# Patient Record
Sex: Female | Born: 1980 | Race: Black or African American | Hispanic: No | Marital: Single | Smoking: Never smoker
Health system: Southern US, Community
[De-identification: ages and names within clinical notes are randomized; demographics above are authoritative.]

## PROBLEM LIST (undated history)

## (undated) ENCOUNTER — Emergency Department (HOSPITAL_COMMUNITY): Admission: EM | Source: Home / Self Care | Attending: Emergency Medicine | Admitting: Emergency Medicine

## (undated) DIAGNOSIS — R7303 Prediabetes: Secondary | ICD-10-CM

## (undated) DIAGNOSIS — D649 Anemia, unspecified: Secondary | ICD-10-CM

## (undated) DIAGNOSIS — R87619 Unspecified abnormal cytological findings in specimens from cervix uteri: Secondary | ICD-10-CM

## (undated) DIAGNOSIS — K829 Disease of gallbladder, unspecified: Secondary | ICD-10-CM

## (undated) DIAGNOSIS — R32 Unspecified urinary incontinence: Secondary | ICD-10-CM

## (undated) DIAGNOSIS — R0602 Shortness of breath: Secondary | ICD-10-CM

## (undated) DIAGNOSIS — E559 Vitamin D deficiency, unspecified: Secondary | ICD-10-CM

## (undated) DIAGNOSIS — IMO0002 Reserved for concepts with insufficient information to code with codable children: Secondary | ICD-10-CM

## (undated) DIAGNOSIS — L0591 Pilonidal cyst without abscess: Secondary | ICD-10-CM

## (undated) HISTORY — DX: Unspecified urinary incontinence: R32

## (undated) HISTORY — DX: Vitamin D deficiency, unspecified: E55.9

## (undated) HISTORY — DX: Reserved for concepts with insufficient information to code with codable children: IMO0002

## (undated) HISTORY — DX: Anemia, unspecified: D64.9

## (undated) HISTORY — DX: Shortness of breath: R06.02

## (undated) HISTORY — DX: Disease of gallbladder, unspecified: K82.9

## (undated) HISTORY — DX: Pilonidal cyst without abscess: L05.91

## (undated) HISTORY — PX: LEEP: SHX91

## (undated) HISTORY — DX: Prediabetes: R73.03

## (undated) HISTORY — DX: Unspecified abnormal cytological findings in specimens from cervix uteri: R87.619

---
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
## 2000-09-02 ENCOUNTER — Other Ambulatory Visit: Admission: RE | Admit: 2000-09-02 | Discharge: 2000-09-02 | Payer: Self-pay | Admitting: Obstetrics and Gynecology

## 2001-05-31 ENCOUNTER — Ambulatory Visit (HOSPITAL_COMMUNITY): Admission: RE | Admit: 2001-05-31 | Discharge: 2001-05-31 | Payer: Self-pay | Admitting: Family Medicine

## 2001-05-31 ENCOUNTER — Encounter: Payer: Self-pay | Admitting: Family Medicine

## 2005-02-17 ENCOUNTER — Other Ambulatory Visit: Admission: RE | Admit: 2005-02-17 | Discharge: 2005-02-17 | Payer: Self-pay | Admitting: *Deleted

## 2005-06-14 ENCOUNTER — Emergency Department (HOSPITAL_COMMUNITY): Admission: EM | Admit: 2005-06-14 | Discharge: 2005-06-14 | Payer: Self-pay | Admitting: Emergency Medicine

## 2005-06-17 ENCOUNTER — Ambulatory Visit (HOSPITAL_COMMUNITY): Admission: RE | Admit: 2005-06-17 | Discharge: 2005-06-17 | Payer: Self-pay | Admitting: Family Medicine

## 2005-06-24 ENCOUNTER — Ambulatory Visit (HOSPITAL_COMMUNITY): Admission: RE | Admit: 2005-06-24 | Discharge: 2005-06-24 | Payer: Self-pay | Admitting: Family Medicine

## 2006-02-02 ENCOUNTER — Other Ambulatory Visit: Admission: RE | Admit: 2006-02-02 | Discharge: 2006-02-02 | Payer: Self-pay | Admitting: Obstetrics & Gynecology

## 2006-05-18 ENCOUNTER — Ambulatory Visit (HOSPITAL_COMMUNITY): Admission: RE | Admit: 2006-05-18 | Discharge: 2006-05-18 | Payer: Self-pay | Admitting: Family Medicine

## 2006-08-19 ENCOUNTER — Other Ambulatory Visit: Admission: RE | Admit: 2006-08-19 | Discharge: 2006-08-19 | Payer: Self-pay | Admitting: Obstetrics & Gynecology

## 2007-02-04 ENCOUNTER — Other Ambulatory Visit: Admission: RE | Admit: 2007-02-04 | Discharge: 2007-02-04 | Payer: Self-pay | Admitting: Obstetrics & Gynecology

## 2007-04-04 ENCOUNTER — Emergency Department (HOSPITAL_COMMUNITY): Admission: EM | Admit: 2007-04-04 | Discharge: 2007-04-04 | Payer: Self-pay | Admitting: Emergency Medicine

## 2007-08-04 IMAGING — CT CT PELVIS W/ CM
1 of 3 series · 13 of 32 positions shown, 19 images · IV contrast (CONTRAST)
Comparison: There are no prior studies available for comparison.

CLINICAL DATA: Pain in the right lower quadrant and mid abdomen, please evaluate.  
 ABDOMEN CT WITH CONTRAST:
TECHNIQUE: Multidetector CT imaging of the abdomen was performed following the standard protocol during bolus administration of intravenous contrast.
 Contrast: 100 cc Omnipaque 300.
TECHNIQUE: Multidetector CT imaging of the pelvis was performed following the standard protocol during bolus administration of intravenous contrast.

[Series 4890: — · axial · 0.53mm/px · z∈[+1342,+1708]mm · 13 of 87 slices shown, 19 images]
[im 7/87  soft-tissue]
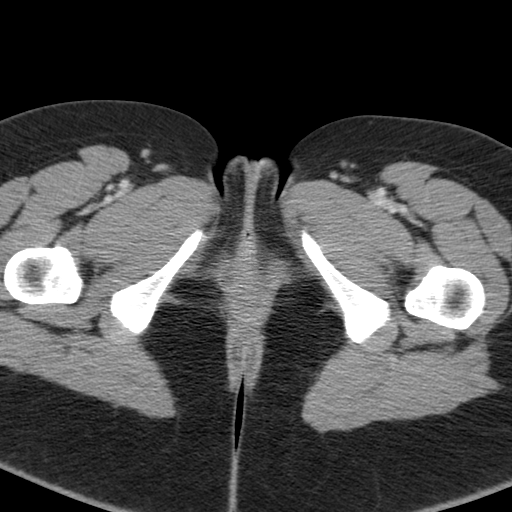
[im 7/87  bone]
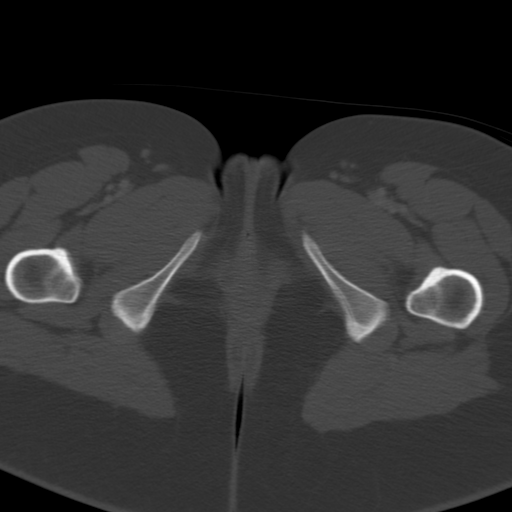
[im 13/87  soft-tissue]
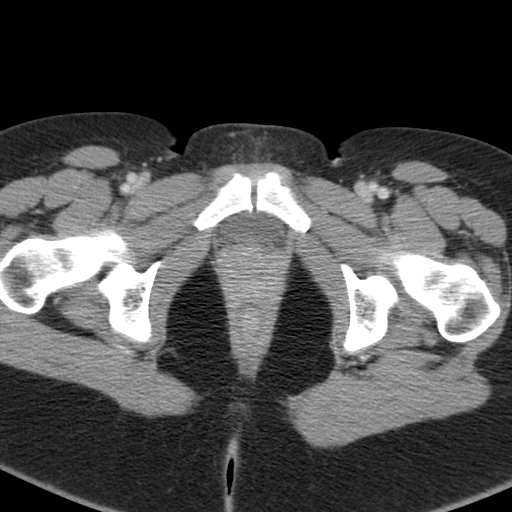
[im 19/87  soft-tissue]
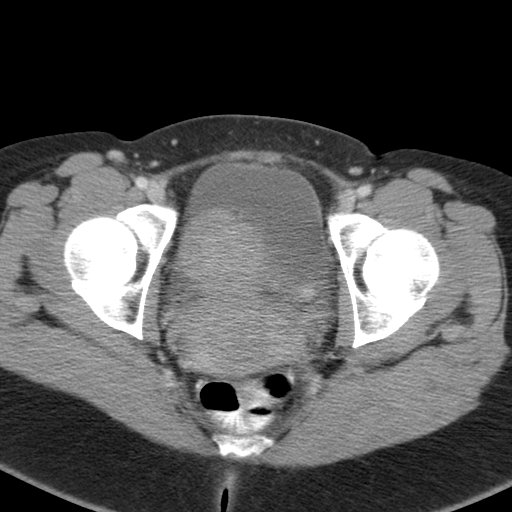
[im 25/87  soft-tissue]
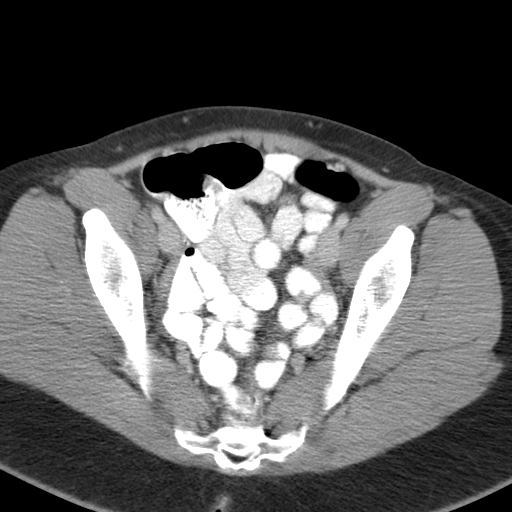
[im 31/87  soft-tissue]
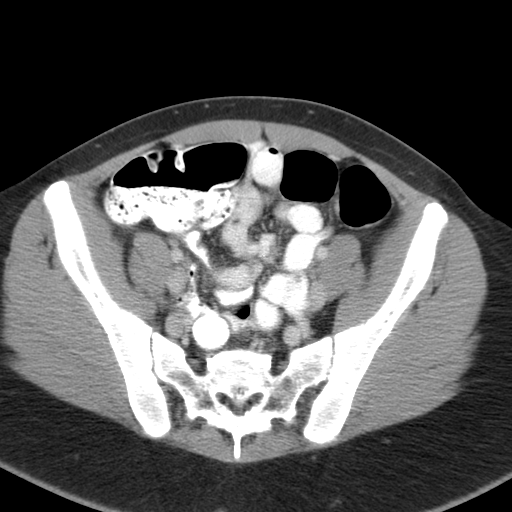
[im 37/87  soft-tissue]
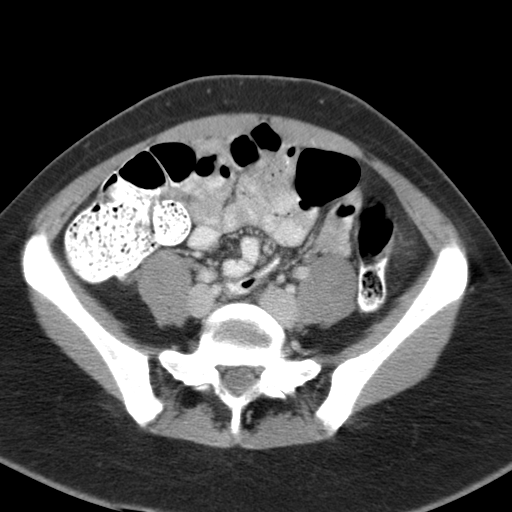
[im 44/87  soft-tissue]
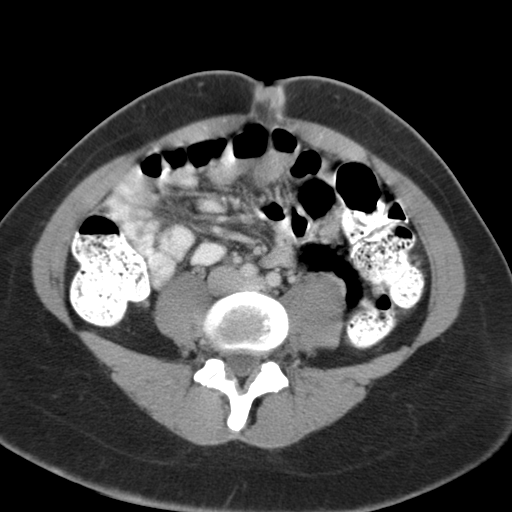
[im 50/87  soft-tissue]
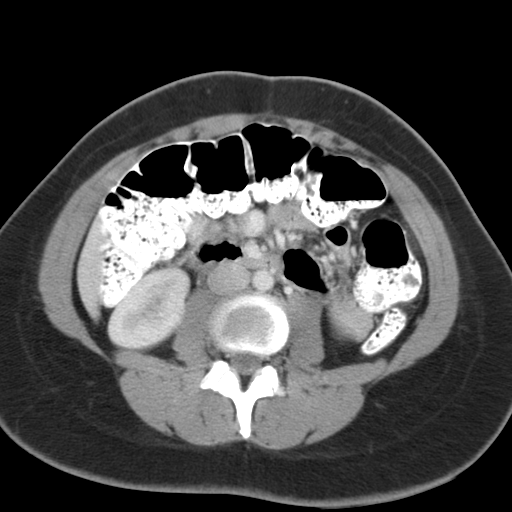
[im 56/87  soft-tissue]
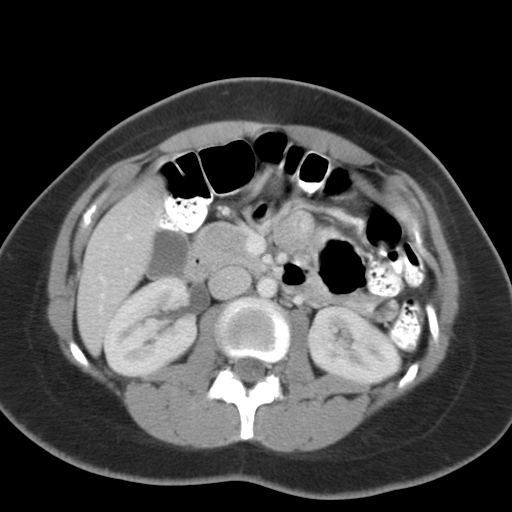
[im 56/87  bone]
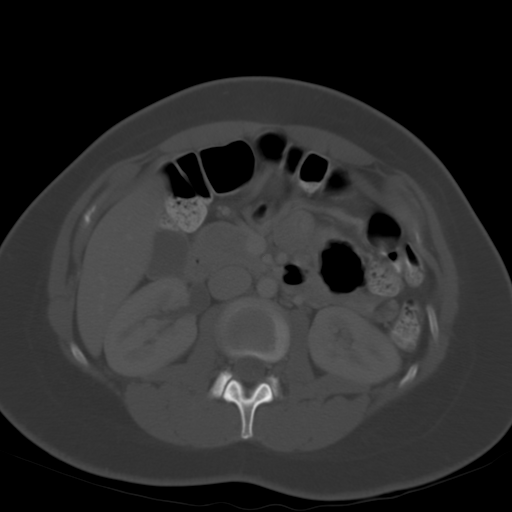
[im 62/87  soft-tissue]
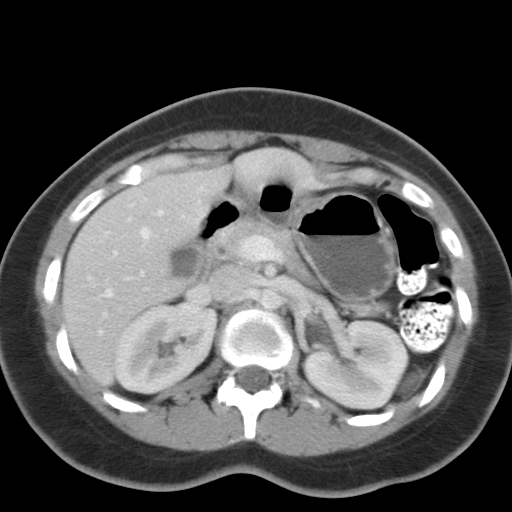
[im 62/87  lung]
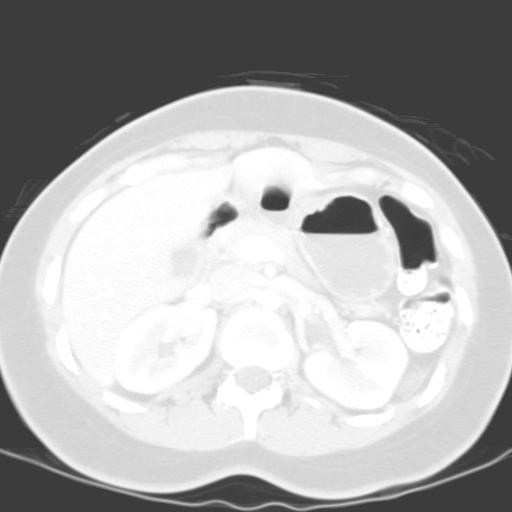
[im 68/87  soft-tissue]
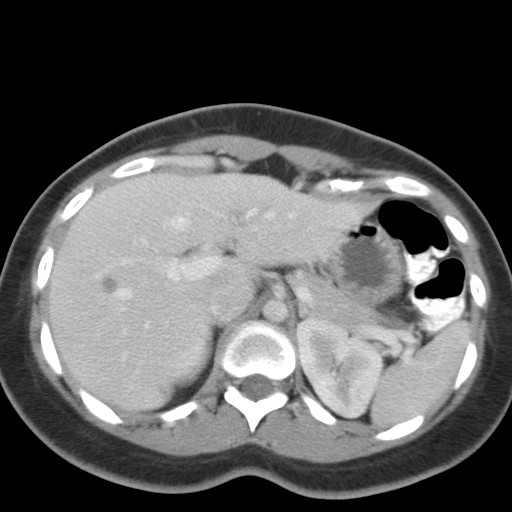
[im 68/87  lung]
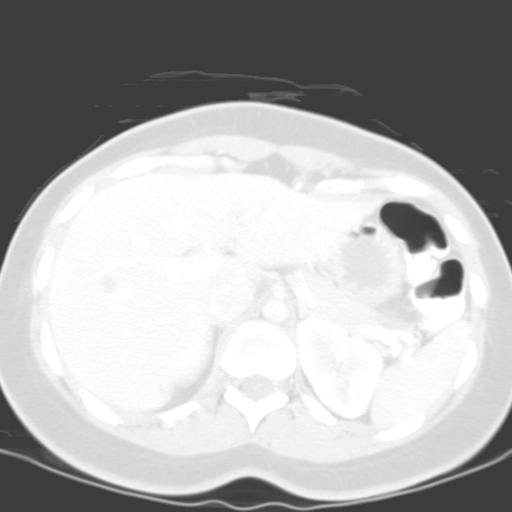
[im 74/87  soft-tissue]
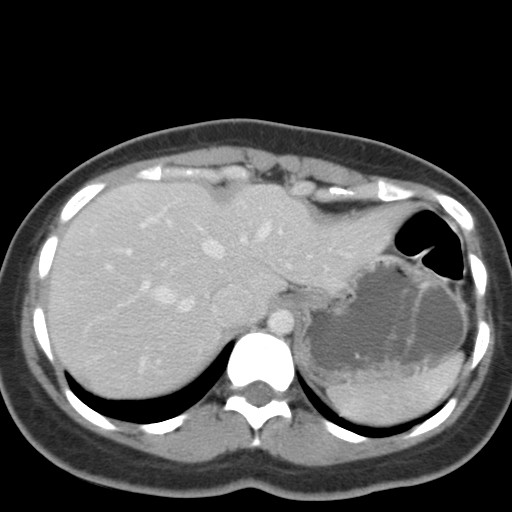
[im 74/87  lung]
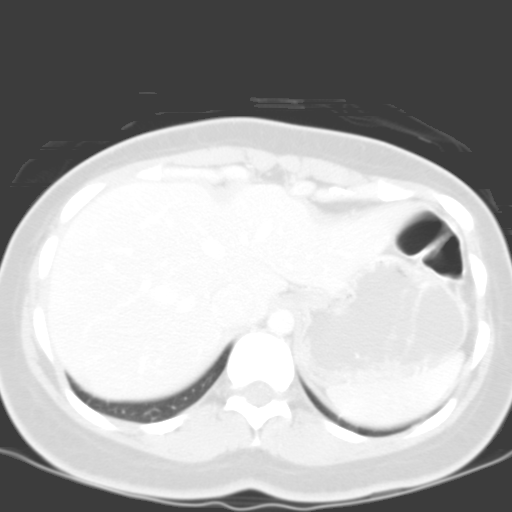
[im 80/87  soft-tissue]
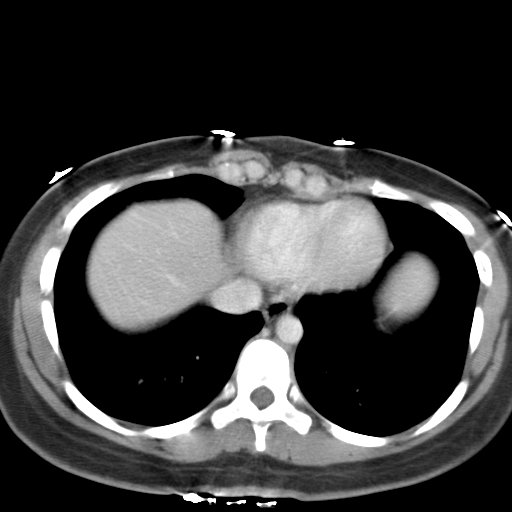
[im 80/87  lung]
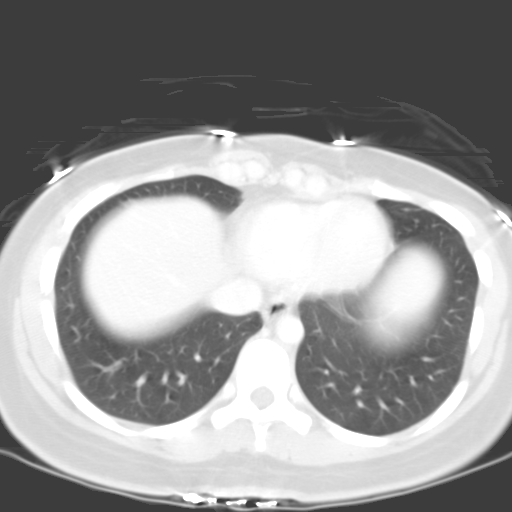

[13 of 32 positions shown; findings below may reference images not displayed]

FINDINGS: There is a circumscribed hypodense lesion associated with the right lobe of the liver, measuring 8 mm in diameter on image #20.  This is too small for accurate density measurements with partial volume averaging present.  Ultrasound of the liver may be helpful to determine if this represents a cyst.  There are no other liver lesions.  The spleen, pancreas, kidneys, and adrenal glands have a normal appearance.  There is no retroperitoneal adenopathy and the abdominal aorta is normal in size.
IMPRESSION: 8 mm in size hypodense lesion within the right lobe of the liver, which is incompletely evaluated on this study.  Ultrasound of the liver may be helpful to determine if this represents a cyst.  Otherwise, negative CT abdomen with IV contrast.
 PELVIS CT WITH CONTRAST:
FINDINGS: The appendix is visualized and fills with contrast and there is no evidence for appendicitis.  There is no pelvic mass or adenopathy and there is no significant free pelvic fluid.  There are no findings to account for the patient?s abdominal pain.  There is no evidence by CT scan for Crohn?s disease.
IMPRESSION: Negative CT of the pelvis with IV contrast.

## 2008-02-10 ENCOUNTER — Other Ambulatory Visit: Admission: RE | Admit: 2008-02-10 | Discharge: 2008-02-10 | Payer: Self-pay | Admitting: Obstetrics & Gynecology

## 2008-06-14 ENCOUNTER — Emergency Department (HOSPITAL_COMMUNITY): Admission: EM | Admit: 2008-06-14 | Discharge: 2008-06-14 | Payer: Self-pay | Admitting: Emergency Medicine

## 2009-05-23 ENCOUNTER — Emergency Department (HOSPITAL_COMMUNITY): Admission: EM | Admit: 2009-05-23 | Discharge: 2009-05-23 | Payer: Self-pay | Admitting: Emergency Medicine

## 2010-01-02 ENCOUNTER — Emergency Department (HOSPITAL_COMMUNITY)
Admission: EM | Admit: 2010-01-02 | Discharge: 2010-01-03 | Payer: Self-pay | Source: Home / Self Care | Admitting: Emergency Medicine

## 2010-03-18 LAB — DIFFERENTIAL
Eosinophils Absolute: 0.2 10*3/uL (ref 0.0–0.7)
Eosinophils Relative: 1 % (ref 0–5)
Lymphocytes Relative: 23 % (ref 12–46)
Lymphs Abs: 2.7 10*3/uL (ref 0.7–4.0)
Monocytes Absolute: 0.7 10*3/uL (ref 0.1–1.0)
Monocytes Relative: 6 % (ref 3–12)

## 2010-03-18 LAB — LIPASE, BLOOD: Lipase: 20 U/L (ref 11–59)

## 2010-03-18 LAB — D-DIMER, QUANTITATIVE: D-Dimer, Quant: 0.23 ug/mL-FEU (ref 0.00–0.48)

## 2010-03-18 LAB — CBC
Hemoglobin: 11.4 g/dL — ABNORMAL LOW (ref 12.0–15.0)
MCH: 29.5 pg (ref 26.0–34.0)
MCHC: 33.8 g/dL (ref 30.0–36.0)
MCV: 87.3 fL (ref 78.0–100.0)
WBC: 11.9 10*3/uL — ABNORMAL HIGH (ref 4.0–10.5)

## 2010-03-18 LAB — COMPREHENSIVE METABOLIC PANEL
BUN: 7 mg/dL (ref 6–23)
Glucose, Bld: 92 mg/dL (ref 70–99)
Potassium: 3.8 mEq/L (ref 3.5–5.1)
Sodium: 141 mEq/L (ref 135–145)
Total Protein: 7.4 g/dL (ref 6.0–8.3)

## 2010-03-25 LAB — URINALYSIS, ROUTINE W REFLEX MICROSCOPIC
Bilirubin Urine: NEGATIVE
Ketones, ur: NEGATIVE mg/dL
Nitrite: NEGATIVE
Protein, ur: NEGATIVE mg/dL

## 2010-03-25 LAB — WET PREP, GENITAL
Trich, Wet Prep: NONE SEEN
Yeast Wet Prep HPF POC: NONE SEEN

## 2010-03-25 LAB — URINE MICROSCOPIC-ADD ON

## 2010-03-25 LAB — POCT PREGNANCY, URINE: Preg Test, Ur: NEGATIVE

## 2010-03-25 LAB — GC/CHLAMYDIA PROBE AMP, GENITAL: Chlamydia, DNA Probe: NEGATIVE

## 2010-04-15 LAB — URINALYSIS, ROUTINE W REFLEX MICROSCOPIC
Leukocytes, UA: NEGATIVE
Protein, ur: NEGATIVE mg/dL
Specific Gravity, Urine: 1.02 (ref 1.005–1.030)
Urobilinogen, UA: 0.2 mg/dL (ref 0.0–1.0)
pH: 7 (ref 5.0–8.0)

## 2010-04-15 LAB — BASIC METABOLIC PANEL
CO2: 28 mEq/L (ref 19–32)
Chloride: 107 mEq/L (ref 96–112)
Creatinine, Ser: 0.65 mg/dL (ref 0.4–1.2)
GFR calc Af Amer: >60 mL/min (ref >60)
GFR calc non Af Amer: >60 mL/min (ref >60)

## 2010-04-15 LAB — PREGNANCY, URINE: Preg Test, Ur: NEGATIVE

## 2010-04-15 LAB — DIFFERENTIAL
Eosinophils Absolute: 0.1 10*3/uL (ref 0.0–0.7)
Eosinophils Relative: 1 % (ref 0–5)
Monocytes Relative: 7 % (ref 3–12)

## 2010-04-15 LAB — URINE MICROSCOPIC-ADD ON

## 2010-04-15 LAB — CBC
HCT: 33 % — ABNORMAL LOW (ref 36.0–46.0)
WBC: 9.2 10*3/uL (ref 4.0–10.5)

## 2010-09-30 LAB — CULTURE, ROUTINE-ABSCESS

## 2011-04-19 ENCOUNTER — Encounter (HOSPITAL_COMMUNITY): Payer: Self-pay | Admitting: *Deleted

## 2011-04-19 ENCOUNTER — Emergency Department (HOSPITAL_COMMUNITY): Payer: Self-pay

## 2011-04-19 ENCOUNTER — Emergency Department (HOSPITAL_COMMUNITY)
Admission: EM | Admit: 2011-04-19 | Discharge: 2011-04-19 | Disposition: A | Discharge status: Home / Self Care | Payer: Self-pay | Attending: Emergency Medicine | Admitting: Emergency Medicine

## 2011-04-19 DIAGNOSIS — R110 Nausea: Secondary | ICD-10-CM | POA: Insufficient documentation

## 2011-04-19 DIAGNOSIS — R079 Chest pain, unspecified: Secondary | ICD-10-CM | POA: Insufficient documentation

## 2011-04-19 LAB — CBC
HCT: 33.7 % — ABNORMAL LOW (ref 36.0–46.0)
Hemoglobin: 11.2 g/dL — ABNORMAL LOW (ref 12.0–15.0)
MCV: 90.8 fL (ref 78.0–100.0)
RBC: 3.71 MIL/uL — ABNORMAL LOW (ref 3.87–5.11)
WBC: 10 10*3/uL (ref 4.0–10.5)

## 2011-04-19 LAB — DIFFERENTIAL
Eosinophils Relative: 3 % (ref 0–5)
Lymphocytes Relative: 22 % (ref 12–46)
Lymphs Abs: 2.2 10*3/uL (ref 0.7–4.0)
Monocytes Absolute: 0.7 10*3/uL (ref 0.1–1.0)
Monocytes Relative: 7 % (ref 3–12)

## 2011-04-19 LAB — BASIC METABOLIC PANEL
CO2: 28 mEq/L (ref 19–32)
Calcium: 9.8 mg/dL (ref 8.4–10.5)
Creatinine, Ser: 0.56 mg/dL (ref 0.50–1.10)
Glucose, Bld: 87 mg/dL (ref 70–99)

## 2011-04-19 MED ORDER — GI COCKTAIL ~~LOC~~
30.0000 mL | Freq: Once | ORAL | Status: AC
  Administered 2011-04-19: 30 mL via ORAL
  Filled 2011-04-19: qty 30

## 2011-04-19 MED ORDER — IBUPROFEN 800 MG PO TABS
800.0000 mg | ORAL_TABLET | Freq: Once | ORAL | Status: AC
  Administered 2011-04-19: 800 mg via ORAL
  Filled 2011-04-19: qty 1

## 2011-04-19 MED ORDER — OMEPRAZOLE 20 MG PO CPDR: 20.0000 mg | DELAYED_RELEASE_CAPSULE | Freq: Every day | ORAL | Status: DC

## 2011-04-19 NOTE — ED Notes (Signed)
Pt c/o intermittent sharp left chest pain since yesterday. Also c/o nausea. Pt states that the pain is worse with cough or deep breathing. Denies shortness of breath.

## 2011-04-19 NOTE — Discharge Instructions (Signed)
Chest Pain (Nonspecific) There is no evidence of heart attack or blood clot in the lung.  Follow up with your doctor this week. Return to the ED if you develop new or worsening symptoms. Chest pain has many causes. Your pain could be caused by something serious, such as a heart attack or a blood clot in the lungs. It could also be caused by something less serious, such as a chest bruise or a virus. Follow up with your doctor. More lab tests or other studies may be needed to find the cause of your pain. Most of the time, nonspecific chest pain will improve within 2 to 3 days of rest and mild pain medicine. HOME CARE  For chest bruises, you may put ice on the sore area for 15 to 20 minutes, 3 to 4 times a day. Do this only if it makes you or your child feel better.   Put ice in a plastic bag.   Place a towel between the skin and the bag.   Rest for the next 2 to 3 days.   Go back to work if the pain improves.   See your doctor if the pain lasts longer than 1 to 2 weeks.   Only take medicine as told by your doctor.   Quit smoking if you smoke.  GET HELP RIGHT AWAY IF:   There is more pain or pain that spreads to the arm, neck, jaw, back, or belly (abdomen).   You or your child has shortness of breath.   You or your child coughs more than usual or coughs up blood.   You or your child has very bad back or belly pain, feels sick to his or her stomach (nauseous), or throws up (vomits).   You or your child has very bad weakness.   You or your child passes out (faints).   You or your child has a temperature by mouth above 102 F (38.9 C), not controlled by medicine.  Any of these problems may be serious and may be an emergency. Do not wait to see if the problems will go away. Get medical help right away. Call your local emergency services 911 in [place] Do not drive yourself to the hospital. MAKE SURE YOU:   Understand these instructions.   Will watch this condition.   Will get help  right away if you or your child is not doing well or gets worse.  Document Released: 06/11/2007 Document Revised: 12/12/2010 Document Reviewed: 06/11/2007 [facility] Patient Information 2012 [place], [place].Chest Pain (Nonspecific) Chest pain has many causes. Your pain could be caused by something serious, such as a heart attack or a blood clot in the lungs. It could also be caused by something less serious, such as a chest bruise or a virus. Follow up with your doctor. More lab tests or other studies may be needed to find the cause of your pain. Most of the time, nonspecific chest pain will improve within 2 to 3 days of rest and mild pain medicine. HOME CARE  For chest bruises, you may put ice on the sore area for 15 to 20 minutes, 3 to 4 times a day. Do this only if it makes you or your child feel better.   Put ice in a plastic bag.   Place a towel between the skin and the bag.   Rest for the next 2 to 3 days.   Go back to work if the pain improves.   See your doctor if  the pain lasts longer than 1 to 2 weeks.   Only take medicine as told by your doctor.   Quit smoking if you smoke.  GET HELP RIGHT AWAY IF:   There is more pain or pain that spreads to the arm, neck, jaw, back, or belly (abdomen).   You or your child has shortness of breath.   You or your child coughs more than usual or coughs up blood.   You or your child has very bad back or belly pain, feels sick to his or her stomach (nauseous), or throws up (vomits).   You or your child has very bad weakness.   You or your child passes out (faints).   You or your child has a temperature by mouth above 102 F (38.9 C), not controlled by medicine.  Any of these problems may be serious and may be an emergency. Do not wait to see if the problems will go away. Get medical help right away. Call your local emergency services 911 in [place] Do not drive yourself to the hospital. MAKE SURE YOU:   Understand these instructions.    Will watch this condition.   Will get help right away if you or your child is not doing well or gets worse.  Document Released: 06/11/2007 Document Revised: 12/12/2010 Document Reviewed: 06/11/2007 [facility] Patient Information 2012 [place], [place].

## 2011-04-19 NOTE — ED Notes (Signed)
Patient transported to X-ray 

## 2011-04-19 NOTE — ED Notes (Signed)
MD at bedside. 

## 2011-04-19 NOTE — ED Provider Notes (Signed)
This chart was scribed for Glynn Octave, MD by Wallis Mart. The patient was seen in room APA08/APA08 and the patient's care was started at 2:37 PM.   CSN: [id number]  Arrival date & time 04/19/11  1421   First MD Initiated Contact with Patient 04/19/11 1434      Chief Complaint  Patient presents with  . Chest Pain    (Consider location/radiation/quality/duration/timing/severity/associated sxs/prior treatment) HPI  Katie Curtis is a 31 y.o. female who presents to the Emergency Department complaining of sudden onset, persistence of constant, gradually worsening, moderate Left sided Chest pain onset yesterday. The severity of the pain fluctuates. The pain radiates to her left arm and back. Pt had a similar episode several years ago and had inflamation of ribs and cartilage. Pt w/ no h/o heart problems, denies pain or swelling in legs, denies cough, fever.  Pt states that lying back and lying on her side worsens the pain and sitting up helps. Breathing deeply also worsens the pain. Pt c/o associated nausea. Denies any recent illness. There are no other associated symptoms and no other alleviating or aggravating factors. Pt is not on birth control. History reviewed. No pertinent past medical history.  History reviewed. No pertinent past surgical history.  History reviewed. No pertinent family history.  History  Substance Use Topics  . Smoking status: Never Smoker   . Smokeless tobacco: Not on file  . Alcohol Use: Yes     occasionally    OB History    Grav Para Term Preterm Abortions TAB SAB Ect Mult Living                  Review of Systems 10 Systems reviewed and all are negative for acute change except as noted in the HPI.   Allergies  Hydrocodone; Oxycodone; and Penicillins  Home Medications   Current Outpatient Rx  Name Route Sig Dispense Refill  . OMEPRAZOLE 20 MG PO CPDR Oral Take 1 capsule (20 mg total) by mouth daily. 30 capsule 0    BP 122/75  Pulse  78  Temp(Src) 98.5 F (36.9 C) (Oral)  Resp 20  Ht 5' 3.5" (1.613 m)  Wt 160 lb (72.576 kg)  BMI 27.90 kg/m2  SpO2 100%  LMP 03/29/2011  Physical Exam  Nursing note and vitals reviewed. Constitutional: She is oriented to person, place, and time. She appears well-developed and well-nourished. No distress.  HENT:  Head: Normocephalic and atraumatic.  Eyes: EOM are normal. Pupils are equal, round, and reactive to light.  Neck: Neck supple. No tracheal deviation present.  Cardiovascular: Normal rate and regular rhythm.   Pulmonary/Chest: Effort normal. No respiratory distress.       Chest pain not reproducible   Abdominal: Soft. She exhibits no distension.  Musculoskeletal: Normal range of motion. She exhibits no edema.  Neurological: She is alert and oriented to person, place, and time. No sensory deficit.       5/5 strength throughout, equal grip strength upper extremities.   Skin: Skin is warm and dry.  Psychiatric: She has a normal mood and affect. Her behavior is normal.    ED Course  Procedures (including critical care time) DIAGNOSTIC STUDIES: Oxygen Saturation is 100% on room air, normal by my interpretation.    COORDINATION OF CARE:  2:46 PM: Pt evaluated, physical examination complete.    Labs Reviewed  CBC - Abnormal; Notable for the following:    RBC 3.71 (*)    Hemoglobin 11.2 (*)  HCT 33.7 (*)    All other components within normal limits  DIFFERENTIAL  BASIC METABOLIC PANEL  D-DIMER, QUANTITATIVE  TROPONIN I   Dg Chest 2 View  04/19/2011  *RADIOLOGY REPORT*  Clinical Data: Chest pain  CHEST - 2 VIEW  Comparison: Chest radiograph 12/25/2009  Findings: Normal mediastinum and cardiac silhouette.  Normal pulmonary  vasculature.  No evidence of effusion, infiltrate, or pneumothorax.  No acute bony abnormality.  IMPRESSION: No acute cardiopulmonary process.  Original Report Authenticated By: Genevive Bi, M.D.     1. Chest pain       MDM    Intermittent but constant left-sided chest pain since yesterday associated with nausea, worse with coughing and deep breathing. No shortness of breath. No radiation of pain. Air travel 3 weeks ago.  CP not reproducible. History and exam not consistent with ACS.  CXR, GI cocktail, d-dimer.  D-dimer negative. EKG nonischemic. Chest pain has resolved with GI cocktail and Motrin. We'll empirically start PPI. Stable for outpatient followup with PCP   Date: 04/19/2011  Rate: 88  Rhythm: normal sinus rhythm  QRS Axis: normal  Intervals: normal  ST/T Wave abnormalities: normal  Conduction Disutrbances:none  Narrative Interpretation:   Old EKG Reviewed: unchanged    I personally performed the services described in this documentation, which was scribed in my presence.  The recorded information has been reviewed and considered.        Glynn Octave, MD 04/19/11 1600

## 2012-09-02 ENCOUNTER — Ambulatory Visit (INDEPENDENT_AMBULATORY_CARE_PROVIDER_SITE_OTHER): Payer: BC Managed Care – PPO | Admitting: Obstetrics & Gynecology

## 2012-09-02 ENCOUNTER — Encounter: Payer: Self-pay | Admitting: Obstetrics & Gynecology

## 2012-09-02 VITALS — BP 114/70 | HR 64 | Resp 16 | Ht 63.5 in | Wt 171.8 lb

## 2012-09-02 DIAGNOSIS — Z124 Encounter for screening for malignant neoplasm of cervix: Secondary | ICD-10-CM

## 2012-09-02 DIAGNOSIS — Z0000 Encounter for general adult medical examination without abnormal findings: Secondary | ICD-10-CM

## 2012-09-02 DIAGNOSIS — Z01419 Encounter for gynecological examination (general) (routine) without abnormal findings: Secondary | ICD-10-CM

## 2012-09-02 DIAGNOSIS — Z23 Encounter for immunization: Secondary | ICD-10-CM

## 2012-09-02 LAB — POCT URINALYSIS DIPSTICK
Bilirubin, UA: NEGATIVE
Ketones, UA: NEGATIVE
Leukocytes, UA: NEGATIVE
Nitrite, UA: NEGATIVE
Protein, UA: NEGATIVE

## 2012-09-02 MED ORDER — PHENTERMINE HCL 15 MG PO CAPS: 15.0000 mg | ORAL_CAPSULE | ORAL | Status: DC

## 2012-09-02 NOTE — Progress Notes (Signed)
32 y.o. G1P1 SingleAfrican AmericanF here for annual exam.  Cycles regular and light.  Lasts 3-4 days with spotting for another three days.  Not SA.  Grandmother died this summer.  Patient and her son helped care for her.  They didn't take any big trips this summer.  He will be 16 on Dec 22nd.  Doing drivers Ed right now.  Patient's last menstrual period was 08/11/2012.          Sexually active: no  The current method of family planning is none.    Exercising: no  some Smoker:  no  Health Maintenance: Pap:  08/21/11 WNL History of abnormal Pap:  yes MMG:  none Colonoscopy:  none BMD:   none TDaP:  2004 Screening Labs: none done, Hb today: 12.2, Urine today: negative   reports that she has never smoked. She has never used smokeless tobacco. She reports that she does not drink alcohol or use illicit drugs.  Past Medical History  Diagnosis Date  . Anemia   . Abnormal Pap smear   . Pilonidal cyst     Past Surgical History  Procedure Laterality Date  . Leep      CIN II    Current Outpatient Prescriptions  Medication Sig Dispense Refill  . Multiple Vitamins-Calcium (ONE-A-DAY WOMENS PO) Take by mouth. For energy daily      . clobetasol ointment (TEMOVATE) 0.05 % Apply topically. Apply twice daily as needed       No current facility-administered medications for this visit.    Family History  Problem Relation Age of Onset  . Lung cancer Paternal Grandmother     smoker  . Hypertension Father   . Hypertension Paternal Grandmother   . Hypertension Paternal Grandfather   . Congestive Heart Failure Father     had been on dialysis x 10 years  . Kidney failure Paternal Grandfather     ROS:  Pertinent items are noted in HPI.  Otherwise, a comprehensive ROS was negative.  Exam:   BP 114/70  Pulse 64  Resp 16  Ht 5' 3.5" (1.613 m)  Wt 171 lb 12.8 oz (77.928 kg)  BMI 29.95 kg/m2  LMP 08/11/2012  Weight change: +9lbs  Height: 5' 3.5" (161.3 cm)  Ht Readings from Last 3  Encounters:  09/02/12 5' 3.5" (1.613 m)  04/19/11 5' 3.5" (1.613 m)    General appearance: alert, cooperative and appears stated age Head: Normocephalic, without obvious abnormality, atraumatic Neck: no adenopathy, supple, symmetrical, trachea midline and thyroid normal to inspection and palpation Lungs: clear to auscultation bilaterally Breasts: normal appearance, no masses or tenderness Heart: regular rate and rhythm Abdomen: soft, non-tender; bowel sounds normal; no masses,  no organomegaly Extremities: extremities normal, atraumatic, no cyanosis or edema Skin: Skin color, texture, turgor normal. No rashes or lesions Lymph nodes: Cervical, supraclavicular, and axillary nodes normal. No abnormal inguinal nodes palpated Neurologic: Grossly normal   Pelvic: External genitalia:  no lesions              Urethra:  normal appearing urethra with no masses, tenderness or lesions              Bartholins and Skenes: normal                 Vagina: normal appearing vagina with normal color and discharge, no lesions              Cervix: no lesions  Pap taken: yes Bimanual Exam:  Uterus:  normal size, contour, position, consistency, mobility, non-tender              Adnexa: normal adnexa and no mass, fullness, tenderness               Rectovaginal: Confirms               Anus:  normal sphincter tone, no lesions  A:  Well Woman with normal exam Weight gain 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  #30/1RF. Tdap today. TSH. return annually or prn  An After Visit Summary was printed and given to the patient.

## 2012-09-02 NOTE — Addendum Note (Signed)
Addended by: Elisha Headland on: 09/02/2012 05:08 PM   Modules accepted: Orders

## 2012-09-02 NOTE — Patient Instructions (Signed)

## 2012-09-03 LAB — TSH: TSH: 0.763 u[IU]/mL (ref 0.350–4.500)

## 2012-09-03 LAB — HEMOGLOBIN, FINGERSTICK: Hemoglobin, fingerstick: 12.2 g/dL (ref 12.0–16.0)

## 2012-10-04 ENCOUNTER — Ambulatory Visit (INDEPENDENT_AMBULATORY_CARE_PROVIDER_SITE_OTHER): Payer: BC Managed Care – PPO | Admitting: Obstetrics & Gynecology

## 2012-10-04 ENCOUNTER — Encounter: Payer: Self-pay | Admitting: Obstetrics & Gynecology

## 2012-10-04 VITALS — BP 122/72 | HR 60 | Resp 16 | Ht 63.5 in | Wt 168.0 lb

## 2012-10-04 DIAGNOSIS — R634 Abnormal weight loss: Secondary | ICD-10-CM

## 2012-10-04 MED ORDER — PHENTERMINE HCL 15 MG PO CAPS: 15.0000 mg | ORAL_CAPSULE | ORAL | Status: DC

## 2012-10-04 NOTE — Patient Instructions (Signed)
Please call if you have any new problems. 

## 2012-10-04 NOTE — Progress Notes (Signed)
32 y.o. Single African American female G1P1 here for follow up after startring phentermine.  Denies headaches.  Does feel that appetite is suppressed adequately.  Keeping calories between 1300 and 1500 a day.  She isn't really exercising.  Clothes do feel looser on patient.  Has a trail behind her house that she could walk on as well as a [employer]  O: Healthy WD,WN female Affect: normal  A: Desired weight loss  P: Continue phentermine 15mg  #30/1RF

## 2012-11-11 ENCOUNTER — Other Ambulatory Visit: Payer: Self-pay

## 2012-11-26 ENCOUNTER — Encounter: Payer: Self-pay | Admitting: Obstetrics & Gynecology

## 2012-12-10 ENCOUNTER — Ambulatory Visit: Payer: BC Managed Care – PPO | Admitting: Obstetrics & Gynecology

## 2012-12-28 ENCOUNTER — Telehealth: Payer: Self-pay | Admitting: Obstetrics & Gynecology

## 2012-12-28 ENCOUNTER — Ambulatory Visit: Payer: BC Managed Care – PPO | Admitting: Obstetrics & Gynecology

## 2012-12-28 NOTE — Telephone Encounter (Signed)
Patient dnka 2 mo follow up appointment today with Dr.Miller at 3:00. I left a message for the patient to call and reschedule.

## 2012-12-28 NOTE — Telephone Encounter (Signed)
Pt called to reschedule appointment for 2 month reck. This appointment was a DR/CX/RS and the patient states she was not aware of this appointment. Reschedule to 01/14/13 @2 :30 with Dr. Hyacinth Meeker.

## 2013-01-13 ENCOUNTER — Encounter: Payer: Self-pay | Admitting: Obstetrics & Gynecology

## 2013-01-14 ENCOUNTER — Ambulatory Visit (INDEPENDENT_AMBULATORY_CARE_PROVIDER_SITE_OTHER): Payer: BC Managed Care – PPO | Admitting: Obstetrics & Gynecology

## 2013-01-14 VITALS — BP 114/60 | HR 60 | Resp 16 | Ht 63.5 in | Wt 163.2 lb

## 2013-01-14 DIAGNOSIS — R634 Abnormal weight loss: Secondary | ICD-10-CM

## 2013-01-14 MED ORDER — PHENTERMINE HCL 15 MG PO CAPS: 15.0000 mg | ORAL_CAPSULE | ORAL | Status: DC

## 2013-01-14 NOTE — Progress Notes (Signed)
33 y.o. Single African American female G1P1 here for follow up after starting Phentermine.  She has lost 5 pounds but feels she is really loosing inches.  She is wearing a pair of jeans today that she is wearing today were too tight but now are loose and she is wearing a belt with them.  She feels it is helping her some with energy.  Being really careful with diet at breakfast and lunch and then eats what she wants for dinner.  No headache, no jitters, no trouble sleeping.  Feels really good on the dosage she is taking.  O: Healthy WD,WN female Affect: normal  A: Desired weight loss  P: Continue Phentermine 15mg  daily.  Rx given for two months.  Pt will follow up in 2 months.  ~10 minutes spent with patient >50% of time was in face to face discussion of above.

## 2013-01-22 ENCOUNTER — Encounter: Payer: Self-pay | Admitting: Obstetrics & Gynecology

## 2013-09-13 ENCOUNTER — Ambulatory Visit (INDEPENDENT_AMBULATORY_CARE_PROVIDER_SITE_OTHER): Payer: BC Managed Care – PPO | Admitting: Obstetrics & Gynecology

## 2013-09-13 ENCOUNTER — Encounter: Payer: Self-pay | Admitting: Obstetrics & Gynecology

## 2013-09-13 VITALS — BP 110/80 | HR 84 | Resp 18 | Ht 64.0 in | Wt 160.0 lb

## 2013-09-13 DIAGNOSIS — Z01419 Encounter for gynecological examination (general) (routine) without abnormal findings: Secondary | ICD-10-CM

## 2013-09-13 DIAGNOSIS — Z124 Encounter for screening for malignant neoplasm of cervix: Secondary | ICD-10-CM

## 2013-09-13 DIAGNOSIS — Z0000 Encounter for general adult medical examination without abnormal findings: Secondary | ICD-10-CM

## 2013-09-13 LAB — COMPREHENSIVE METABOLIC PANEL
ALBUMIN: 4.4 g/dL (ref 3.5–5.2)
ALK PHOS: 64 U/L (ref 39–117)
ALT: 8 U/L (ref 0–35)
AST: 11 U/L (ref 0–37)
BUN: 9 mg/dL (ref 6–23)
CO2: 26 mEq/L (ref 19–32)
Calcium: 9.7 mg/dL (ref 8.4–10.5)
Chloride: 107 mEq/L (ref 96–112)
Creat: 0.71 mg/dL (ref 0.50–1.10)
Glucose, Bld: 83 mg/dL (ref 70–99)
POTASSIUM: 3.5 meq/L (ref 3.5–5.3)
SODIUM: 141 meq/L (ref 135–145)
TOTAL PROTEIN: 7.3 g/dL (ref 6.0–8.3)
Total Bilirubin: 0.3 mg/dL (ref 0.2–1.2)

## 2013-09-13 LAB — POCT URINALYSIS DIPSTICK
BILIRUBIN UA: NEGATIVE
Blood, UA: NEGATIVE
Glucose, UA: NEGATIVE
KETONES UA: NEGATIVE
LEUKOCYTES UA: NEGATIVE
Nitrite, UA: NEGATIVE
PH UA: 5
Protein, UA: NEGATIVE
Urobilinogen, UA: NEGATIVE

## 2013-09-13 LAB — LIPID PANEL
Cholesterol: 168 mg/dL (ref 0–200)
HDL: 56 mg/dL (ref >39)
LDL CALC: 100 mg/dL — AB (ref 0–99)
Total CHOL/HDL Ratio: 3 Ratio
Triglycerides: 61 mg/dL (ref <150)
VLDL: 12 mg/dL (ref 0–40)

## 2013-09-13 LAB — HEMOGLOBIN, FINGERSTICK: HEMOGLOBIN, FINGERSTICK: 11.8 g/dL — AB (ref 12.0–16.0)

## 2013-09-13 NOTE — Progress Notes (Signed)
33 y.o. G1P1 SingleAfrican AmericanF here for annual exam.  Doing well.  Went to [employer] with her son, cousins and their families.  Had a really good time.  Cycles regular.    Patient's last menstrual period was 09/01/2013.          Sexually active: No.  The current method of family planning is none.    Exercising: Yes.    Walking Smoker:  no  Health Maintenance: Pap: 08/2012 Neg History of abnormal Pap:  Yes, LEEP 4/11, CIN2 TDaP: 08/2012 Screening Labs: today, Hb today: 11.8, Urine today: Clear   reports that she has never smoked. She has never used smokeless tobacco. She reports that she does not drink alcohol or use illicit drugs.  Past Medical History  Diagnosis Date  . Anemia   . Abnormal Pap smear   . Pilonidal cyst     Past Surgical History  Procedure Laterality Date  . Leep      CIN II  . Appendectomy      Current Outpatient Prescriptions  Medication Sig Dispense Refill  . clobetasol ointment (TEMOVATE) 0.05 % Apply topically. Apply twice daily as needed      . Multiple Vitamins-Calcium (ONE-A-DAY WOMENS PO) Take by mouth. For energy daily       No current facility-administered medications for this visit.    Family History  Problem Relation Age of Onset  . Lung cancer Paternal Grandmother     smoker  . Hypertension Father   . Hypertension Paternal Grandmother   . Hypertension Paternal Grandfather   . Congestive Heart Failure Father     had been on dialysis x 10 years  . Kidney failure Paternal Grandfather   . Stroke Maternal Grandmother   . Hypothyroidism Paternal Grandmother   . Kidney failure Paternal Grandfather     ROS:  Pertinent items are noted in HPI.  Otherwise, a comprehensive ROS was negative.  Exam:   BP 110/80  Pulse 84  Resp 18  Ht  (1.626 m)  Wt 160 lb (72.576 kg)  BMI 27.45 kg/m2  LMP 09/01/2013  Weight change:-8#   Height:  (162.6 cm)  Ht Readings from Last 3 Encounters:  09/13/13  (1.626 m)  01/14/13 5' 3.5"  (1.613 m)  10/04/12 5' 3.5" (1.613 m)    General appearance: alert, cooperative and appears stated age Head: Normocephalic, without obvious abnormality, atraumatic Neck: no adenopathy, supple, symmetrical, trachea midline and thyroid normal to inspection and palpation Lungs: clear to auscultation bilaterally Breasts: normal appearance, no masses or tenderness Heart: regular rate and rhythm Abdomen: soft, non-tender; bowel sounds normal; no masses,  no organomegaly Extremities: extremities normal, atraumatic, no cyanosis or edema Skin: Skin color, texture, turgor normal. No rashes or lesions Lymph nodes: Cervical, supraclavicular, and axillary nodes normal. No abnormal inguinal nodes palpated Neurologic: Grossly normal   Pelvic: External genitalia:  no lesions              Urethra:  normal appearing urethra with no masses, tenderness or lesions              Bartholins and Skenes: normal                 Vagina: normal appearing vagina with normal color and discharge, no lesions              Cervix: no lesions              Pap taken: Yes.   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  No HPV testing due to CIN II hx TSH 2014.  CMP, Lipids obtained today.  return annually or prn  An After Visit Summary was printed and given to the patient.

## 2013-09-16 LAB — IPS PAP SMEAR ONLY

## 2013-11-07 ENCOUNTER — Encounter: Payer: Self-pay | Admitting: Obstetrics & Gynecology

## 2014-01-02 ENCOUNTER — Telehealth: Payer: Self-pay | Admitting: Obstetrics & Gynecology

## 2014-01-02 NOTE — Telephone Encounter (Signed)
Spoke with patient. Patient states that she ran out of phentermine and would like to come in for consult with Dr.Miller to restart medication. Requesting early morning appointment or late afternoon appointment on Friday. Appointment scheduled for 01/27/14 at 3pm with Dr.Miller. Patient is agreeable to date and time. Would like to be on wait list if consult appointment opens but early in the morning before 01/27/14.  Cc: Katie CovertStarla Curtis   Routing to provider for final review. Patient agreeable to disposition. Will close encounter

## 2014-01-02 NOTE — Telephone Encounter (Signed)
Patient is requesting a consult with Dr.Miller regarding her "weight loss" medication.

## 2014-01-23 ENCOUNTER — Telehealth: Payer: Self-pay | Admitting: Obstetrics & Gynecology

## 2014-01-23 NOTE — Telephone Encounter (Signed)
Pt states the weather forecast is calling for inclement weather this Friday. Pt would like to see if there are any available spots before that day with Dr Hyacinth MeekerMiller.

## 2014-01-23 NOTE — Telephone Encounter (Signed)
Left message to call Shaquayla Klimas at [phone]. 

## 2014-01-24 NOTE — Telephone Encounter (Signed)
Patient advised there are no available openings with Dr. Hyacinth MeekerMiller before Friday at this time but she is welcome to check back.

## 2014-01-24 NOTE — Telephone Encounter (Signed)
Routing to Dr.Miller as Lorain ChildesFYI. Patient kept appointment for 1/22 at 3pm.  Routing to provider for final review. Patient agreeable to disposition. Will close encounter

## 2014-01-27 ENCOUNTER — Institutional Professional Consult (permissible substitution): Payer: BC Managed Care – PPO | Admitting: Obstetrics & Gynecology

## 2014-02-06 ENCOUNTER — Ambulatory Visit (INDEPENDENT_AMBULATORY_CARE_PROVIDER_SITE_OTHER): Payer: BC Managed Care – PPO | Admitting: Obstetrics & Gynecology

## 2014-02-06 ENCOUNTER — Encounter: Payer: Self-pay | Admitting: Obstetrics & Gynecology

## 2014-02-06 VITALS — BP 120/68 | HR 92 | Resp 18 | Ht 64.0 in | Wt 179.0 lb

## 2014-02-06 DIAGNOSIS — R635 Abnormal weight gain: Secondary | ICD-10-CM

## 2014-02-06 MED ORDER — PHENTERMINE HCL 15 MG PO CAPS: 15.0000 mg | ORAL_CAPSULE | ORAL | Status: DC

## 2014-02-06 NOTE — Progress Notes (Signed)
34 y.o. Single African American female G1P1 here for discussion of starting Phentermine again for weight loss.  Pt's weight is up to 179.  BP 120/68.  She had a nice holiday.  States son is 36 yo and is very stressful for her at times.    Pt took Phentermine over a year ago and did have some some success.  She states she is going to also start a 1500 calorie.  Going to keep a food diary.  Started a gym membership last Friday so I recommended 30 minutes at least three times weekly.  D/w pt side effects again.  Headache, nausea, elevated BP, hypertension, pulmonary hypertension risks discussed.  Pt voices clear understanding and desires to restart.    O: Healthy WD,WN female Affect: normal  A: Desired weight loss  P: Continue Phentermine 15mg  daily. Rx given for two months. Pt will follow up in 2 months.  ~15 minutes spent with patient >50% of time was in face to face discussion of above.

## 2014-04-07 ENCOUNTER — Ambulatory Visit (INDEPENDENT_AMBULATORY_CARE_PROVIDER_SITE_OTHER): Payer: BC Managed Care – PPO | Admitting: Obstetrics & Gynecology

## 2014-04-07 ENCOUNTER — Encounter: Payer: Self-pay | Admitting: Obstetrics & Gynecology

## 2014-04-07 VITALS — BP 128/80 | HR 64 | Resp 16 | Wt 176.2 lb

## 2014-04-07 DIAGNOSIS — R634 Abnormal weight loss: Secondary | ICD-10-CM

## 2014-04-07 MED ORDER — PHENTERMINE HCL 15 MG PO CAPS: 15.0000 mg | ORAL_CAPSULE | ORAL | Status: DC

## 2014-04-07 NOTE — Progress Notes (Signed)
Patient ID: Katie Curtis, female   DOB: 04-Feb-1980, 34 y.o.   MRN: [id number]  34 y.o. Single African American female G1P1 here for discussion of restarting Phentermine again for weight loss. She stopped a little after the last visit as she was just so busy and not exercising like she knows she should.  So, she felt being on the medication wasn't the right thing to do.  Her weight is down three pounds from the last visit.     Reviewed side effects again. Headache, nausea, elevated BP, hypertension, pulmonary hypertension risks discussed. Pt voices clear understanding and desires to restart.  Denies issues with insomnia in the past when she took this in the past.  O:  WNWD AAF, NAD Affect:  Normal  Assessment:  Desired continue weigh loss  Plan:  Restart phentermine 15mg .  #30/1RF.  Recheck 6-8 weeks.    ~10 minutes spent with patient >50% of time was in face to face discussion of above.

## 2014-06-22 ENCOUNTER — Telehealth: Payer: Self-pay | Admitting: Obstetrics & Gynecology

## 2014-06-22 MED ORDER — PHENTERMINE HCL 15 MG PO CAPS: 15.0000 mg | ORAL_CAPSULE | ORAL | Status: DC

## 2014-06-22 NOTE — Telephone Encounter (Signed)
Rx faxed to [facility] and patient is aware that rx has been sent to pharmacy.

## 2014-06-22 NOTE — Telephone Encounter (Signed)
Pt states her prescription for phentermine tore and pharmacy would not accept it to fill. Pt requests prescription phoned in.  [facility] [employer]

## 2014-06-22 NOTE — Telephone Encounter (Signed)
Medication refill request: Phentermine 15 mg  Last AEX: 09/13/13 with SM  Next AEX: 10/24/14 with SM Last MMG (if hormonal medication request): n/a Refill authorized: Please advise.

## 2014-06-22 NOTE — Telephone Encounter (Signed)
Rx signed and printed.  Will sign for it to be faxed.

## 2014-10-11 ENCOUNTER — Telehealth: Payer: Self-pay | Admitting: Obstetrics & Gynecology

## 2014-10-11 NOTE — Telephone Encounter (Addendum)
Left patient a message to call back to reschedule a future appointment that was cancelled by the provider. °

## 2014-10-24 ENCOUNTER — Ambulatory Visit: Payer: BC Managed Care – PPO | Admitting: Obstetrics & Gynecology

## 2014-12-28 ENCOUNTER — Encounter: Payer: Self-pay | Admitting: Obstetrics & Gynecology

## 2014-12-28 ENCOUNTER — Ambulatory Visit (INDEPENDENT_AMBULATORY_CARE_PROVIDER_SITE_OTHER): Payer: BC Managed Care – PPO | Admitting: Obstetrics & Gynecology

## 2014-12-28 VITALS — BP 120/60 | HR 92 | Resp 20 | Ht 63.75 in | Wt 181.0 lb

## 2014-12-28 DIAGNOSIS — Z01419 Encounter for gynecological examination (general) (routine) without abnormal findings: Secondary | ICD-10-CM | POA: Diagnosis not present

## 2014-12-28 DIAGNOSIS — Z124 Encounter for screening for malignant neoplasm of cervix: Secondary | ICD-10-CM | POA: Diagnosis not present

## 2014-12-28 DIAGNOSIS — Z0000 Encounter for general adult medical examination without abnormal findings: Secondary | ICD-10-CM | POA: Diagnosis not present

## 2014-12-28 LAB — CBC
HEMATOCRIT: 35 % — AB (ref 36.0–46.0)
HEMOGLOBIN: 11.5 g/dL — AB (ref 12.0–15.0)
MCH: 29.6 pg (ref 26.0–34.0)
MCHC: 32.9 g/dL (ref 30.0–36.0)
MCV: 90 fL (ref 78.0–100.0)
MPV: 9.8 fL (ref 8.6–12.4)
Platelets: 324 10*3/uL (ref 150–400)
RBC: 3.89 MIL/uL (ref 3.87–5.11)
RDW: 14.3 % (ref 11.5–15.5)
WBC: 9.1 10*3/uL (ref 4.0–10.5)

## 2014-12-28 LAB — POCT URINALYSIS DIPSTICK
Bilirubin, UA: NEGATIVE
GLUCOSE UA: NEGATIVE
Ketones, UA: NEGATIVE
Leukocytes, UA: NEGATIVE
NITRITE UA: NEGATIVE
Protein, UA: NEGATIVE
UROBILINOGEN UA: NEGATIVE
pH, UA: 5

## 2014-12-28 LAB — COMPREHENSIVE METABOLIC PANEL
ALK PHOS: 62 U/L (ref 33–115)
ALT: 13 U/L (ref 6–29)
AST: 12 U/L (ref 10–30)
Albumin: 4.1 g/dL (ref 3.6–5.1)
BUN: 7 mg/dL (ref 7–25)
CHLORIDE: 107 mmol/L (ref 98–110)
CO2: 24 mmol/L (ref 20–31)
Calcium: 8.8 mg/dL (ref 8.6–10.2)
Creat: 0.66 mg/dL (ref 0.50–1.10)
GLUCOSE: 93 mg/dL (ref 65–99)
POTASSIUM: 3.4 mmol/L — AB (ref 3.5–5.3)
Sodium: 139 mmol/L (ref 135–146)
Total Bilirubin: 0.3 mg/dL (ref 0.2–1.2)
Total Protein: 6.8 g/dL (ref 6.1–8.1)

## 2014-12-28 LAB — LIPID PANEL
CHOL/HDL RATIO: 3.1 ratio (ref <=5.0)
Cholesterol: 151 mg/dL (ref 125–200)
HDL: 49 mg/dL (ref >=46)
LDL Cholesterol: 74 mg/dL (ref <130)
Triglycerides: 139 mg/dL (ref <150)
VLDL: 28 mg/dL (ref <30)

## 2014-12-28 LAB — TSH: TSH: 1.659 u[IU]/mL (ref 0.350–4.500)

## 2014-12-28 LAB — VITAMIN D 25 HYDROXY (VIT D DEFICIENCY, FRACTURES): VIT D 25 HYDROXY: 10 ng/mL — AB (ref 30–100)

## 2014-12-28 LAB — HEMOGLOBIN, FINGERSTICK: HEMOGLOBIN, FINGERSTICK: 11.5 g/dL — AB (ref 12.0–16.0)

## 2014-12-28 MED ORDER — PHENTERMINE HCL 15 MG PO CAPS: 15.0000 mg | ORAL_CAPSULE | ORAL | Status: DC

## 2014-12-28 NOTE — Addendum Note (Signed)
Addended by: Jerene BearsMILLER, Dafna Romo S on: 12/28/2014 09:14 AM   Modules accepted: Orders

## 2014-12-28 NOTE — Progress Notes (Addendum)
34 y.o. G1P1 SingleAfrican AmericanF here for annual exam.  Doing well.  Son is a Holiday representativesenior in [employer] and is getting college acceptances right now.  He wants to go to [employer] or [employer].  She and son are going to [employer] Dec 27th and returning Dec 30th.  They went to [employer] in the summer.  His high school just won the state championship in 2A football.    Cycles are still regular.  Flow lasts four or five days.  Minimal cramping.     Patient's last menstrual period was 11/26/2014 (approximate).          Sexually active: No.  The current method of family planning is abstinence.    Exercising: Yes.    Walking 2 - 3 times a week Smoker:  no  Health Maintenance: Pap:  09/13/13 Neg  History of abnormal Pap:  Yes, LEEP 2011 CIN2 MMG:  Never Colonoscopy and BMD:  Not due yet TDaP:  08/2012 Screening Labs: Here today, Hb today: 11.5, Urine today: RBC=Trace   reports that she has never smoked. She has never used smokeless tobacco. She reports that she drinks alcohol. She reports that she does not use illicit drugs.  Past Medical History  Diagnosis Date  . Anemia   . Abnormal Pap smear   . Pilonidal cyst     No current outpatient prescriptions on file.   No current facility-administered medications for this visit.    Family History  Problem Relation Age of Onset  . Lung cancer Paternal Grandmother     smoker  . Hypertension Father   . Hypertension Paternal Grandmother   . Hypertension Paternal Grandfather   . Congestive Heart Failure Father     had been on dialysis x 10 years  . Kidney failure Paternal Grandfather   . Stroke Maternal Grandmother   . Hypothyroidism Paternal Grandmother   . Kidney failure Paternal Grandfather     ROS:  Pertinent items are noted in HPI.  Otherwise, a comprehensive ROS was negative.  Exam:   BP 120/60 mmHg  Pulse 92  Resp 20  Ht 5' 3.75" (1.619 m)  Wt 181 lb (82.101 kg)  BMI 31.32 kg/m2  LMP 11/26/2014 (Approximate)  Weight change: +21#  Height: 5'  3.75" (161.9 cm)  Ht Readings from Last 3 Encounters:  12/28/14 5' 3.75" (1.619 m)  02/06/14 5\' 4"  (1.626 m)  09/13/13 5\' 4"  (1.626 m)    General appearance: alert, cooperative and appears stated age Head: Normocephalic, without obvious abnormality, atraumatic Neck: no adenopathy, supple, symmetrical, trachea midline and thyroid normal to inspection and palpation Lungs: clear to auscultation bilaterally Breasts: normal appearance, no masses or tenderness Heart: regular rate and rhythm Abdomen: soft, non-tender; bowel sounds normal; no masses,  no organomegaly Extremities: extremities normal, atraumatic, no cyanosis or edema Skin: Skin color, texture, turgor normal. No rashes or lesions Lymph nodes: Cervical, supraclavicular, and axillary nodes normal. No abnormal inguinal nodes palpated Neurologic: Grossly normal   Pelvic: External genitalia:  no lesions              Urethra:  normal appearing urethra with no masses, tenderness or lesions              Bartholins and Skenes: normal                 Vagina: normal appearing vagina with normal color and discharge, no lesions              Cervix: no lesions  Pap taken: Yes.   Bimanual Exam:  Uterus:  normal size, contour, position, consistency, mobility, non-tender              Adnexa: normal adnexa and no mass, fullness, tenderness               Rectovaginal: Confirms               Anus:  normal sphincter tone, no lesions  Chaperone was present for exam.  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 HR HPV obtained as well.  CIN II hx.    Restart phentermine.  Pt aware of risks including hypertension, stroke, pulmonary hypertension.  Recheck 6 weeks. return annually or prn

## 2015-01-02 ENCOUNTER — Telehealth: Payer: Self-pay | Admitting: Emergency Medicine

## 2015-01-02 DIAGNOSIS — E559 Vitamin D deficiency, unspecified: Secondary | ICD-10-CM

## 2015-01-02 LAB — IPS PAP TEST WITH HPV

## 2015-01-02 MED ORDER — VITAMIN D (ERGOCALCIFEROL) 1.25 MG (50000 UNIT) PO CAPS: 50000.0000 [IU] | ORAL_CAPSULE | ORAL | Status: DC

## 2015-01-02 NOTE — Telephone Encounter (Signed)
-----   Message from Jerene BearsMary S Miller, MD sent at 12/29/2014  2:28 PM EST ----- Inform pt that hb 11.5.  She's always in the mid 11 range.  Nothing else with CBC was abnormal.  CMP was fine except potassium a tiny bit low.  Repeat not needed.  Lipids normal.  Vit D was 10.  She needs 50K weekly for 12 weeks with repeat Vit D level.  Orders not placed yet.  TSH normal.    Pap smear results pending.

## 2015-01-02 NOTE — Telephone Encounter (Signed)
Call to patient and she is given results from Dr. Hyacinth MeekerMiller. Patient verbalized understanding of instructions for Vitamin D 7829550000 units, 1 tablet weekly for 12 weeks and follow up blood work. [facility]. She will call back to schedule lab appointment.  Advised pap smear results are still pending.  Patient agreeable. Will follow up as directed.  Routing to provider for final review. Patient agreeable to disposition. Will close encounter.

## 2015-01-04 ENCOUNTER — Telehealth: Payer: Self-pay | Admitting: Emergency Medicine

## 2015-01-04 DIAGNOSIS — R87810 Cervical high risk human papillomavirus (HPV) DNA test positive: Principal | ICD-10-CM

## 2015-01-04 DIAGNOSIS — R87610 Atypical squamous cells of undetermined significance on cytologic smear of cervix (ASC-US): Secondary | ICD-10-CM

## 2015-01-04 NOTE — Telephone Encounter (Signed)
-----   Message from Jerene BearsMary S Miller, MD sent at 01/03/2015  8:03 AM EST ----- Inform pt pap was ascus with + HR HPV.  She needs a repeat colposcopy.  She also may need valium before the procedure as doing a pap is fairly difficult.  I can write Rx if she wants this.

## 2015-01-04 NOTE — Telephone Encounter (Signed)
Call to patient.  Voicemail box is full.  Unable to leave message.    

## 2015-01-04 NOTE — Telephone Encounter (Signed)
Patient returned call and she is given message from Dr. Hyacinth MeekerMiller. Colposcopy is scheduled for 01/09/15 at 1000. LMP 12/28/14. Patient is not sexually active.   Brief description of procedure given to patient.  Colposcopy pre-procedure instructions given.  Discussed menses and need to not have any bleeding on day of appointment, advised to call to reschedule if starts cycle.  Make sure to eat a meal and hydrate before appointment.  Advised 800 mg of Motrin PO with food one hour prior to appointment.    Patient verbalized understanding of preprocedure instructions and  will call to reschedule if will be on menses or has any concerns regarding pregnancy.  Patient is advised she will be contacted with insurance coverage information. cc Harland DingwallSuzy Dixon Patient does feel she would benefit from pre-medication with Valium. She is advised to arrive one hour early to sign consent then take Valium. She verbalizes understanding that she must have a driver to take her home after procedure if taking Valium.   Dr. Hyacinth MeekerMiller,  Molli Knockkay as scheduled as patient is not sexually active? Also, can you order Valium Rx?

## 2015-01-05 MED ORDER — DIAZEPAM 5 MG PO TABS: ORAL_TABLET | ORAL | Status: DC

## 2015-01-05 NOTE — Telephone Encounter (Signed)
Spoke with pt regarding benefit for colposcopy. Patient understood and agreeable. Patient scheduled 01/09/15 with Dr Hyacinth MeekerMiller. Pt aware of arrival date and time. Pt aware of 72 hours cancellation policy with $100 fee. No further questions. Ok to close

## 2015-01-09 ENCOUNTER — Encounter: Payer: Self-pay | Admitting: Obstetrics & Gynecology

## 2015-01-09 ENCOUNTER — Ambulatory Visit (INDEPENDENT_AMBULATORY_CARE_PROVIDER_SITE_OTHER): Payer: BC Managed Care – PPO | Admitting: Obstetrics & Gynecology

## 2015-01-09 DIAGNOSIS — R87610 Atypical squamous cells of undetermined significance on cytologic smear of cervix (ASC-US): Secondary | ICD-10-CM | POA: Diagnosis not present

## 2015-01-09 DIAGNOSIS — R87810 Cervical high risk human papillomavirus (HPV) DNA test positive: Secondary | ICD-10-CM | POA: Diagnosis not present

## 2015-01-09 NOTE — Addendum Note (Signed)
Addended by: Jerene BearsMILLER, Rodolphe Edmonston S on: 01/09/2015 05:28 PM   Modules accepted: Orders

## 2015-01-09 NOTE — Progress Notes (Signed)
Subjective:     Patient ID: Katie Curtis, female   DOB: [birth year], 35 y.o.   MRN: [id number]  HPI 35 yo G1P1 Single AA female here for colposcopy due to ASCUS pap with + HR HPV.  Pt does have hx of LEEP in 2011 with CIN 2 and negative margins.  She's had normal pap smears since the LEEP procedure.  Pt does not do well with colposcopy examinations so she came early today and signed consent before taking any valium.  She took 5mg  valium about 1 hour ago.  LMP 12/28/14.  Not SA.  Patient has been counseled about results and procedure.  Risks and benefits have bene reviewed including immediate and/or delayed bleeding, infection, cervical scaring from procedure, possibility of needing additional follow up as well as treatment.  rare risks of missing a lesion discussed as well.  All questions answered.  Pt ready to proceed.  Review of Systems  All other systems reviewed and are negative.      Objective:   Physical Exam  Constitutional: She is oriented to person, place, and time. She appears well-developed and well-nourished.  Genitourinary: Vagina normal. There is no rash, tenderness, lesion or injury on the right labia. There is no rash, tenderness, lesion or injury on the left labia.    Lymphadenopathy:       Right: No inguinal adenopathy present.       Left: No inguinal adenopathy present.  Neurological: She is alert and oriented to person, place, and time.  Psychiatric: She has a normal mood and affect.   Speculum placed.  3% acetic acid applied to cervix for >45 seconds.  Cervix visualized with both 7.5X and 15X magnification.  Green filter also used.  Lugols solution was used.  Findings:  No Awe noted and no abnormal staining with Lugol's solution.  TZ visualized completely.  Biopsy:  Not obtained.  ECC:  was performed.  Monsel's was not needed.  Excellent hemostasis was present.  Pt tolerated procedure well and all instruments were removed.  Findings noted above on picture of  cervix.     Assessment:     ASCUS pap with +HR HPV H/o LEEP with CIN 2 2011     Plan:     ECC pending.  If negative, repeat pap and HR HPV 1 year.

## 2015-01-09 NOTE — Patient Instructions (Signed)

## 2015-01-12 LAB — IPS OTHER TISSUE BIOPSY

## 2015-01-18 ENCOUNTER — Telehealth: Payer: Self-pay | Admitting: Emergency Medicine

## 2015-01-18 NOTE — Telephone Encounter (Signed)
-----   Message from Jerene BearsMary S Miller, MD sent at 01/15/2015 12:11 PM EST ----- Please let pt know that her ECC was negative for abnormal cells.  She needs repeat Pap smear and HR HPV testing 1 year.  08 recall.

## 2015-01-18 NOTE — Telephone Encounter (Signed)
Patient notified of results. Verbalized understanding and importance of follow up in one year.  08 recall entered. Patient has annual exam scheduled for 04/2016.  Will follow up as scheduled for office visit for phentermine recheck.  Routing to provider for final review. Patient agreeable to disposition. Will close encounter.

## 2015-02-06 ENCOUNTER — Other Ambulatory Visit: Payer: Self-pay | Admitting: Obstetrics & Gynecology

## 2015-02-06 MED ORDER — PHENTERMINE HCL 15 MG PO CAPS: 15.0000 mg | ORAL_CAPSULE | ORAL | Status: DC

## 2015-02-06 NOTE — Telephone Encounter (Signed)
Medication refill request: phentermine Last AEX:  12-28-14 Next AEX: 04-22-16 Last MMG (if hormonal medication request): N/A Refill authorized: please advise    Patient lost her prescription for phentermine 15 mg and she is needing this sent to her [employer].  Best # to reach: [phone] (okay to leave message)

## 2015-02-06 NOTE — Telephone Encounter (Signed)
Patient lost her prescription for phentermine 15 mg and she is needing this sent to her [employer]. Best # to reach: [phone] (okay to leave message)  [facility] [facility] [place], [place] - [address] [place] [address] Phone: [phone] Fax: [phone]

## 2015-02-06 NOTE — Telephone Encounter (Signed)
Rx faxed today 

## 2015-02-15 ENCOUNTER — Encounter: Payer: Self-pay | Admitting: Obstetrics & Gynecology

## 2015-02-15 ENCOUNTER — Telehealth: Payer: Self-pay | Admitting: Obstetrics & Gynecology

## 2015-02-15 NOTE — Telephone Encounter (Signed)
Patient cancelled appointment for 02/16/15 with Dr. Hyacinth Meeker, r/s patient to 03/16/15 (patient's request).

## 2015-02-16 ENCOUNTER — Ambulatory Visit: Payer: BC Managed Care – PPO | Admitting: Obstetrics & Gynecology

## 2015-03-16 ENCOUNTER — Telehealth: Payer: Self-pay | Admitting: Obstetrics & Gynecology

## 2015-03-16 ENCOUNTER — Ambulatory Visit: Payer: BC Managed Care – PPO | Admitting: Obstetrics & Gynecology

## 2015-03-16 NOTE — Telephone Encounter (Signed)
Patient rescheduled recheck appointment today because she thought appointment was earlier in the day than what was scheduled. Rescheduled to 03/30/15 at 8:30. No dnka fee charged.

## 2015-03-30 ENCOUNTER — Encounter: Payer: Self-pay | Admitting: Obstetrics & Gynecology

## 2015-03-30 ENCOUNTER — Ambulatory Visit (INDEPENDENT_AMBULATORY_CARE_PROVIDER_SITE_OTHER): Payer: BC Managed Care – PPO | Admitting: Obstetrics & Gynecology

## 2015-03-30 VITALS — BP 112/70 | HR 96 | Resp 16 | Ht 63.75 in | Wt 180.0 lb

## 2015-03-30 DIAGNOSIS — E669 Obesity, unspecified: Secondary | ICD-10-CM

## 2015-03-30 NOTE — Progress Notes (Signed)
GYNECOLOGY  VISIT   HPI: 35 y.o. G1P1 Single African American female here to discuss weight and desires to lose additional weight.  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  She's got a full year left to complete the program.  She is doing virtual class work which involves on-line participation twice weekly from 5-8.  This is in additional to actual work of the program.  Therefore, the ability to focus on exercise is hard right now.  Also, she admits that making good food choices is not always easy as well.  In addition, her son is graduating from high school and she works a second job.  D/w pt that phentermine is not without side effects and if she is going to work towards weight loss, then I think it ok to use it, but if she thinks the medication itself will cause her to lose weight, then she will not be successful.  Feel it is best for her to work on her graduate degree and focus on her son right now, and when she is ready, we can discuss this again.  Pt fully agrees with the business of her life and wants to consider this in the summer when she is off for two months from school.    Current weight: 180.  She's lost one pound since last visit BMI: 31.1   Patient Active Problem List   Diagnosis Date Noted  . ASCUS with positive high risk HPV cervical 01/09/2015    Past Medical History  Diagnosis Date  . Anemia   . Abnormal Pap smear   . Pilonidal cyst     Past Surgical History  Procedure Laterality Date  . Leep      CIN II    MEDS:  Reviewed in EPIC and UTD  ALLERGIES: Hydrocodone; Oxycodone; and Penicillins  Family History  Problem Relation Age of Onset  . Lung cancer Paternal Grandmother     smoker  . Hypertension Father   . Hypertension Paternal Grandmother   . Hypertension Paternal Grandfather   . Congestive Heart Failure Father     had been on dialysis x 10 years  . Kidney  failure Paternal Grandfather   . Stroke Maternal Grandmother   . Hypothyroidism Paternal Grandmother   . Kidney failure Paternal Grandfather     SH:  Single, non-smoker  Review of Systems  All other systems reviewed and are negative.   PHYSICAL EXAMINATION:    BP 112/70 mmHg  Pulse 96  Resp 16  Ht 5' 3.75" (1.619 m)  Wt 180 lb (81.647 kg)  BMI 31.15 kg/m2    General appearance: alert, cooperative and appears stated age Mood: normal with normal thought pattern  Assessment: Obesity with desire to lose weight but many social stressors  Plan: Consider restarting this in the summer or at a later date when she does not have so much stress going on in her life and she can regularly make time for exercise and has the energy to make good food choices.  Pt is in agreement with plan.   ~15 minutes spent with patient >50% of time was in face to face discussion of above.

## 2015-03-31 DIAGNOSIS — E669 Obesity, unspecified: Secondary | ICD-10-CM | POA: Insufficient documentation

## 2015-05-15 ENCOUNTER — Other Ambulatory Visit: Payer: Self-pay | Admitting: Obstetrics & Gynecology

## 2015-05-15 NOTE — Telephone Encounter (Signed)
Medication refill request: Phentermine Last AEX:  12-28-14  Next AEX: 04-22-16  Last MMG (if hormonal medication request): N/A Refill authorized: please advise

## 2015-05-24 ENCOUNTER — Telehealth: Payer: Self-pay | Admitting: Obstetrics & Gynecology

## 2015-05-24 NOTE — Telephone Encounter (Signed)
Patient is asking for a refill of Phentermine. Confirmed pharmacy with patient. Patient is asking if she needs to come in for a recheck?

## 2015-05-24 NOTE — Telephone Encounter (Signed)
Return call to patient. She was seen 03-30-15 and discussed Phentermine with Dr Hyacinth MeekerMiller at that visit. Calling now to request medication be called in. She is out of class and feels ready to focus on weight loss.  States she has not lost any weight since here in March. Doesn't know if she has gained any either.  [facility] [place].  Advised usually need office visit to begin this medication. Since has discussed with Dr Hyacinth MeekerMiller, will review this request with her and call her back.

## 2015-05-25 MED ORDER — PHENTERMINE HCL 15 MG PO CAPS: 15.0000 mg | ORAL_CAPSULE | ORAL | Status: DC

## 2015-05-25 NOTE — Telephone Encounter (Signed)
[facility] [address] [place], [place]. Fax number [phone].

## 2015-05-25 NOTE — Telephone Encounter (Signed)
Rx signed.  Will fax to her pharmacy.  Needs follow up 4-6 weeks.

## 2015-06-05 ENCOUNTER — Telehealth: Payer: Self-pay | Admitting: Obstetrics & Gynecology

## 2015-06-05 NOTE — Telephone Encounter (Signed)
Called but voicemail was full. Please ask patient to speak with Mervin KungStarla re:  Pt had abnormal pap smear in 12/16. Needs AEX in one year. Currently scheduled for 4/18. Can you move up her appt to closer to one year. 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 We are going to discuss the holidays at the next provider meeting, if we have time. Thanks.

## 2015-06-11 NOTE — Telephone Encounter (Signed)
Patient called and rescheduled to 01/10/16 with Dr. Hyacinth MeekerMiller. Routing to Dr. Hyacinth MeekerMiller for final review.

## 2015-06-11 NOTE — Telephone Encounter (Signed)
Left message to call back and ask for Starla.

## 2015-08-08 NOTE — Telephone Encounter (Signed)
Okay to close encounter?  °

## 2015-08-09 NOTE — Telephone Encounter (Signed)
Encounter closed

## 2016-01-10 ENCOUNTER — Ambulatory Visit (INDEPENDENT_AMBULATORY_CARE_PROVIDER_SITE_OTHER): Payer: BC Managed Care – PPO | Admitting: Obstetrics & Gynecology

## 2016-01-10 ENCOUNTER — Encounter: Payer: Self-pay | Admitting: Obstetrics & Gynecology

## 2016-01-10 VITALS — BP 122/82 | HR 88 | Resp 14 | Ht 65.0 in | Wt 194.0 lb

## 2016-01-10 DIAGNOSIS — Z124 Encounter for screening for malignant neoplasm of cervix: Secondary | ICD-10-CM | POA: Diagnosis not present

## 2016-01-10 DIAGNOSIS — E559 Vitamin D deficiency, unspecified: Secondary | ICD-10-CM | POA: Diagnosis not present

## 2016-01-10 DIAGNOSIS — Z0000 Encounter for general adult medical examination without abnormal findings: Secondary | ICD-10-CM

## 2016-01-10 LAB — CBC
HEMATOCRIT: 39.2 % (ref 35.0–45.0)
Hemoglobin: 12.3 g/dL (ref 11.7–15.5)
MCH: 28.5 pg (ref 27.0–33.0)
MCHC: 31.4 g/dL — AB (ref 32.0–36.0)
MCV: 90.7 fL (ref 80.0–100.0)
MPV: 9.4 fL (ref 7.5–12.5)
PLATELETS: 330 10*3/uL (ref 140–400)
RBC: 4.32 MIL/uL (ref 3.80–5.10)
RDW: 14.3 % (ref 11.0–15.0)
WBC: 10.5 10*3/uL (ref 3.8–10.8)

## 2016-01-10 LAB — LIPID PANEL
CHOL/HDL RATIO: 2.9 ratio (ref <5.0)
Cholesterol: 186 mg/dL (ref <200)
HDL: 64 mg/dL (ref >50)
LDL Cholesterol: 110 mg/dL — ABNORMAL HIGH (ref <100)
Triglycerides: 59 mg/dL (ref <150)
VLDL: 12 mg/dL (ref <30)

## 2016-01-10 LAB — COMPREHENSIVE METABOLIC PANEL
ALK PHOS: 65 U/L (ref 33–115)
ALT: 12 U/L (ref 6–29)
AST: 12 U/L (ref 10–30)
Albumin: 4.2 g/dL (ref 3.6–5.1)
BUN: 8 mg/dL (ref 7–25)
CALCIUM: 9.3 mg/dL (ref 8.6–10.2)
CHLORIDE: 104 mmol/L (ref 98–110)
CO2: 25 mmol/L (ref 20–31)
Creat: 0.71 mg/dL (ref 0.50–1.10)
GLUCOSE: 96 mg/dL (ref 65–99)
POTASSIUM: 3.7 mmol/L (ref 3.5–5.3)
Sodium: 140 mmol/L (ref 135–146)
Total Bilirubin: 0.3 mg/dL (ref 0.2–1.2)
Total Protein: 7 g/dL (ref 6.1–8.1)

## 2016-01-10 LAB — POCT URINALYSIS DIPSTICK
Bilirubin, UA: NEGATIVE
Glucose, UA: NEGATIVE
KETONES UA: NEGATIVE
Leukocytes, UA: NEGATIVE
Nitrite, UA: NEGATIVE
PH UA: 5
PROTEIN UA: NEGATIVE
RBC UA: NEGATIVE
UROBILINOGEN UA: NEGATIVE

## 2016-01-10 MED ORDER — FLUOXETINE HCL 10 MG PO TABS: ORAL_TABLET | ORAL | 1 refills | Status: DC

## 2016-01-10 NOTE — Progress Notes (Signed)
36 y.o. G1P1 Single African American F here for annual exam.  Doing well.  Son is a Printmaker at [employer].  Cycles are regular but are about 5 weeks in length.  This is normal for her.  She is working on her graduate degree.  Mother has knee surgery and pt had to help with care.  She ended up dropping her two classes for last semester.  Having depressed mood and change in sleep.  No suicidal and homicidal ideation.  Would consider treatment.  Does not feel she needs a therapist.      Sexually active: Yes.    The current method of family planning is condoms. Exercising: No.  The patient does not participate in regular exercise at present. Smoker:  no  Health Maintenance: Pap:  12/28/14 ASCUS, + HR HPV- colpo 01/09/15 ecc negative History of abnormal Pap:  yes MMG:  never Colonoscopy:  never BMD:   never TDaP:  09/02/12  Pneumonia vaccine(s):  Not indicated Zostavax:   Not indcated Hep C testing: not indicated Screening Labs: drawn today, Hb today: drawn today, Urine today: normal     reports that she has never smoked. She has never used smokeless tobacco. She reports that she drinks alcohol. She reports that she does not use drugs.  Past Medical History:  Diagnosis Date  . Abnormal Pap smear   . Anemia   . Pilonidal cyst     Past Surgical History:  Procedure Laterality Date  . LEEP     CIN II    Current Outpatient Prescriptions  Medication Sig Dispense Refill  . phentermine 15 MG capsule Take 1 capsule (15 mg total) by mouth every morning. 30 capsule 1  . Vitamin D, Ergocalciferol, (DRISDOL) 50000 UNITS CAPS capsule Take 1 capsule (50,000 Units total) by mouth every 7 (seven) days. 12 capsule 0   No current facility-administered medications for this visit.     Family History  Problem Relation Age of Onset  . Lung cancer Paternal Grandmother     smoker  . Hypertension Father   . Hypertension Paternal Grandmother   . Hypertension Paternal Grandfather   . Congestive Heart  Failure Father     had been on dialysis x 10 years  . Kidney failure Paternal Grandfather   . Stroke Maternal Grandmother   . Hypothyroidism Paternal Grandmother   . Kidney failure Paternal Grandfather     ROS:  Pertinent items are noted in HPI.  Otherwise, a comprehensive ROS was negative.  Exam:   BP 122/82 (BP Location: Right Arm, Patient Position: Sitting, Cuff Size: Normal)   Pulse 88   Resp 14   Ht 5\' 5"  (1.651 m)   Wt 194 lb (88 kg)   LMP  (Within Months) Comment: 11/17   BMI 32.28 kg/m   Weight change: +13# Height: 5\' 5"  (165.1 cm)  Ht Readings from Last 3 Encounters:  01/10/16 5\' 5"  (1.651 m)  03/30/15 5' 3.75" (1.619 m)  01/09/15 5' 3.75" (1.619 m)    General appearance: alert, cooperative and appears stated age Head: Normocephalic, without obvious abnormality, atraumatic Neck: no adenopathy, supple, symmetrical, trachea midline and thyroid normal to inspection and palpation Lungs: clear to auscultation bilaterally Breasts: normal appearance, no masses or tenderness Heart: regular rate and rhythm Abdomen: soft, non-tender; bowel sounds normal; no masses,  no organomegaly Extremities: extremities normal, atraumatic, no cyanosis or edema Skin: Skin color, texture, turgor normal. No rashes or lesions Lymph nodes: Cervical, supraclavicular, and axillary nodes normal. No abnormal  inguinal nodes palpated Neurologic: Grossly normal  Pelvic: External genitalia:  no lesions              Urethra:  normal appearing urethra with no masses, tenderness or lesions              Bartholins and Skenes: normal                 Vagina: normal appearing vagina with normal color and discharge, no lesions              Cervix: no lesions              Pap taken: Yes.   Bimanual Exam:  Uterus:  normal size, contour, position, consistency, mobility, non-tender              Adnexa: normal adnexa and no mass, fullness, tenderness               Rectovaginal: Confirms               Anus:   normal sphincter tone, no lesions  Chaperone was present for exam.  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  H/O ASCUS pap with +HR HPV and colposcopy 1/17. H/o mild anemia Mild depression Weight gain  P:  Mammogram starting age 36  pap smear today. HR HPV obtained as well.  CIN II hx.    Will not restart phentermine right now. CBC, CMP, Vit D and lipid panel today Start prozac 10mg  daily.  Pt will start with 1/2 tab daily.  Rx to pharmacy.  Recheck 6-8 weeks.  rx to pharmacy. Return annually or prn

## 2016-01-11 ENCOUNTER — Telehealth: Payer: Self-pay | Admitting: *Deleted

## 2016-01-11 LAB — VITAMIN D 25 HYDROXY (VIT D DEFICIENCY, FRACTURES): VIT D 25 HYDROXY: 16 ng/mL — AB (ref 30–100)

## 2016-01-11 MED ORDER — VITAMIN D (ERGOCALCIFEROL) 1.25 MG (50000 UNIT) PO CAPS: 50000.0000 [IU] | ORAL_CAPSULE | ORAL | 0 refills | Status: DC

## 2016-01-11 NOTE — Telephone Encounter (Signed)
-----   Message from Jerene BearsMary S Miller, MD sent at 01/11/2016 10:23 AM EST ----- Please inform pt that her CBC was normal, metabolic panel was normal, lipid panel had a mildly elevated LDL but total cholesterol was normal and triglycerides were normal.  Nothing is needed for treatment here.  Lastly, her Vit D was low again.  She needs 50K weekly and repeat the Vit D level in 12 weeks.  Order for lab has been placed but not Rx.  Thanks.

## 2016-01-11 NOTE — Addendum Note (Signed)
Addended by: Jerene BearsMILLER, Valeda Corzine S on: 01/11/2016 10:24 AM   Modules accepted: Orders

## 2016-01-11 NOTE — Telephone Encounter (Signed)
Call to patient. Results reviewed with patient and she verbalized understanding. Patient has an appointment for 6 week recheck on 02/22/16- patient requests to schedule lab appointment then. Pharmacy on file verified and prescription for vitamin d sent in. Instructions on how to take reviewed with patient and she verbalized understanding.   Routing to provider for final review. Patient agreeable to disposition. Will close encounter.

## 2016-01-14 LAB — IPS PAP TEST WITH HPV

## 2016-02-21 ENCOUNTER — Telehealth: Payer: Self-pay | Admitting: Obstetrics & Gynecology

## 2016-02-21 NOTE — Telephone Encounter (Signed)
Patient canceled her 6-8 week recheck 02/25/16. Patient said she would call later to reschedule.

## 2016-02-22 ENCOUNTER — Ambulatory Visit: Payer: Self-pay | Admitting: Obstetrics & Gynecology

## 2016-04-22 ENCOUNTER — Ambulatory Visit: Payer: BC Managed Care – PPO | Admitting: Obstetrics & Gynecology

## 2016-08-04 ENCOUNTER — Telehealth: Payer: Self-pay | Admitting: Obstetrics & Gynecology

## 2016-08-04 NOTE — Telephone Encounter (Signed)
Patient called to request prescription for phentermine. Patient states if she needs an appointment, schedule early morning appointment and leave on voicemail at [phone].    Please advise.

## 2016-08-04 NOTE — Telephone Encounter (Signed)
Spoke with patient. Patient is also due to Prozac recheck. Appointment scheduled for 08/07/16 at 10 am with Dr.Miller. Patient is agreeable to date and time.  Routing to provider for final review. Patient agreeable to disposition. Will close encounter.

## 2016-08-07 ENCOUNTER — Ambulatory Visit (INDEPENDENT_AMBULATORY_CARE_PROVIDER_SITE_OTHER): Payer: BC Managed Care – PPO | Admitting: Obstetrics & Gynecology

## 2016-08-07 ENCOUNTER — Encounter: Payer: Self-pay | Admitting: Obstetrics & Gynecology

## 2016-08-07 VITALS — BP 110/74 | HR 76 | Resp 12 | Ht 63.5 in | Wt 192.5 lb

## 2016-08-07 DIAGNOSIS — Z6833 Body mass index (BMI) 33.0-33.9, adult: Secondary | ICD-10-CM

## 2016-08-07 DIAGNOSIS — E669 Obesity, unspecified: Secondary | ICD-10-CM

## 2016-08-07 MED ORDER — PHENTERMINE HCL 15 MG PO CAPS: 15.0000 mg | ORAL_CAPSULE | ORAL | 1 refills | Status: DC

## 2016-08-07 NOTE — Progress Notes (Signed)
GYNECOLOGY  VISIT   HPI: 36 y.o. G1P1 Single African American female here to discuss restarting phentermine.  Really ready to work on weight.  Has been exercising this summer.  Feels good but just can't get weight back on track.  Is only taking one course this fall towards her Masters Degree so her work load will be lighter this fall as well..  Son is doing really well.  Took a summer school class this summer.  Worked at [employer] this summer.  He was on Dean's list.  He is moving into an off-campus apartment this fall.  He is going to live with his best friend and two other guys.  They each have their own bathroom and bedroom.   GYNECOLOGIC HISTORY: Patient's last menstrual period was 07/28/2016. Contraception: abstinance Menopausal hormone therapy: n/a  Patient Active Problem List   Diagnosis Date Noted  . Obese 03/31/2015  . 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 Abnormal Pap smear   . Anemia   . Pilonidal cyst     Past Surgical History:  Procedure Laterality Date  . LEEP     CIN II    MEDS:   No current outpatient prescriptions on file prior to visit.   No current facility-administered medications on file prior to visit.      ALLERGIES: Hydrocodone; Oxycodone; and Penicillins  Family History  Problem Relation Age of Onset  . Lung cancer Paternal Grandmother        smoker  . Hypertension Paternal Grandmother   . Hypothyroidism Paternal Grandmother   . Hypertension Father   . Congestive Heart Failure Father        had been on dialysis x 10 years  . Hypertension Paternal Grandfather   . Kidney failure Paternal Grandfather   . Stroke Maternal Grandmother     SH:  Divorced, non smoker  Review of Systems  All other systems reviewed and are negative.   PHYSICAL EXAMINATION:    BP 110/74 (BP Location: Right Arm, Patient Position: Sitting, Cuff Size: Large)   Pulse 76   Resp 12   Ht 5' 3.5" (1.613 m)    Wt 192 lb 8 oz (87.3 kg)   LMP 07/28/2016   BMI 33.56 kg/m     General appearance: alert, cooperative and appears stated age Neck: no adenopathy, supple, symmetrical, trachea midline and thyroid normal to inspection and palpation CV:  Regular rate and rhythm Lungs:  clear to auscultation, no wheezes, rales or rhonchi, symmetric air entry  Chaperone was present for exam.  Assessment: Obesity with BMI 33.5, desirous of weight loss  Plan: Restart phentermine 15mg  daily.  Rx to pt.  #30/1RF.  Return 8 weeks.  Risks, side effects reviewed.  As well, reviewed expected weight lost over next several months.  Pt aware to cal with any questions, concerns, side effects.   ~15 minutes spent with patient >50% of time was in face to face discussion of above.

## 2016-08-17 ENCOUNTER — Encounter (HOSPITAL_COMMUNITY): Payer: Self-pay | Admitting: *Deleted

## 2016-08-17 ENCOUNTER — Emergency Department (HOSPITAL_COMMUNITY)
Admission: EM | Admit: 2016-08-17 | Discharge: 2016-08-17 | Disposition: A | Discharge status: Home / Self Care | Payer: BC Managed Care – PPO | Attending: Emergency Medicine | Admitting: Emergency Medicine

## 2016-08-17 DIAGNOSIS — K0889 Other specified disorders of teeth and supporting structures: Secondary | ICD-10-CM | POA: Insufficient documentation

## 2016-08-17 MED ORDER — ACETAMINOPHEN 500 MG PO TABS
1000.0000 mg | ORAL_TABLET | Freq: Once | ORAL | Status: AC
  Administered 2016-08-17: 1000 mg via ORAL
  Filled 2016-08-17: qty 2

## 2016-08-17 MED ORDER — TRAMADOL HCL 50 MG PO TABS
50.0000 mg | ORAL_TABLET | Freq: Once | ORAL | Status: AC
  Administered 2016-08-17: 50 mg via ORAL
  Filled 2016-08-17: qty 1

## 2016-08-17 NOTE — ED Triage Notes (Signed)
Pt c/o right upper toothache that started yesterday, has dentist appointment tomorrow,

## 2016-08-17 NOTE — ED Provider Notes (Signed)
[facility]-EM[facility]ENCY DEPT Provider Note   CSN: [id number] Arrival date & time: 08/17/16  1924     History   Chief Complaint Chief Complaint  Patient presents with  . Dental Pain    HPI Katie Curtis is a 36 y.o. female presenting with right upper toothache which started yesterday.  She denies any injuries to the tooth, denies hot and cold sensitivity.  She does have a chronic cavity in this tooth and is actually seen her dentist tomorrow for evaluation for dental implant at this site.  She denies fevers or chills, denies gingival swelling, no drainage from around the tooth.  She has taken ibuprofen 800 mg which has helped her symptoms in the past but has been not working since yesterday.  Her last dose of this medication was at 6 PM tonight.  The history is provided by the patient.    Past Medical History:  Diagnosis Date  . Abnormal Pap smear   . Anemia   . Pilonidal cyst     Patient Active Problem List   Diagnosis Date Noted  . Obese 03/31/2015  . 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 LEEP     CIN II    OB History    Gravida Para Term Preterm AB Living   1 1       1    SAB TAB Ectopic Multiple Live Births                   Home Medications    Prior to Admission medications   Medication Sig Start Date End Date Taking? Authorizing Provider  phentermine 15 MG capsule Take 1 capsule (15 mg total) by mouth every morning. 08/07/16   Jerene BearsMiller, Mary S, MD    Family History Family History  Problem Relation Age of Onset  . Lung cancer Paternal Grandmother        smoker  . Hypertension Paternal Grandmother   . Hypothyroidism Paternal Grandmother   . Hypertension Father   . Congestive Heart Failure Father        had been on dialysis x 10 years  . Hypertension Paternal Grandfather   . Kidney failure Paternal Grandfather   . Stroke Maternal Grandmother     Social History Social History    Substance Use Topics  . Smoking status: Never Smoker  . Smokeless tobacco: Never Used  . Alcohol use Yes     Comment: socially     Allergies   Hydrocodone; Oxycodone; and Penicillins   Review of Systems Review of Systems  Constitutional: Negative for fever.  HENT: Positive for dental problem. Negative for facial swelling and sore throat.   Respiratory: Negative for shortness of breath.   Musculoskeletal: Negative for neck pain and neck stiffness.     Physical Exam Updated Vital Signs BP (!) 155/103 (BP Location: Right Arm)   Pulse 99   Temp 99.4 F (37.4 C) (Oral)   Resp 18   Ht 5\' 3"  (1.6 m)   Wt 87.1 kg (192 lb)   LMP 07/28/2016   SpO2 100%   BMI 34.01 kg/m   Physical Exam  Constitutional: She is oriented to person, place, and time. She appears well-developed and well-nourished. No distress.  HENT:  Head: Normocephalic and atraumatic.  Right Ear: Tympanic membrane and external ear normal.  Left Ear: Tympanic membrane and external ear normal.  Mouth/Throat: Oropharynx is clear and moist and mucous membranes  are normal. No oral lesions. No trismus in the jaw. Dental caries present. No dental abscesses.    Eyes: Conjunctivae are normal.  Neck: Normal range of motion. Neck supple.  Cardiovascular: Normal rate and normal heart sounds.   Pulmonary/Chest: Effort normal.  Abdominal: She exhibits no distension.  Musculoskeletal: Normal range of motion.  Lymphadenopathy:  No head or neck adenopathy.  Neurological: She is alert and oriented to person, place, and time.  Skin: Skin is warm and dry. No erythema.  Psychiatric: She has a normal mood and affect.     ED Treatments / Results  Labs (all labs ordered are listed, but only abnormal results are displayed) Labs Reviewed - No data to display  EKG  EKG Interpretation None       Radiology No results found.  Procedures Procedures (including critical care time)  Medications Ordered in ED Medications   acetaminophen (TYLENOL) tablet 1,000 mg (1,000 mg Oral Given 08/17/16 2018)  traMADol (ULTRAM) tablet 50 mg (50 mg Oral Given 08/17/16 2018)     Initial Impression / Assessment and Plan / ED Course  I have reviewed the triage vital signs and the nursing notes.  Pertinent labs & imaging results that were available during my care of the patient were reviewed by me and considered in my medical decision making (see chart for details).     Patient with acute on chronic dental pain who is scheduled to see her dentist in the morning.  She was given a dose of Tylenol here along with a tramadol tablet.  She is encouraged to keep her appointment with her dentist in the morning as planned.  Final Clinical Impressions(s) / ED Diagnoses   Final diagnoses:  Pain, dental    New Prescriptions Discharge Medication List as of 08/17/2016  7:44 PM       Burgess Amor, PA-C 08/17/16 2111    Bethann Berkshire, MD 08/18/16 1336

## 2016-10-07 ENCOUNTER — Ambulatory Visit: Payer: BC Managed Care – PPO | Admitting: Obstetrics & Gynecology

## 2016-10-14 ENCOUNTER — Ambulatory Visit (INDEPENDENT_AMBULATORY_CARE_PROVIDER_SITE_OTHER): Payer: BC Managed Care – PPO | Admitting: Obstetrics & Gynecology

## 2016-10-14 ENCOUNTER — Encounter: Payer: Self-pay | Admitting: Obstetrics & Gynecology

## 2016-10-14 VITALS — BP 124/84 | HR 76 | Resp 14 | Ht 63.5 in | Wt 177.0 lb

## 2016-10-14 DIAGNOSIS — E669 Obesity, unspecified: Secondary | ICD-10-CM | POA: Diagnosis not present

## 2016-10-14 DIAGNOSIS — Z6830 Body mass index (BMI) 30.0-30.9, adult: Secondary | ICD-10-CM | POA: Diagnosis not present

## 2016-10-14 MED ORDER — PHENTERMINE HCL 15 MG PO CAPS: 15.0000 mg | ORAL_CAPSULE | ORAL | 1 refills | Status: DC

## 2016-10-14 NOTE — Progress Notes (Signed)
GYNECOLOGY  VISIT  CC:   Recheck weight  HPI: 36 y.o. G1P1 Single African American female here for weight check.  She has been on phentermine since August.  She is exercising more regularly and watching diet much more carefully.  States she is more rigid during the week and is more lenient on herself on the weekend.    Down 15# since last visit.    Denies headache or visual changes.  GYNECOLOGIC HISTORY: Patient's last menstrual period was 09/10/2016. Contraception: abstinance  Patient Active Problem List   Diagnosis Date Noted  . Obese 03/31/2015  . 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 Abnormal Pap smear   . Anemia   . Pilonidal cyst     Past Surgical History:  Procedure Laterality Date  . LEEP     CIN II    MEDS:   Current Outpatient Prescriptions on File Prior to Visit  Medication Sig Dispense Refill  . phentermine 15 MG capsule Take 1 capsule (15 mg total) by mouth every morning. 30 capsule 1   No current facility-administered medications on file prior to visit.     ALLERGIES: Hydrocodone; Oxycodone; and Penicillins  Family History  Problem Relation Age of Onset  . Lung cancer Paternal Grandmother        smoker  . Hypertension Paternal Grandmother   . Hypothyroidism Paternal Grandmother   . Hypertension Father   . Congestive Heart Failure Father        had been on dialysis x 10 years  . Hypertension Paternal Grandfather   . Kidney failure Paternal Grandfather   . Stroke Maternal Grandmother     SH:  Divorced, non smoker  Review of Systems  All other systems reviewed and are negative.   PHYSICAL EXAMINATION:    BP 124/84 (BP Location: Right Arm, Patient Position: Sitting, Cuff Size: Normal)   Pulse 76   Resp 14   Ht 5' 3.5" (1.613 m)   Wt 177 lb (80.3 kg)   LMP 09/10/2016   BMI 30.86 kg/m     General appearance: alert, cooperative and appears stated age CV:  Regular rate and  rhythm Lungs:  clear to auscultation, no wheezes, rales or rhonchi, symmetric air entry  Assessment: Desired weight loss.  BMI currently 30.9.  Plan: RF for phentermine  daily.  #30/1RF.  Recheck 8 weeks.  Side effects/risks continued.  Pt desires to continue.   ~15 minutes spent with patient >50% of time was in face to face discussion of above.

## 2016-12-19 ENCOUNTER — Encounter: Payer: Self-pay | Admitting: Obstetrics & Gynecology

## 2016-12-19 ENCOUNTER — Ambulatory Visit: Payer: BC Managed Care – PPO | Admitting: Obstetrics & Gynecology

## 2017-01-12 ENCOUNTER — Other Ambulatory Visit (HOSPITAL_COMMUNITY)
Admission: RE | Admit: 2017-01-12 | Discharge: 2017-01-12 | Disposition: A | Discharge status: Home / Self Care | Payer: BC Managed Care – PPO | Source: Ambulatory Visit | Attending: Obstetrics & Gynecology | Admitting: Obstetrics & Gynecology

## 2017-01-12 ENCOUNTER — Encounter: Payer: Self-pay | Admitting: Obstetrics & Gynecology

## 2017-01-12 ENCOUNTER — Ambulatory Visit (INDEPENDENT_AMBULATORY_CARE_PROVIDER_SITE_OTHER): Payer: BC Managed Care – PPO | Admitting: Obstetrics & Gynecology

## 2017-01-12 ENCOUNTER — Other Ambulatory Visit: Payer: Self-pay

## 2017-01-12 VITALS — BP 122/84 | HR 72 | Resp 14 | Ht 63.75 in | Wt 174.5 lb

## 2017-01-12 DIAGNOSIS — Z01419 Encounter for gynecological examination (general) (routine) without abnormal findings: Secondary | ICD-10-CM | POA: Diagnosis not present

## 2017-01-12 DIAGNOSIS — E559 Vitamin D deficiency, unspecified: Secondary | ICD-10-CM | POA: Insufficient documentation

## 2017-01-12 DIAGNOSIS — Z124 Encounter for screening for malignant neoplasm of cervix: Secondary | ICD-10-CM

## 2017-01-12 DIAGNOSIS — Z202 Contact with and (suspected) exposure to infections with a predominantly sexual mode of transmission: Secondary | ICD-10-CM | POA: Diagnosis present

## 2017-01-12 DIAGNOSIS — N898 Other specified noninflammatory disorders of vagina: Secondary | ICD-10-CM | POA: Diagnosis present

## 2017-01-12 MED ORDER — PHENTERMINE HCL 15 MG PO CAPS: 15.0000 mg | ORAL_CAPSULE | ORAL | 1 refills | Status: DC

## 2017-01-12 NOTE — Progress Notes (Signed)
37 y.o. G1P1 SingleAfrican AmericanF here for annual exam.  Doing well.  Son is a Medical laboratory scientific officer.  Doing well.  Holiday was nice.    Cycles are regular.    Has two classes left for her masters degree.  Patient's last menstrual period was 01/03/2017.          Sexually active: No.  The current method of family planning is none.    Exercising: Yes.    walking Smoker:  no  Health Maintenance: Pap:  01/10/16 Neg. HR HPV:neg   12/28/14 ASCUS. HR HPV:+detected. ECC:Neg  History of abnormal Pap:  Yes, LEEP 2011 MMG:  Never TDaP: 08/2012 Screening Labs: discuss with provider, Hb today: discuss with provider, Urine today: not collected    reports that  has never smoked. she has never used smokeless tobacco. She reports that she drinks alcohol. She reports that she does not use drugs.  Past Medical History:  Diagnosis Date  . Abnormal Pap smear   . Anemia   . Pilonidal cyst     Past Surgical History:  Procedure Laterality Date  . LEEP     CIN II    Current Outpatient Medications  Medication Sig Dispense Refill  . phentermine 15 MG capsule Take 1 capsule (15 mg total) by mouth every morning. 30 capsule 1   No current facility-administered medications for this visit.     Family History  Problem Relation Age of Onset  . Lung cancer Paternal Grandmother        smoker  . Hypertension Paternal Grandmother   . Hypothyroidism Paternal Grandmother   . Hypertension Father   . Congestive Heart Failure Father        had been on dialysis x 10 years  . Hypertension Paternal Grandfather   . Kidney failure Paternal Grandfather   . Stroke Maternal Grandmother     ROS:  Pertinent items are noted in HPI.  Otherwise, a comprehensive ROS was negative.  Exam:   BP 122/84 (BP Location: Right Arm, Patient Position: Sitting, Cuff Size: Normal)   Pulse 72   Resp 14   Ht 5' 3.75" (1.619 m)   Wt 174 lb 8 oz (79.2 kg)   LMP 01/03/2017   BMI 30.19 kg/m   Weight change: -20# (from last year's exam)   Height: 5' 3.75" (161.9 cm)  Ht Readings from Last 3 Encounters:  01/12/17 5' 3.75" (1.619 m)  10/14/16 5' 3.5" (1.613 m)  08/17/16 5\' 3"  (1.6 m)    General appearance: alert, cooperative and appears stated age Head: Normocephalic, without obvious abnormality, atraumatic Neck: no adenopathy, supple, symmetrical, trachea midline and thyroid normal to inspection and palpation Lungs: clear to auscultation bilaterally Breasts: normal appearance, no masses or tenderness Heart: regular rate and rhythm Abdomen: soft, non-tender; bowel sounds normal; no masses,  no organomegaly Extremities: extremities normal, atraumatic, no cyanosis or edema Skin: Skin color, texture, turgor normal. No rashes or lesions Lymph nodes: Cervical, supraclavicular, and axillary nodes normal. No abnormal inguinal nodes palpated Neurologic: Grossly normal   Pelvic: External genitalia:  no lesions              Urethra:  normal appearing urethra with no masses, tenderness or lesions              Bartholins and Skenes: normal                 Vagina: normal appearing vagina with normal color and discharge, no lesions  Cervix: no lesions              Pap taken: Yes.   Bimanual Exam:  Uterus:  normal size, contour, position, consistency, mobility, non-tender              Adnexa: normal adnexa and no mass, fullness, tenderness               Rectovaginal: Confirms               Anus:  normal sphincter tone, no lesions  Chaperone was present for exam.  A:  Well Woman with normal exam Desired weight loss H/o abnormal pap smear, CIN 2, s/p LEEP.  H/O ASCUS with +HR HPV 12/16  P:   Mammogram guidelines reviewed.  Will start at age 37. Vit D level will be obtained today pap smear with HR HPV obtained today HIV and RPR Vaginal testing for BV, yeast, trich, GC and chlamydia obtained today RF for phentermine 15mg  daily.  #30/1RF.  Reviewed risks.  Pt will need recheck 2 months if need additional RF but may be  at goal by that time. Return annually or prn

## 2017-01-13 LAB — VITAMIN D 25 HYDROXY (VIT D DEFICIENCY, FRACTURES): VIT D 25 HYDROXY: 7.7 ng/mL — AB (ref 30.0–100.0)

## 2017-01-13 LAB — RPR: RPR Ser Ql: NONREACTIVE

## 2017-01-13 LAB — HIV ANTIBODY (ROUTINE TESTING W REFLEX): HIV SCREEN 4TH GENERATION: NONREACTIVE

## 2017-01-14 LAB — NUSWAB VAGINITIS PLUS (VG+)
CHLAMYDIA TRACHOMATIS, NAA: NEGATIVE
Candida albicans, NAA: NEGATIVE
Candida glabrata, NAA: NEGATIVE
Neisseria gonorrhoeae, NAA: NEGATIVE
TRICH VAG BY NAA: NEGATIVE

## 2017-01-16 LAB — CYTOLOGY - PAP
DIAGNOSIS: NEGATIVE
HPV 16/18/45 GENOTYPING: NEGATIVE
HPV: DETECTED — AB

## 2017-01-18 ENCOUNTER — Other Ambulatory Visit: Payer: Self-pay | Admitting: Obstetrics & Gynecology

## 2017-01-18 DIAGNOSIS — E559 Vitamin D deficiency, unspecified: Secondary | ICD-10-CM | POA: Insufficient documentation

## 2017-01-19 ENCOUNTER — Other Ambulatory Visit: Payer: Self-pay | Admitting: *Deleted

## 2017-01-19 ENCOUNTER — Telehealth: Payer: Self-pay | Admitting: Obstetrics & Gynecology

## 2017-01-19 DIAGNOSIS — R87810 Cervical high risk human papillomavirus (HPV) DNA test positive: Secondary | ICD-10-CM

## 2017-01-19 MED ORDER — VITAMIN D (ERGOCALCIFEROL) 1.25 MG (50000 UNIT) PO CAPS: 50000.0000 [IU] | ORAL_CAPSULE | ORAL | 0 refills | Status: DC

## 2017-01-19 NOTE — Telephone Encounter (Signed)
Spoke with patient regarding benefit for colposcopy.  Patient understood and agreeable. Advised she will need to call at the onset of her to schedule. Patient acknowledged understanding and is agreeable to call when she starts her cycle, for scheduling No further questions. Ok to close encounter.  Forwarding to Dr Hyacinth MeekerMiller for final review  cc: Carmelina DaneJill Hamm, RN

## 2017-02-11 ENCOUNTER — Telehealth: Payer: Self-pay | Admitting: Obstetrics & Gynecology

## 2017-02-11 NOTE — Telephone Encounter (Signed)
Spoke with patient. Calling to schedule colpo. LMP 02/11/17. Contraceptive, abstinence. Colpo scheduled for 02/19/17 at 1:30pm with Dr. Hyacinth MeekerMiller. Advised to take Motrin 800 mg with food and water one hour before procedure. Patient has been called with benefits.  Patient requesting RX for Valium. Patient states she has taken this before prior to colposcopy in 2017.   Advised patient would need to come in prior to day of procedure to sign consent, will need a driver day of procedure. Will review RX with Dr. Hyacinth MeekerMiller and return call, patient is agreeable.   Dr. Hyacinth MeekerMiller -please advise on Valium RX?   Cc: Harland DingwallSuzy Dixon

## 2017-02-11 NOTE — Telephone Encounter (Signed)
Patient was told to call when her cycle started to have a procedure. Patient does not know the name of the procedure.

## 2017-02-11 NOTE — Telephone Encounter (Signed)
Call returned, no answer, mailbox full, unable to leave message.   Needs to schedule colposcopy with Dr. Hyacinth MeekerMiller -see pap results dated 01/12/17

## 2017-02-13 ENCOUNTER — Other Ambulatory Visit: Payer: Self-pay | Admitting: Obstetrics & Gynecology

## 2017-02-13 MED ORDER — DIAZEPAM 5 MG PO TABS: ORAL_TABLET | ORAL | 0 refills | Status: DC

## 2017-02-13 NOTE — Telephone Encounter (Signed)
Spoke with patient, advised as seen below per Dr. Hyacinth MeekerMiller. Advised to ask for triage when she comes in to sign her consent. Patient verbalizes understanding and is agreeable.   Routing to provider for final review. Patient is agreeable to disposition. Will close encounter.

## 2017-02-13 NOTE — Telephone Encounter (Signed)
Prescription has been done.  She can come an hour or more early to her appt, sign consent and then take the valium.  [facility]'t want to [facility] and then come back for appt--if she doesn't want to come and earlier day to sign the consent form.  She will need a driver as well.

## 2017-02-19 ENCOUNTER — Telehealth: Payer: Self-pay | Admitting: *Deleted

## 2017-02-19 ENCOUNTER — Encounter: Payer: Self-pay | Admitting: Obstetrics & Gynecology

## 2017-02-19 ENCOUNTER — Ambulatory Visit (INDEPENDENT_AMBULATORY_CARE_PROVIDER_SITE_OTHER): Payer: BC Managed Care – PPO | Admitting: Obstetrics & Gynecology

## 2017-02-19 ENCOUNTER — Other Ambulatory Visit: Payer: Self-pay | Admitting: Obstetrics & Gynecology

## 2017-02-19 DIAGNOSIS — R87810 Cervical high risk human papillomavirus (HPV) DNA test positive: Secondary | ICD-10-CM | POA: Diagnosis not present

## 2017-02-19 MED ORDER — DIAZEPAM 5 MG PO TABS: ORAL_TABLET | ORAL | 0 refills | Status: DC

## 2017-02-19 NOTE — Telephone Encounter (Signed)
Patient states she is scheduled for colpo today at 1:30pm with Dr. Hyacinth MeekerMiller, [facility]. Advised patient will f/u and return call, patient is agreeable.   Reviewed with Dr. Hyacinth MeekerMiller -[facility]   Call returned to patient. Patient will f/u with pharmacy for filling. Patient request to call pharmacy and advise will be there in 30 min to pick up, go ahead and fill.  [facility], confirmed [facility] received, patient will be there to pick up med in 30 min. Was advised RX received, med will be available for pick up.   Routing to provider for final review. Patient is agreeable to disposition. Will close encounter.

## 2017-02-19 NOTE — Progress Notes (Signed)
37 y.o. G1P1 single African-American female here for colposcopy with possible biopsies and possible ECC due to abnormal Pap smear that was obtained at her annual exam on 01/12/17 showing positive high-risk HPV.  Patient had ASCUS Pap with positive high-risk HPV 12/28/14.  Subsequent colposcopy showed no abnormal findings and ECC was negative.  Her Pap and high-risk HPV 01/10/16 were both normal/negative.  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    Patient's last menstrual period was 02/11/2017.          Sexually active: No.  The current method of family planning is abstinence.     Patient has been counseled about results and procedure.  Risks and benefits have bene reviewed including immediate and/or delayed bleeding, infection, cervical scaring from procedure, possibility of needing additional follow up as well as treatment.  rare risks of missing a lesion discussed as well.  All questions answered.  Pt ready to proceed.  BP 118/80 (BP Location: Right Arm, Patient Position: Sitting, Cuff Size: Normal)   Pulse 92   Resp 16   Wt 180 lb (81.6 kg)   LMP 02/11/2017   BMI 31.14 kg/m   Physical Exam  Constitutional: She is oriented to person, place, and time. She appears well-developed and well-nourished.  Genitourinary: Vagina normal. There is no rash, tenderness, lesion or injury on the right labia. There is no rash, tenderness, lesion or injury on the left labia.    Lymphadenopathy:       Right: No inguinal adenopathy present.       Left: No inguinal adenopathy present.  Neurological: She is alert and oriented to person, place, and time.  Skin: Skin is warm and dry.  Psychiatric: She has a normal mood and affect.    Speculum placed.  3% acetic acid applied to cervix for >45 seconds.  Cervix visualized with both 7.5X and 15X magnification.  Green filter also used.  Lugols solution was used.  Findings:  Small area at 5 o'clock with HPV changes.  Biopsy:  Same location.  ECC:   was performed.  Monsel's was needed.  Excellent hemostasis was present.  Pt tolerated procedure well and all instruments were removed.  Findings noted above on picture of cervix.  Assessment:  +HR HPV in pt with prior ASCUS pap with +HR HPV 12/16.    Plan:  Pathology results will be called to patient and follow-up planned pending results.

## 2017-02-19 NOTE — Patient Instructions (Signed)

## 2017-02-23 ENCOUNTER — Telehealth: Payer: Self-pay | Admitting: *Deleted

## 2017-02-23 NOTE — Telephone Encounter (Signed)
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  Ok to watch this and repeat pap and HR HPV 1 year.  Needs 08 recall.

## 2017-02-23 NOTE — Telephone Encounter (Signed)
Patient returned call. Results reviewed with patient and she verbalized understanding. 08 recall placed for 02/2018. AEX scheduled for 03/19/18.   Patient agreeable to disposition. Will close encounter.

## 2017-02-23 NOTE — Telephone Encounter (Signed)
Attempted to reach patient. Mailbox full, unable to reach patient.

## 2017-03-20 ENCOUNTER — Telehealth: Payer: Self-pay | Admitting: *Deleted

## 2017-03-20 NOTE — Telephone Encounter (Signed)
Spoke with patient. Patient has picked up RX for phentermine, paid out of pocket, no PA needed.   Routing to provider for final review. Patient is agreeable to disposition. Will close encounter.

## 2017-03-20 NOTE — Telephone Encounter (Signed)
No answer, unable to leave message.   PA request received for phentermine 15mg  capsule.

## 2017-05-13 ENCOUNTER — Other Ambulatory Visit: Payer: BC Managed Care – PPO

## 2017-05-13 DIAGNOSIS — E559 Vitamin D deficiency, unspecified: Secondary | ICD-10-CM

## 2017-05-14 ENCOUNTER — Other Ambulatory Visit: Payer: Self-pay

## 2017-05-14 LAB — VITAMIN D 25 HYDROXY (VIT D DEFICIENCY, FRACTURES): VIT D 25 HYDROXY: 41.2 ng/mL (ref 30.0–100.0)

## 2018-01-11 ENCOUNTER — Other Ambulatory Visit: Payer: Self-pay | Admitting: Obstetrics & Gynecology

## 2018-03-19 ENCOUNTER — Other Ambulatory Visit: Payer: Self-pay

## 2018-03-19 ENCOUNTER — Encounter: Payer: Self-pay | Admitting: Obstetrics & Gynecology

## 2018-03-19 ENCOUNTER — Other Ambulatory Visit (HOSPITAL_COMMUNITY)
Admission: RE | Admit: 2018-03-19 | Discharge: 2018-03-19 | Disposition: A | Discharge status: Home / Self Care | Payer: BC Managed Care – PPO | Source: Ambulatory Visit | Attending: Obstetrics & Gynecology | Admitting: Obstetrics & Gynecology

## 2018-03-19 ENCOUNTER — Ambulatory Visit: Payer: BC Managed Care – PPO | Admitting: Obstetrics & Gynecology

## 2018-03-19 ENCOUNTER — Encounter

## 2018-03-19 VITALS — BP 128/80 | HR 92 | Resp 16 | Ht 64.0 in | Wt 200.8 lb

## 2018-03-19 DIAGNOSIS — B977 Papillomavirus as the cause of diseases classified elsewhere: Secondary | ICD-10-CM

## 2018-03-19 DIAGNOSIS — Z124 Encounter for screening for malignant neoplasm of cervix: Secondary | ICD-10-CM

## 2018-03-19 DIAGNOSIS — Z862 Personal history of diseases of the blood and blood-forming organs and certain disorders involving the immune mechanism: Secondary | ICD-10-CM

## 2018-03-19 DIAGNOSIS — Z0000 Encounter for general adult medical examination without abnormal findings: Secondary | ICD-10-CM

## 2018-03-19 DIAGNOSIS — Z6834 Body mass index (BMI) 34.0-34.9, adult: Secondary | ICD-10-CM

## 2018-03-19 DIAGNOSIS — Z01419 Encounter for gynecological examination (general) (routine) without abnormal findings: Secondary | ICD-10-CM

## 2018-03-19 NOTE — Progress Notes (Signed)
38 y.o. G1P1 Single Black or [place] American female here for annual exam.  Doing well.  Cycles are regular.  Would like ferritin checked today with routine labs as well.  Frustrated with weight again.  Has used phentermine but always gains weight back.  Dr. Francena Hanly program discussed.  Will have more time to focus on this in the summer but with current weight list, feel referral could be done now.  She is in agreement.    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  Paps normalized for a few years but have been as follows since:  12/28/14 AEX ASCUS with +HR HPV 01/09/15 Colposcopy with ECC only.  ECC was negative [id number] 01/10/2016.  Pap negative with neg HR HPV 01/22/17 AEX with neg pap but + high-risk HPV.   Colpo 02/19/2017.  Biopsy showed CIN 1 and ECC was negative    Patient's last menstrual period was 02/22/2018 (exact date).          Sexually active: No.  The current method of family planning is none.    Exercising: No.   Smoker:  no  Health Maintenance: Pap:  01/12/17 Neg. HR HPV:+detected  01/10/16 Neg. HR HPV:neg  History of abnormal Pap:  Yes, LEEP 2011. CIN 1 MMG:  Never Colonoscopy:  Never BMD:   Never TDaP:  2014 Screening Labs: Here today    reports that she has never smoked. She has never used smokeless tobacco. She reports current alcohol use. She reports that she does not use drugs.  Past Medical History:  Diagnosis Date  . Abnormal Pap smear   . Anemia   . Pilonidal cyst     Past Surgical History:  Procedure Laterality Date  . LEEP     CIN II    Current Outpatient Medications  Medication Sig Dispense Refill  . Multiple Vitamin (MULTIVITAMIN) tablet Take 1 tablet by mouth daily.     No current facility-administered medications for this visit.     Family History  Problem Relation Age of Onset  . Lung cancer Paternal Grandmother        smoker  . Hypertension Paternal Grandmother   . Hypothyroidism Paternal Grandmother   . Hypertension Father    . Congestive Heart Failure Father        had been on dialysis x 10 years  . Hypertension Paternal Grandfather   . Kidney failure Paternal Grandfather   . Stroke Maternal Grandmother     Review of Systems  Constitutional:       Weight gain   Endocrine:       Craving sweets   All other systems reviewed and are negative.   Exam:   BP 128/80 (BP Location: Right Arm, Patient Position: Sitting, Cuff Size: Normal)   Pulse 92   Resp 16   Ht 5\' 4"  (1.626 m)   Wt 200 lb 12.8 oz (91.1 kg)   LMP 02/22/2018 (Exact Date)   BMI 34.47 kg/m   Height: 5\' 4"  (162.6 cm)  Ht Readings from Last 3 Encounters:  03/19/18 5\' 4"  (1.626 m)  01/12/17 5' 3.75" (1.619 m)  10/14/16 5' 3.5" (1.613 m)    General appearance: alert, cooperative and appears stated age Head: Normocephalic, without obvious abnormality, atraumatic Neck: no adenopathy, supple, symmetrical, trachea midline and thyroid normal to inspection and palpation Lungs: clear to auscultation bilaterally Breasts: normal appearance, no masses or tenderness Heart: regular rate and rhythm Abdomen: soft, non-tender; bowel sounds normal; no masses,  no organomegaly  Extremities: extremities normal, atraumatic, no cyanosis or edema Skin: Skin color, texture, turgor normal. No rashes or lesions Lymph nodes: Cervical, supraclavicular, and axillary nodes normal. No abnormal inguinal nodes palpated Neurologic: Grossly normal   Pelvic: External genitalia:  no lesions              Urethra:  normal appearing urethra with no masses, tenderness or lesions              Bartholins and Skenes: normal                 Vagina: normal appearing vagina with normal color and discharge, no lesions              Cervix: no lesions              Pap taken: Yes.   Bimanual Exam:  Uterus:  normal size, contour, position, consistency, mobility, non-tender              Adnexa: normal adnexa and no mass, fullness, tenderness               Rectovaginal: Confirms                Anus:  normal sphincter tone, no lesions  Chaperone was present for exam.  A:  Well Woman with normal exam H/O abnormal pap smear with CIN 2, s/p LEEP, then ASCUS pap with + HR HPV 12/16, still in follow up from then BMI 34.5 Not SA H/O low Vit D  P:   Mammogram guidelines reviewed.  Knows to start this year.  Information given pap smear and HR HPV obtained today Referral to weight management program CBC, ferritin, CMP, Lipids, TSH, and VIt D obtained today Tdap UTD Return annually or prn

## 2018-03-20 LAB — COMPREHENSIVE METABOLIC PANEL
A/G RATIO: 1.8 (ref 1.2–2.2)
ALT: 15 IU/L (ref 0–32)
AST: 12 IU/L (ref 0–40)
Albumin: 4.6 g/dL (ref 3.8–4.8)
Alkaline Phosphatase: 76 IU/L (ref 39–117)
BUN/Creatinine Ratio: 15 (ref 9–23)
BUN: 9 mg/dL (ref 6–24)
Bilirubin Total: 0.2 mg/dL (ref 0.0–1.2)
CO2: 22 mmol/L (ref 20–29)
Calcium: 9.2 mg/dL (ref 8.7–10.2)
Chloride: 102 mmol/L (ref 96–106)
Creatinine, Ser: 0.62 mg/dL (ref 0.57–1.00)
GFR calc Af Amer: 130 mL/min/{1.73_m2} (ref >59)
GFR calc non Af Amer: 113 mL/min/{1.73_m2} (ref >59)
Globulin, Total: 2.5 g/dL (ref 1.5–4.5)
Glucose: 81 mg/dL (ref 65–99)
Potassium: 4 mmol/L (ref 3.5–5.2)
Sodium: 139 mmol/L (ref 134–144)
Total Protein: 7.1 g/dL (ref 6.0–8.5)

## 2018-03-20 LAB — CBC
HEMOGLOBIN: 11.5 g/dL (ref 11.1–15.9)
Hematocrit: 35.9 % (ref 34.0–46.6)
MCH: 29.3 pg (ref 26.6–33.0)
MCHC: 32 g/dL (ref 31.5–35.7)
MCV: 92 fL (ref 79–97)
PLATELETS: 356 10*3/uL (ref 150–450)
RBC: 3.92 x10E6/uL (ref 3.77–5.28)
RDW: 13.6 % (ref 11.7–15.4)
WBC: 10.4 10*3/uL (ref 3.4–10.8)

## 2018-03-20 LAB — LIPID PANEL
CHOL/HDL RATIO: 3 ratio (ref 0.0–4.4)
Cholesterol, Total: 168 mg/dL (ref 100–199)
HDL: 56 mg/dL (ref >39)
LDL Calculated: 99 mg/dL (ref 0–99)
TRIGLYCERIDES: 65 mg/dL (ref 0–149)
VLDL Cholesterol Cal: 13 mg/dL (ref 5–40)

## 2018-03-20 LAB — VITAMIN D 25 HYDROXY (VIT D DEFICIENCY, FRACTURES): Vit D, 25-Hydroxy: 12.7 ng/mL — ABNORMAL LOW (ref 30.0–100.0)

## 2018-03-20 LAB — TSH: TSH: 1.18 u[IU]/mL (ref 0.450–4.500)

## 2018-03-20 LAB — FERRITIN: Ferritin: 23 ng/mL (ref 15–150)

## 2018-03-21 ENCOUNTER — Encounter: Payer: Self-pay | Admitting: Obstetrics & Gynecology

## 2018-03-22 ENCOUNTER — Telehealth: Payer: Self-pay | Admitting: *Deleted

## 2018-03-22 MED ORDER — VITAMIN D (ERGOCALCIFEROL) 1.25 MG (50000 UNIT) PO CAPS: 50000.0000 [IU] | ORAL_CAPSULE | ORAL | 0 refills | Status: DC

## 2018-03-22 NOTE — Telephone Encounter (Signed)
Pt notified. Verbalized understanding. Rx sent to pharmacy.  

## 2018-03-22 NOTE — Telephone Encounter (Signed)
Called patient. Unable to leave message. Voicemail full.  

## 2018-03-22 NOTE — Telephone Encounter (Signed)
-----   Message from Jerene Bears, MD sent at 03/21/2018  5:29 PM EDT ----- Please let pt know her CBC, CMP, Lipids, TSH and ferritin were all normal.  Her Vit D was low again.  Needs 50K weekly for 12 weeks.  As this helped improved Vit D last year, then she needs to change to 2000 IU daily after that point.  Will recheck next year.  Pap is still pending.

## 2018-03-23 LAB — CYTOLOGY - PAP
ADEQUACY: ABSENT
DIAGNOSIS: NEGATIVE
HPV: NOT DETECTED

## 2018-06-29 ENCOUNTER — Encounter (INDEPENDENT_AMBULATORY_CARE_PROVIDER_SITE_OTHER): Payer: Self-pay | Admitting: Family Medicine

## 2018-06-29 ENCOUNTER — Ambulatory Visit (INDEPENDENT_AMBULATORY_CARE_PROVIDER_SITE_OTHER): Payer: BC Managed Care – PPO | Admitting: Family Medicine

## 2018-06-29 ENCOUNTER — Other Ambulatory Visit: Payer: Self-pay

## 2018-06-29 VITALS — BP 103/68 | HR 62 | Temp 98.5°F | Ht 64.0 in | Wt 194.0 lb

## 2018-06-29 DIAGNOSIS — Z9189 Other specified personal risk factors, not elsewhere classified: Secondary | ICD-10-CM | POA: Diagnosis not present

## 2018-06-29 DIAGNOSIS — R5383 Other fatigue: Secondary | ICD-10-CM | POA: Diagnosis not present

## 2018-06-29 DIAGNOSIS — R0602 Shortness of breath: Secondary | ICD-10-CM

## 2018-06-29 DIAGNOSIS — E538 Deficiency of other specified B group vitamins: Secondary | ICD-10-CM

## 2018-06-29 DIAGNOSIS — Z1331 Encounter for screening for depression: Secondary | ICD-10-CM | POA: Diagnosis not present

## 2018-06-29 DIAGNOSIS — D508 Other iron deficiency anemias: Secondary | ICD-10-CM | POA: Diagnosis not present

## 2018-06-29 DIAGNOSIS — E559 Vitamin D deficiency, unspecified: Secondary | ICD-10-CM | POA: Diagnosis not present

## 2018-06-29 DIAGNOSIS — E669 Obesity, unspecified: Secondary | ICD-10-CM

## 2018-06-29 DIAGNOSIS — Z6833 Body mass index (BMI) 33.0-33.9, adult: Secondary | ICD-10-CM

## 2018-06-29 DIAGNOSIS — Z0289 Encounter for other administrative examinations: Secondary | ICD-10-CM

## 2018-06-30 LAB — CBC WITH DIFFERENTIAL
Basophils Absolute: 0.1 10*3/uL (ref 0.0–0.2)
Basos: 1 %
EOS (ABSOLUTE): 0.1 10*3/uL (ref 0.0–0.4)
Eos: 2 %
Hematocrit: 39.4 % (ref 34.0–46.6)
Hemoglobin: 12.7 g/dL (ref 11.1–15.9)
Immature Grans (Abs): 0 10*3/uL (ref 0.0–0.1)
Immature Granulocytes: 0 %
Lymphocytes Absolute: 2.1 10*3/uL (ref 0.7–3.1)
Lymphs: 24 %
MCH: 29.4 pg (ref 26.6–33.0)
MCHC: 32.2 g/dL (ref 31.5–35.7)
MCV: 91 fL (ref 79–97)
Monocytes Absolute: 0.5 10*3/uL (ref 0.1–0.9)
Monocytes: 5 %
Neutrophils Absolute: 6 10*3/uL (ref 1.4–7.0)
Neutrophils: 68 %
RBC: 4.32 x10E6/uL (ref 3.77–5.28)
RDW: 13.7 % (ref 11.7–15.4)
WBC: 8.7 10*3/uL (ref 3.4–10.8)

## 2018-06-30 LAB — LIPID PANEL WITH LDL/HDL RATIO
Cholesterol, Total: 169 mg/dL (ref 100–199)
HDL: 47 mg/dL (ref >39)
LDL Calculated: 107 mg/dL — ABNORMAL HIGH (ref 0–99)
LDl/HDL Ratio: 2.3 ratio (ref 0.0–3.2)
Triglycerides: 76 mg/dL (ref 0–149)
VLDL Cholesterol Cal: 15 mg/dL (ref 5–40)

## 2018-06-30 LAB — HEMOGLOBIN A1C
Est. average glucose Bld gHb Est-mCnc: 117 mg/dL
Hgb A1c MFr Bld: 5.7 % — ABNORMAL HIGH (ref 4.8–5.6)

## 2018-06-30 LAB — COMPREHENSIVE METABOLIC PANEL
ALT: 13 IU/L (ref 0–32)
AST: 15 IU/L (ref 0–40)
Albumin/Globulin Ratio: 1.6 (ref 1.2–2.2)
Albumin: 4.5 g/dL (ref 3.8–4.8)
Alkaline Phosphatase: 72 IU/L (ref 39–117)
BUN/Creatinine Ratio: 12 (ref 9–23)
BUN: 8 mg/dL (ref 6–24)
Bilirubin Total: 0.3 mg/dL (ref 0.0–1.2)
CO2: 21 mmol/L (ref 20–29)
Calcium: 9.6 mg/dL (ref 8.7–10.2)
Chloride: 103 mmol/L (ref 96–106)
Creatinine, Ser: 0.68 mg/dL (ref 0.57–1.00)
GFR calc Af Amer: 127 mL/min/{1.73_m2} (ref >59)
GFR calc non Af Amer: 110 mL/min/{1.73_m2} (ref >59)
Globulin, Total: 2.8 g/dL (ref 1.5–4.5)
Glucose: 85 mg/dL (ref 65–99)
Potassium: 4.1 mmol/L (ref 3.5–5.2)
Sodium: 139 mmol/L (ref 134–144)
Total Protein: 7.3 g/dL (ref 6.0–8.5)

## 2018-06-30 LAB — T4, FREE: Free T4: 1.02 ng/dL (ref 0.82–1.77)

## 2018-06-30 LAB — VITAMIN D 25 HYDROXY (VIT D DEFICIENCY, FRACTURES): Vit D, 25-Hydroxy: 22.3 ng/mL — ABNORMAL LOW (ref 30.0–100.0)

## 2018-06-30 LAB — INSULIN, RANDOM: INSULIN: 13.1 u[IU]/mL (ref 2.6–24.9)

## 2018-06-30 LAB — TSH: TSH: 0.581 u[IU]/mL (ref 0.450–4.500)

## 2018-06-30 LAB — T3: T3, Total: 107 ng/dL (ref 71–180)

## 2018-06-30 LAB — FOLATE: Folate: 11.6 ng/mL (ref >3.0)

## 2018-06-30 LAB — VITAMIN B12: Vitamin B-12: 1435 pg/mL — ABNORMAL HIGH (ref 232–1245)

## 2018-06-30 NOTE — Progress Notes (Signed)
Office: [phone]  /  Fax: [phone]   Dear Dr. Jerene BearsMary S. Miller,   Thank you for referring Katie Curtis to our clinic. The following note includes my evaluation and treatment recommendations.  HPI:   Chief Complaint: OBESITY    Katie Curtis has been referred by Jerene BearsMary S. Miller, MD for consultation regarding her obesity and obesity related comorbidities.    Katie Curtis (MR# [id number]) is a 38 y.o. female who presents on 06/29/2018 for obesity evaluation and treatment. Current BMI is Body mass index is 33.3 kg/m.  Katie Curtis has been struggling with her weight for many years and has been unsuccessful in either losing weight, maintaining weight loss, or reaching her healthy weight goal.     Katie Curtis attended our information session and states she is currently in the action stage of change and ready to dedicate time achieving and maintaining a healthier weight. Katie Curtis is interested in becoming our patient and working on intensive lifestyle modifications including (but not limited to) diet, exercise, and weight loss.    Katie Curtis states she thinks her family will eat healthier with her her desired weight loss is 38 lbs she started gaining weight 2 years ago her heaviest weight ever was 207 lbs she skips lunch frequently she is frequently drinking liquids with calories she frequently makes poor food choices she has problems with excessive hunger  she frequently eats larger portions than normal  she has binge eating behaviors she struggles with emotional eating    Fatigue Katie Curtis feels her energy is lower than it should be. This has worsened with weight gain and has not worsened recently. Katie Curtis admits to waking up still tired. Patient is at risk for obstructive sleep apnea. Patient generally gets 6 hours of sleep per night, and states they generally have restless sleep. Snoring is not present. Apneic episodes are not present. Epworth Sleepiness Score is 6.  Dyspnea on exertion Katie Curtis  notes increasing shortness of breath with exercising and seems to be worsening over time with weight gain. She notes getting out of breath sooner with activity than she used to. This has not gotten worse recently.   Vitamin D Deficiency Katie Curtis has a diagnosis of vitamin D deficiency. She is not currently on vit D and her last level was very low. Katie Curtis admits fatigue.  Iron Deficiency Anemia Katie Curtis has a diagnosis of anemia and she is not on iron supplementation. Her last hemoglobin was low. She admits fatigue and denies palpitations. Katie Curtis has not had weight loss surgery.  Vitamin B12 Deficiency Katie Curtis has a diagnosis of B12 insufficiency and notes fatigue. This is not a new diagnosis. 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 She does not have a history of weight loss surgery.   At risk for cardiovascular disease Katie Curtis is at a higher than average risk for cardiovascular disease due to anemia, vitamin D deficiency, and obesity. She currently denies any chest pain.  Depression Screen Katie Curtis's Food and Mood (modified PHQ-9) score was 18. Depression screen [facility] 06/29/2018  Decreased Interest 2  Down, Depressed, Hopeless 2  PHQ - 2 Score 4  Altered sleeping 2  Tired, decreased energy 3  Change in appetite 3  Feeling bad or failure about yourself  3  Trouble concentrating 2  Moving slowly or fidgety/restless 1  Suicidal thoughts 0  PHQ-9 Score 18  Difficult doing work/chores Not difficult at all   ASSESSMENT AND PLAN:  Other fatigue - Plan: EKG  12-Lead, Comprehensive metabolic panel, Folate, Hemoglobin A1c, Insulin, random, T3, T4, free, TSH, CBC With Differential  Shortness of breath on exertion - Plan: Lipid Panel With LDL/HDL Ratio  Vitamin D deficiency - Plan: VITAMIN D 25 Hydroxy (Vit-D Deficiency, Fractures)  Other iron deficiency anemia - Plan: Anemia panel  B12 nutritional deficiency - Plan: Vitamin B12  Depression screening   At risk for heart disease  Class 1 obesity with serious comorbidity and body mass index (BMI) of 33.0 to 33.9 in adult, unspecified obesity type  PLAN:  Fatigue Katie Curtis was informed that her fatigue may be related to obesity, depression or many other causes. Labs will be ordered, and in the meanwhile Katie Curtis has agreed to work on diet, exercise and weight loss to help with fatigue. Proper sleep hygiene was discussed including the need for 7-8 hours of quality sleep each night. A sleep study was not ordered based on symptoms and Epworth score. An EKG and an indirect calorimetry was ordered today.  Dyspnea on exertion Ginamarie's shortness of breath appears to be obesity related and exercise induced. She has agreed to work on weight loss and gradually increase exercise to treat her exercise induced shortness of breath. If Katie Curtis follows our instructions and loses weight without improvement of her shortness of breath, we will plan to refer to pulmonology. We will monitor this condition regularly. Katie Curtis agrees to this plan.  Vitamin D Deficiency Katie Curtis was informed that low vitamin D levels contributes to fatigue and are associated with obesity, breast, and colon cancer. We will order labs today and she agrees to follow up in 2 weeks.  Iron Deficiency Anemia The diagnosis of iron deficiency anemia was discussed with Katie Curtis and was explained in detail. She was given suggestions of iron rich foods and iron supplement was not prescribed. Labs will be ordered today. Katie Curtis agrees to start her diet and to follow up in 2 weeks.  Vitamin B12 Deficiency Katie Curtis will work on increasing B12 rich foods in her diet. B12 supplementation was not prescribed today. We will plan on checking labs today and she will follow up at the agreed upon time.  Cardiovascular risk counseling Katie Curtis was given extended (30 minutes) coronary artery disease prevention counseling today. She is 38 y.o. female and has risk factors for  heart disease including anemia, vitamin D deficiency, and obesity. We discussed intensive lifestyle modifications today with an emphasis on specific weight loss instructions and strategies. Pt was also informed of the importance of increasing exercise and decreasing saturated fats to help prevent heart disease.  Depression Screen Katie Curtis had a moderately positive depression screening. Depression is commonly associated with obesity and often results in emotional eating behaviors. We will monitor this closely and work on CBT to help improve the non-hunger eating patterns. Referral to Psychology may be required if no improvement is seen as she continues in our clinic.  Obesity Katie Curtis is currently in the action stage of change and her goal is to continue with weight loss efforts. I recommend Katie Curtis begin the structured treatment plan as follows:  She has agreed to follow the Category 2 plan.  Katie Curtis has been instructed to eventually work up to a goal of 150 minutes of combined cardio and strengthening exercise per week for weight loss and overall health benefits. We discussed the following Behavioral Modification Strategies today: increasing lean protein intake, decreasing simple carbohydrates, and work on meal planning and easy cooking plans.   She was informed of the importance of frequent follow up  visits to maximize her success with intensive lifestyle modifications for her multiple health conditions. She was informed we would discuss her lab results at her next visit unless there is a critical issue that needs to be addressed sooner. Katie Curtis agreed to keep her next visit at the agreed upon time to discuss these results.  ALLERGIES: Allergies  Allergen Reactions  . Hydrocodone   . Oxycodone   . Penicillins     MEDICATIONS: Current Outpatient Medications on File Prior to Visit  Medication Sig Dispense Refill  . Multiple Vitamin (MULTIVITAMIN) tablet Take 1 tablet by mouth daily.    . Vitamin  D, Ergocalciferol, (DRISDOL) 1.25 MG (50000 UT) CAPS capsule Take 1 capsule (50,000 Units total) by mouth every 7 (seven) days. (Patient not taking: Reported on 06/29/2018) 12 capsule 0   No current facility-administered medications on file prior to visit.     PAST MEDICAL HISTORY: Past Medical History:  Diagnosis Date  . Abnormal Pap smear   . Anemia   . Pilonidal cyst     PAST SURGICAL HISTORY: Past Surgical History:  Procedure Laterality Date  . LEEP     CIN II    SOCIAL HISTORY: Social History   Tobacco Use  . Smoking status: Never Smoker  . Smokeless tobacco: Never Used  Substance Use Topics  . Alcohol use: Yes    Alcohol/week: 0.0 - 1.0 standard drinks  . Drug use: No    FAMILY HISTORY: Family History  Problem Relation Age of Onset  . Lung cancer Paternal Grandmother        smoker  . Hypertension Paternal Grandmother   . Hypothyroidism Paternal Grandmother   . Congestive Heart Failure Father        had been on dialysis x 10 years  . Diabetes Father   . High Cholesterol Father   . Heart disease Father   . Kidney disease Father   . Cancer Father   . Obesity Father   . Hypertension Paternal Grandfather   . Kidney failure Paternal Grandfather   . Stroke Maternal Grandmother   . Depression Mother   . Sleep apnea Mother   . Alcoholism Mother     ROS: Review of Systems  Constitutional: Positive for malaise/fatigue.  Eyes:       Positive for vision changes.  Respiratory: Positive for shortness of breath.   Cardiovascular: Negative for palpitations.  Skin: Positive for rash.  Psychiatric/Behavioral: Positive for depression.    PHYSICAL EXAM: Blood pressure 103/68, pulse 62, temperature 98.5 F (36.9 C), temperature source Oral, height 5\' 4"  (1.626 m), weight 194 lb (88 kg), last menstrual period 06/07/2018, SpO2 96 %. Body mass index is 33.3 kg/m. Physical Exam Vitals signs reviewed.  Constitutional:      Appearance: Normal appearance. She is  obese.  HENT:     Head: Normocephalic and atraumatic.     Nose: Nose normal.  Eyes:     General: No scleral icterus.    Extraocular Movements: Extraocular movements intact.  Neck:     Musculoskeletal: Normal range of motion and neck supple.     Thyroid: No thyromegaly.     Comments: Negative for thyromegaly. Cardiovascular:     Rate and Rhythm: Normal rate and regular rhythm.  Pulmonary:     Effort: Pulmonary effort is normal. No respiratory distress.  Abdominal:     Palpations: Abdomen is soft.     Tenderness: There is no abdominal tenderness.     Comments: Positive for obesity.  Musculoskeletal:  Comments: ROM normal in all extremities.  Skin:    General: Skin is warm and dry.  Neurological:     Mental Status: She is alert and oriented to person, place, and time.     Coordination: Coordination normal.  Psychiatric:        Mood and Affect: Mood normal.        Behavior: Behavior normal.     RECENT LABS AND TESTS: BMET    Component Value Date/Time   NA 139 06/29/2018 1217   K 4.1 06/29/2018 1217   CL 103 06/29/2018 1217   CO2 21 06/29/2018 1217   GLUCOSE 85 06/29/2018 1217   GLUCOSE 96 01/10/2016 1331   BUN 8 06/29/2018 1217   CREATININE 0.68 06/29/2018 1217   CREATININE 0.71 01/10/2016 1331   CALCIUM 9.6 06/29/2018 1217   GFRNONAA 110 06/29/2018 1217   GFRAA 127 06/29/2018 1217   Lab Results  Component Value Date   HGBA1C 5.7 (H) 06/29/2018   Lab Results  Component Value Date   INSULIN 13.1 06/29/2018   CBC    Component Value Date/Time   WBC 8.7 06/29/2018 1217   WBC 10.5 01/10/2016 1331   RBC 4.32 06/29/2018 1217   RBC 4.32 01/10/2016 1331   HGB 12.7 06/29/2018 1217   HGB 11.5 (L) 12/28/2014 0842   HCT 39.4 06/29/2018 1217   PLT 356 03/19/2018 1149   MCV 91 06/29/2018 1217   MCH 29.4 06/29/2018 1217   MCH 28.5 01/10/2016 1331   MCHC 32.2 06/29/2018 1217   MCHC 31.4 (L) 01/10/2016 1331   RDW 13.7 06/29/2018 1217   LYMPHSABS 2.1 06/29/2018  1217   MONOABS 0.7 04/19/2011 1457   EOSABS 0.1 06/29/2018 1217   BASOSABS 0.1 06/29/2018 1217   Iron/TIBC/Ferritin/ %Sat    Component Value Date/Time   FERRITIN 23 03/19/2018 1336   Lipid Panel     Component Value Date/Time   CHOL 169 06/29/2018 1217   TRIG 76 06/29/2018 1217   HDL 47 06/29/2018 1217   CHOLHDL 3.0 03/19/2018 1149   CHOLHDL 2.9 01/10/2016 1331   VLDL 12 01/10/2016 1331   LDLCALC 107 (H) 06/29/2018 1217   Hepatic Function Panel     Component Value Date/Time   PROT 7.3 06/29/2018 1217   ALBUMIN 4.5 06/29/2018 1217   AST 15 06/29/2018 1217   ALT 13 06/29/2018 1217   ALKPHOS 72 06/29/2018 1217   BILITOT 0.3 06/29/2018 1217      Component Value Date/Time   TSH 0.581 06/29/2018 1217   TSH 1.180 03/19/2018 1149   TSH 1.659 12/28/2014 0909    Results for GAVYN, ZOSS (MRN [id number]) as of 06/30/2018 14:47  Ref. Range 03/19/2018 11:49  Vitamin D, 25-Hydroxy Latest Ref Range: 30.0 - 100.0 ng/mL 12.7 (L)    ECG  shows NSR with a rate of 83 BPM. INDIRECT CALORIMETER done today shows a VO2 of 156 and a REE of 1088.  Her calculated basal metabolic rate is 7253 thus her basal metabolic rate is worse than expected.  OBESITY BEHAVIORAL INTERVENTION VISIT  Today's visit was # 1  Starting weight: 194 lbs Starting date: 06/30/2018 Today's weight : Weight: 194 lb (88 kg)  Today's date: 06/30/2018 Total lbs lost to date: 0    06/29/2018  Height 5\' 4"  (1.626 m)  Weight 194 lb (88 kg)  BMI (Calculated) 33.28  BLOOD PRESSURE - SYSTOLIC 664  BLOOD PRESSURE - DIASTOLIC 68  Waist Measurement  41 inches   Body Fat %  38.4 %  Total Body Water (lbs) 74.6 lbs  RMR 1088   ASK: We discussed the diagnosis of obesity with Katie Donath today and Dellene agreed to give [place] permission to discuss obesity behavioral modification therapy today.  ASSESS: Lequisha has the diagnosis of obesity and her BMI today is 33.2. Breshae is in the action stage of change.   ADVISE:  Shelma was educated on the multiple health risks of obesity as well as the benefit of weight loss to improve her health. She was advised of the need for long term treatment and the importance of lifestyle modifications to improve her current health and to decrease her risk of future health problems.  AGREE: Multiple dietary modification options and treatment options were discussed and Melda agreed to follow the recommendations documented in the above note.  ARRANGE: Machel was educated on the importance of frequent visits to treat obesity as outlined per CMS and USPSTF guidelines and agreed to schedule her next follow up appointment today.  IKirke Corin, CMA, am acting as transcriptionist for Wilder Glade, MD   I have reviewed the above documentation for accuracy and completeness, and I agree with the above. -Quillian Quince, MD

## 2018-07-06 NOTE — Progress Notes (Signed)
Office: [phone]  /  Fax: [phone]    Date: July 13, 2018   Appointment Start Time: 12:02pm Duration: 35 minutes Provider: Glennie Isle, Psy.D. Type of Session: Intake for Individual Therapy  Location of Patient: Home Location of Provider: [facility] Type of Contact: Telepsychological Visit via [employer] WebEx  Informed Consent: Prior to proceeding with today's appointment, two pieces of identifying information were obtained from [place] to verify identity. In addition, Cristela's physical location at the time of this appointment was obtained. Biddie reported she was at home and provided the address. In the event of technical difficulties, Natoshia shared a phone number she could be reached at. Thayer Headings and this provider participated in today's telepsychological service. Also, Alysah denied anyone else being present in the room or on the WebEx appointment.   The provider's role was explained to [employer] The provider reviewed and discussed issues of confidentiality, privacy, and limits therein (e.g., reporting obligations). In addition to verbal informed consent, written informed consent for psychological services was obtained from Anna prior to the initial intake interview. Written consent included information concerning the practice, financial arrangements, and confidentiality and patients' rights. Since the clinic is not a 24/7 crisis center, mental health emergency resources were shared, and the provider explained MyChart, e-mail, voicemail, and/or other messaging systems should be utilized only for non-emergency reasons. This provider also explained that information obtained during appointments will be placed in [facility] record in a confidential manner and relevant information will be shared with other providers at [facility] that she meets with for coordination of care. Meleni verbally acknowledged understanding of the aforementioned, and  agreed to use mental health emergency resources discussed if needed. Moreover, Shemicka agreed information may be shared with other [facility] providers as needed for coordination of care. By signing the service agreement document, Erinn provided written consent for coordination of care.   Prior to initiating telepsychological services, Victorious was provided with an informed consent document, which included the development of a safety plan (i.e., an emergency contact and emergency resources) in the event of an emergency/crisis. Xochitl expressed understanding of the rationale of the safety plan and provided consent for this provider to reach out to her emergency contact in the event of an emergency/crisis. Rosaland returned the completed consent form prior to today's appointment. This provider verbally reviewed the consent form during today's appointment prior to proceeding with the appointment. Kailany verbally acknowledged understanding that she is ultimately responsible for understanding her insurance benefits as it relates to reimbursement of telepsychological and in-person services. This provider also reviewed confidentiality, as it relates to telepsychological services, as well as the rationale for telepsychological services. More specifically, this provider's clinic is limiting in-person visits due to COVID-19. Therapeutic services will resume to in-person appointments once deemed appropriate. Taleigh expressed understanding regarding the rationale for telepsychological services. In addition, this provider explained the telepsychological services informed consent document would be considered an addendum to the initial consent document/service agreement. Elizabet verbally consented to proceed.   Chief Complaint/HPI: Beautiful was referred by Dr. Dennard Nip. During the initial appointment with Dr. Dennard Nip at [facility] on June 29, 2018, Cyana reported experiencing the following:  frequently drinking liquids with calories, frequently making poor food choices, frequently eating larger portions than normal , binge eating behaviors, struggling with emotional eating, having problems with excessive hunger and skipping lunch frequently.   During today's appointment, Enisa was verbally administered a questionnaire  assessing various behaviors related to emotional eating. Laytoya endorsed the following: overeat when you are celebrating, experience food cravings on a regular basis, eat certain foods when you are anxious, stressed, depressed, or your feelings are hurt, use food to help you cope with emotional situations, find food is comforting to you, overeat when you are worried about something, overeat frequently when you are bored or lonely, not worry about what you eat when you are in a good mood, eat to help you stay awake and eat as a reward. She believes the onset of emotional eating was likely around age 13. She could not recall any significant events occurring at that time. Arrin described the frequency of emotional eating as "every day." She shared shecraves sweets, such as ice cream, as well as chips. Hana denied a history of restricting food intake, purging and engagement in other compensatory strategies, and has never been diagnosed with an eating disorder. She also denied a history of treatment for emotional eating. Kalleigh also denied a history of binge eating. Moreover, Detrice indicated boredom and stress triggers emotional eating, whereas being busy makes emotional eating better. Furthermore, Tanijah denied other problems of concern at this time.    Mental Status Examination:  Appearance: neat Behavior: cooperative Mood: euthymic Affect: mood congruent Speech: normal in rate, volume, and tone Eye Contact: appropriate Psychomotor Activity: appropriate Thought Process: linear, logical, and goal directed  Content/Perceptual Disturbances: denies suicidal and homicidal  ideation, plan, and intent and no hallucinations, delusions, bizarre thinking or behavior reported or observed Orientation: time, person, place and purpose of appointment Cognition/Sensorium: memory, attention, language, and fund of knowledge intact  Insight: good Judgment: good  Family & Psychosocial History: Nicholl reported she is not in a relationship and has one son (age 34). 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 Additionally, Anicia shared her highest level of education obtained is a master's degree. Currently, Keyarra's social support system consists of her friends, son, mother, and nieces. She noted her sister is deceased. Moreover, Aurielle stated she resides alone, but her son lives with her when he is not at college.   Medical History:  Past Medical History:  Diagnosis Date   Abnormal Pap smear    Anemia    Pilonidal cyst    Past Surgical History:  Procedure Laterality Date   LEEP     CIN II   Current Outpatient Medications on File Prior to Visit  Medication Sig Dispense Refill   Multiple Vitamin (MULTIVITAMIN) tablet Take 1 tablet by mouth daily.     Vitamin D, Ergocalciferol, (DRISDOL) 1.25 MG (50000 UT) CAPS capsule Take 1 capsule (50,000 Units total) by mouth every 7 (seven) days. (Patient not taking: Reported on 06/29/2018) 12 capsule 0   No current facility-administered medications on file prior to visit.   Veleda denied a history of head injuries and loss of consciousness.   Mental Health History: Niomie denied a history of mental health treatment, including therapeutic services. Laylaa denied a history of hospitalizations for psychiatric concerns, and has never met with a psychiatrist. Amethyst stated she believes she was prescribed something for depression, but she noted she did not take it. She indicated it was prescribed in the beginning of 2019 by her OB/GYN. Asenath  endorsed a family history of mental health related concerns. More specifically, she noted, "I had an [maternal] uncle that was schizophrenic." Liane denied a trauma history, including psychological, physical  and sexual abuse, as well as neglect.   Christon described her typical mood as "happy, outgoing, talkative, friendly." Aside from concerns noted above and endorsed on the PHQ-9 and GAD-7, Amanat reported experiencing decreased motivation and hopelessness. Regarding hopelessness, she explained it is because she is bored due to the pandemic and not due to suicidal thoughts. She further shared that prior to the pandemic she was always busy with work or out with friends. Additionally, she explained she feels something awful may happen due to current events. Tylie endorsed social alcohol use. She denied tobacco use. She denied illicit substance use. Regarding caffeine intake, Shamell reported consuming a cup of coffee daily. Furthermore, Derrica denied experiencing the following: obsessions and compulsions, hallucinations and delusions, paranoia and mania. She also denied history of and current suicidal ideation, plan, and intent; history of and current homicidal ideation, plan, and intent; and history of and current engagement in self-harm.  The following strengths were reported by Thayer Headings: people person, go getter, and motivated. The following strengths were observed by this provider: ability to express thoughts and feelings during the therapeutic session, ability to establish and benefit from a therapeutic relationship, ability to learn and practice coping skills, willingness to work toward established goal(s) with the clinic and ability to engage in reciprocal conversation.  Legal History: Dewey denied a history of legal involvement.   Structured Assessment Results: The Patient Health Questionnaire-9 (PHQ-9) is a self-report measure that assesses symptoms and severity of depression over the course of the  last two weeks. Kaedynce obtained a score of 11 suggesting moderate depression. Raylee finds the endorsed symptoms to be somewhat difficult. Little interest or pleasure in doing things 1  Feeling down, depressed, or hopeless 1  Trouble falling or staying asleep, or sleeping too much 1  Feeling tired or having little energy 3  Poor appetite or overeating 3  Feeling bad about yourself --- or that you are a failure or have let yourself or your family down 0  Trouble concentrating on things, such as reading the newspaper or watching television 1  Moving or speaking so slowly that other people could have noticed? Or the opposite --- being so fidgety or restless that you have been moving around a lot more than usual 1  Thoughts that you would be better off dead or hurting yourself in some way 0  PHQ-9 Score 11    The Generalized Anxiety Disorder-7 (GAD-7) is a brief self-report measure that assesses symptoms of anxiety over the course of the last two weeks. Dandria obtained a score of 7 suggesting mild anxiety. Telia finds the endorsed symptoms to be not difficult at all. Feeling nervous, anxious, on edge 0  Not being able to stop or control worrying 0  Worrying too much about different things 0  Trouble relaxing 3  Being so restless that it's hard to sit still 2  Becoming easily annoyed or irritable 1  Feeling afraid as if something awful might happen 1  GAD-7 Score 7   Interventions: A chart review was conducted prior to the clinical intake interview. The PHQ-9, and GAD-7 were verbally administered as well as a Mood and Food questionnaire to assess various behaviors related to emotional eating. Throughout session, empathic reflections and validation was provided. Continuing treatment with this provider was discussed and a treatment goal was established. Psychoeducation regarding emotional versus physical hunger was provided. Tereka was sent a handout via e-mail to utilize between now and the next  appointment to increase awareness of  hunger patterns and subsequent eating. Avannah provided verbal consent during today's appointment for this provider to send the handout via e-mail.   Provisional DSM-5 Diagnosis: 311 (F32.8) Other Specified Depressive Disorder, Emotional Eating Behaviors  Plan: Keanna appears able and willing to participate as evidenced by collaboration on a treatment goal, engagement in reciprocal conversation, and asking questions as needed for clarification. The next appointment will be scheduled in two weeks, which will be via [employer]. The following treatment goal was established: decrease emotional eating. Once this provider's office resumes in-person appointments and it is deemed appropriate, Nile will be notified. For the aforementioned goal, Beola can benefit from biweekly individual therapy sessions that are brief in duration for approximately four to six sessions. The treatment modality will be individual therapeutic services, including an eclectic therapeutic approach utilizing techniques from Cognitive Behavioral Therapy, Patient Centered Therapy, Dialectical Behavior Therapy, Acceptance and Commitment Therapy, Interpersonal Therapy, and Cognitive Restructuring. Therapeutic approach will include various interventions as appropriate, such as validation, support, mindfulness, thought defusion, reframing, psychoeducation, values assessment, and role playing. This provider will regularly review the treatment plan and medical chart to keep informed of status changes. Anberlyn expressed understanding and agreement with the initial treatment plan of care.

## 2018-07-13 ENCOUNTER — Ambulatory Visit (INDEPENDENT_AMBULATORY_CARE_PROVIDER_SITE_OTHER): Payer: BC Managed Care – PPO | Admitting: Family Medicine

## 2018-07-13 ENCOUNTER — Ambulatory Visit (INDEPENDENT_AMBULATORY_CARE_PROVIDER_SITE_OTHER): Payer: BC Managed Care – PPO | Admitting: Psychology

## 2018-07-13 ENCOUNTER — Encounter (INDEPENDENT_AMBULATORY_CARE_PROVIDER_SITE_OTHER): Payer: Self-pay | Admitting: Family Medicine

## 2018-07-13 ENCOUNTER — Other Ambulatory Visit: Payer: Self-pay

## 2018-07-13 VITALS — BP 117/78 | HR 82 | Temp 98.0°F | Ht 64.0 in | Wt 197.0 lb

## 2018-07-13 DIAGNOSIS — Z9189 Other specified personal risk factors, not elsewhere classified: Secondary | ICD-10-CM

## 2018-07-13 DIAGNOSIS — F3289 Other specified depressive episodes: Secondary | ICD-10-CM | POA: Diagnosis not present

## 2018-07-13 DIAGNOSIS — E669 Obesity, unspecified: Secondary | ICD-10-CM

## 2018-07-13 DIAGNOSIS — R7303 Prediabetes: Secondary | ICD-10-CM | POA: Diagnosis not present

## 2018-07-13 DIAGNOSIS — E559 Vitamin D deficiency, unspecified: Secondary | ICD-10-CM | POA: Diagnosis not present

## 2018-07-13 DIAGNOSIS — Z6833 Body mass index (BMI) 33.0-33.9, adult: Secondary | ICD-10-CM

## 2018-07-13 MED ORDER — VITAMIN D (ERGOCALCIFEROL) 1.25 MG (50000 UNIT) PO CAPS: 50000.0000 [IU] | ORAL_CAPSULE | ORAL | 0 refills | Status: DC

## 2018-07-13 NOTE — Progress Notes (Signed)
Office: [phone]  /  Fax: [phone]   HPI:   Chief Complaint: OBESITY Katie Curtis is here to discuss her progress with her obesity treatment plan. She is on the  follow the Category 2 plan and is following her eating plan approximately 70-80 % of the time. She states she is exercising 0 minutes 0 times per week. Katie Curtis is currently struggled to follow her Category 2 plan after the 1st week after she got bored and started to eat out more. She does not do well with meal planning and prepping.  Her weight is 197 lb (89.4 kg) today and has had a weight gain of 3 pounds over a period of 2 weeks since her last visit. She has lost 0 lbs since starting treatment with us.  Vitamin D deficiency Katie Curtis has a diagnosis of vitamin D deficiency. She is not currently taking vit D and denies nausea, vomiting or muscle weakness. She reports fatigue.   Pre-Diabetes Katie Curtis has a diagnosis of prediabetes based on her elevated HgA1c and was informed this puts her at greater risk of developing diabetes. She is not taking metformin currently and continues to work on diet and exercise to decrease risk of diabetes. She denies nausea or hypoglycemia. She reports polyphagia improved while on her eating plan.   At risk for diabetes Katie Curtis is at higher than averagerisk for developing diabetes due to her obesity. She currently denies polyuria or polydipsia.  ASSESSMENT AND PLAN:  Vitamin D deficiency - Plan: Vitamin D, Ergocalciferol, (DRISDOL) 1.25 MG (50000 UT) CAPS capsule  Prediabetes  At risk for diabetes mellitus  Class 1 obesity with serious comorbidity and body mass index (BMI) of 33.0 to 33.9 in adult, unspecified obesity type  PLAN: Vitamin D Deficiency Katie Curtis was informed that low vitamin D levels contributes to fatigue and are associated with obesity, breast, and colon cancer. She agrees to start to take prescription Vit D @50 ,000 IU every week #4 with no refills and will follow up for routine  testing of vitamin D, at least 2-3 times per year. She was informed of the risk of over-replacement of vitamin D and agrees to not increase her dose unless she discusses this with us first. We will recheck labs in 3 months. Agrees to follow up with our clinic as directed.   Pre-Diabetes Katie Curtis will continue to work on weight loss, exercise, and decreasing simple carbohydrates in her diet to help decrease the risk of diabetes. We dicussed metformin including benefits and risks. She was informed that eating too many simple carbohydrates or too many calories at one sitting increases the likelihood of GI side effects. Katie Curtis declined metformin for now and a prescription was not written today. Katie Curtis agreed to follow up with us as directed to monitor her progress. We will recheck labs in 3 months.   Diabetes risk counselling Katie Curtis was given extended (15 minutes) diabetes prevention counseling today. She is 38 y.o. female and has risk factors for diabetes including obesity. We discussed intensive lifestyle modifications today with an emphasis on weight loss as well as increasing exercise and decreasing simple carbohydrates in her diet.   Obesity Katie Curtis is currently in the action stage of change. As such, her goal is to continue with weight loss efforts She has agreed to keep a food journal with 300-450 calories and 30+g of  Protein at breakfast and follow the Category 1 plan.  Katie Curtis has been instructed to work up to a goal of 150 minutes of combined cardio and  strengthening exercise per week for weight loss and overall health benefits. We discussed the following Behavioral Modification Stratagies today: increasing lean protein intake, decreasing simple carbohydrates  and work on meal planning and easy cooking plans   Katie Curtis has agreed to follow up with our clinic in 2 weeks. She was informed of the importance of frequent follow up visits to maximize her success with intensive lifestyle modifications for  her multiple health conditions.  ALLERGIES: Allergies  Allergen Reactions  . Hydrocodone   . Oxycodone   . Penicillins     MEDICATIONS: Current Outpatient Medications on File Prior to Visit  Medication Sig Dispense Refill  . Multiple Vitamin (MULTIVITAMIN) tablet Take 1 tablet by mouth daily.     No current facility-administered medications on file prior to visit.     PAST MEDICAL HISTORY: Past Medical History:  Diagnosis Date  . Abnormal Pap smear   . Anemia   . Pilonidal cyst     PAST SURGICAL HISTORY: Past Surgical History:  Procedure Laterality Date  . LEEP     CIN II    SOCIAL HISTORY: Social History   Tobacco Use  . Smoking status: Never Smoker  . Smokeless tobacco: Never Used  Substance Use Topics  . Alcohol use: Yes    Alcohol/week: 0.0 - 1.0 standard drinks  . Drug use: No    FAMILY HISTORY: Family History  Problem Relation Age of Onset  . Lung cancer Paternal Grandmother        smoker  . Hypertension Paternal Grandmother   . Hypothyroidism Paternal Grandmother   . Congestive Heart Failure Father        had been on dialysis x 10 years  . Diabetes Father   . High Cholesterol Father   . Heart disease Father   . Kidney disease Father   . Cancer Father   . Obesity Father   . Hypertension Paternal Grandfather   . Kidney failure Paternal Grandfather   . Stroke Maternal Grandmother   . Depression Mother   . Sleep apnea Mother   . Alcoholism Mother     ROS: Review of Systems  Constitutional: Positive for malaise/fatigue. Negative for weight loss.  Gastrointestinal: Negative for nausea and vomiting.  Musculoskeletal:       Negative for muscle weakness  Endo/Heme/Allergies: Negative for polydipsia.       Negative for hypoglycemia  Negative for polyuria    PHYSICAL EXAM: Blood pressure 117/78, pulse 82, temperature 98 F (36.7 C), temperature source Oral, height 5\' 4"  (1.626 m), weight 197 lb (89.4 kg), SpO2 100 %. Body mass index is  33.81 kg/m. Physical Exam Vitals signs reviewed.  Constitutional:      Appearance: Normal appearance. She is obese.  HENT:     Head: Normocephalic.     Nose: Nose normal.  Neck:     Musculoskeletal: Normal range of motion.  Cardiovascular:     Rate and Rhythm: Normal rate.  Pulmonary:     Effort: Pulmonary effort is normal.  Musculoskeletal: Normal range of motion.  Skin:    General: Skin is warm and dry.  Neurological:     Mental Status: She is alert and oriented to person, place, and time.  Psychiatric:        Mood and Affect: Mood normal.        Behavior: Behavior normal.     RECENT LABS AND TESTS: BMET    Component Value Date/Time   NA 139 06/29/2018 1217   K 4.1 06/29/2018  1217   CL 103 06/29/2018 1217   CO2 21 06/29/2018 1217   GLUCOSE 85 06/29/2018 1217   GLUCOSE 96 01/10/2016 1331   BUN 8 06/29/2018 1217   CREATININE 0.68 06/29/2018 1217   CREATININE 0.71 01/10/2016 1331   CALCIUM 9.6 06/29/2018 1217   GFRNONAA 110 06/29/2018 1217   GFRAA 127 06/29/2018 1217   Lab Results  Component Value Date   HGBA1C 5.7 (H) 06/29/2018   Lab Results  Component Value Date   INSULIN 13.1 06/29/2018   CBC    Component Value Date/Time   WBC 8.7 06/29/2018 1217   WBC 10.5 01/10/2016 1331   RBC 4.32 06/29/2018 1217   RBC 4.32 01/10/2016 1331   HGB 12.7 06/29/2018 1217   HGB 11.5 (L) 12/28/2014 0842   HCT 39.4 06/29/2018 1217   PLT 356 03/19/2018 1149   MCV 91 06/29/2018 1217   MCH 29.4 06/29/2018 1217   MCH 28.5 01/10/2016 1331   MCHC 32.2 06/29/2018 1217   MCHC 31.4 (L) 01/10/2016 1331   RDW 13.7 06/29/2018 1217   LYMPHSABS 2.1 06/29/2018 1217   MONOABS 0.7 04/19/2011 1457   EOSABS 0.1 06/29/2018 1217   BASOSABS 0.1 06/29/2018 1217   Iron/TIBC/Ferritin/ %Sat    Component Value Date/Time   FERRITIN 23 03/19/2018 1336   Lipid Panel     Component Value Date/Time   CHOL 169 06/29/2018 1217   TRIG 76 06/29/2018 1217   HDL 47 06/29/2018 1217   CHOLHDL  3.0 03/19/2018 1149   CHOLHDL 2.9 01/10/2016 1331   VLDL 12 01/10/2016 1331   LDLCALC 107 (H) 06/29/2018 1217   Hepatic Function Panel     Component Value Date/Time   PROT 7.3 06/29/2018 1217   ALBUMIN 4.5 06/29/2018 1217   AST 15 06/29/2018 1217   ALT 13 06/29/2018 1217   ALKPHOS 72 06/29/2018 1217   BILITOT 0.3 06/29/2018 1217      Component Value Date/Time   TSH 0.581 06/29/2018 1217   TSH 1.180 03/19/2018 1149   TSH 1.659 12/28/2014 0909     Ref. Range 06/29/2018 12:17  Vitamin D, 25-Hydroxy Latest Ref Range: 30.0 - 100.0 ng/mL 22.3 (L)     OBESITY BEHAVIORAL INTERVENTION VISIT  Today's visit was # 2   Starting weight: 194 lb Starting date: 06/29/18 Today's weight : Weight: 197 lb (89.4 kg)  Today's date: 07/13/2018 Total lbs lost to date: 0 At least 15 minutes were spent on discussing the following behavioral intervention visit.   ASK: We discussed the diagnosis of obesity with [name] today and Katie Curtis agreed to give us permission to discuss obesity behavioral modification therapy today.  ASSESS: Katie Curtis has the diagnosis of obesity and her BMI today is 33.8 Katie Curtis is in the action stage of change   ADVISE: Katie Curtis was educated on the multiple health risks of obesity as well as the benefit of weight loss to improve her health. She was advised of the need for long term treatment and the importance of lifestyle modifications to improve her current health and to decrease her risk of future health problems.  AGREE: Multiple dietary modification options and treatment options were discussed and  Katie Curtis agreed to follow the recommendations documented in the above note.  ARRANGE: Katie Curtis was educated on the importance of frequent visits to treat obesity as outlined per CMS and USPSTF guidelines and agreed to schedule her next follow up appointment today.  Otis PeakI, Ashleigh Haynes, am acting as Energy managertranscriptionist for Quillian Quincearen Rylend Pietrzak, MD  I have reviewed the above  documentation for accuracy and completeness, and I agree with the above. -Quillian Quincearen Averi Cacioppo, MD

## 2018-07-26 ENCOUNTER — Encounter (INDEPENDENT_AMBULATORY_CARE_PROVIDER_SITE_OTHER): Payer: Self-pay | Admitting: Family Medicine

## 2018-07-26 ENCOUNTER — Other Ambulatory Visit: Payer: Self-pay

## 2018-07-26 ENCOUNTER — Ambulatory Visit (INDEPENDENT_AMBULATORY_CARE_PROVIDER_SITE_OTHER): Payer: BC Managed Care – PPO | Admitting: Family Medicine

## 2018-07-26 VITALS — BP 128/91 | HR 94 | Temp 98.5°F | Ht 64.0 in | Wt 198.0 lb

## 2018-07-26 DIAGNOSIS — R7303 Prediabetes: Secondary | ICD-10-CM | POA: Diagnosis not present

## 2018-07-26 DIAGNOSIS — Z9189 Other specified personal risk factors, not elsewhere classified: Secondary | ICD-10-CM

## 2018-07-26 DIAGNOSIS — E559 Vitamin D deficiency, unspecified: Secondary | ICD-10-CM | POA: Diagnosis not present

## 2018-07-26 MED ORDER — VITAMIN D (ERGOCALCIFEROL) 1.25 MG (50000 UNIT) PO CAPS: 50000.0000 [IU] | ORAL_CAPSULE | ORAL | 0 refills | Status: DC

## 2018-07-26 MED ORDER — METFORMIN HCL 500 MG PO TABS: 500.0000 mg | ORAL_TABLET | Freq: Every day | ORAL | 0 refills | Status: DC

## 2018-07-27 NOTE — Progress Notes (Signed)
Office: [phone]  /  Fax: [phone]   HPI:   Chief Complaint: OBESITY Khanh is here to discuss her progress with her obesity treatment plan. She is on the Category 1 plan and is following her eating plan approximately 60-65% of the time. She states she is exercising 0 minutes 0 times per week. Arah continues to struggle with weight loss. Her RMR was unusually low and does not appear to be improving. Cherolyn has not followed her plan closely though, which is likely a contributing factor. Her weight is 198 lb (89.8 kg) today and has had a weight gain of 1 lb since her last visit. She has lost 0 lbs since starting treatment with [place].  Pre-Diabetes Samie has a diagnosis of prediabetes based on her elevated Hgb A1c and was informed this puts her at greater risk of developing diabetes. Her last A1c was 5.7 on 06/29/2018. She is struggling to follow her plan closely. She is not taking metformin currently and continues to work on diet and exercise to decrease risk of diabetes.  At risk for diabetes Illona is at higher than average risk for developing diabetes due to her obesity. She currently denies polyuria or polydipsia.  Vitamin D deficiency Harolyn has a diagnosis of Vitamin D deficiency. She is currently stable on prescription Vit D and denies nausea, vomiting or muscle weakness.  ASSESSMENT AND PLAN:  Prediabetes - Plan: metFORMIN (GLUCOPHAGE) 500 MG tablet  Vitamin D deficiency - Plan: Vitamin D, Ergocalciferol, (DRISDOL) 1.25 MG (50000 UT) CAPS capsule  At risk for diabetes mellitus  PLAN:  Pre-Diabetes Brendan will continue to work on weight loss, exercise, and decreasing simple carbohydrates in her diet to help decrease the risk of diabetes. We dicussed metformin including benefits and risks. She was informed that eating too many simple carbohydrates or too many calories at one sitting increases the likelihood of GI side effects. Lavell will start metformin 500 mg QAM #30  with 0 refills and agrees to follow-up with our clinic in 2 weeks.  Diabetes risk counseling Lolly was given extended (15 minutes) diabetes prevention counseling today. She is 38 y.o. female and has risk factors for diabetes including obesity. We discussed intensive lifestyle modifications today with an emphasis on weight loss as well as increasing exercise and decreasing simple carbohydrates in her diet.  Vitamin D Deficiency Ascencion was informed that low Vitamin D levels contributes to fatigue and are associated with obesity, breast, and colon cancer. She agrees to continue to take prescription Vit D @ 50,000 IU every week #4 with 0 refills and will follow-up for routine testing of Vitamin D, at least 2-3 times per year. She was informed of the risk of over-replacement of Vitamin D and agrees to not increase her dose unless she discusses this with [place] first. Laelani agrees to follow-up with our clinic in 2 weeks.  Obesity Shatana is currently in the action stage of change. As such, her goal is to continue with weight loss efforts. She was changed to a lower carbohydrate, vegetable and lean protein rich diet plan. Sanda has been instructed to work up to a goal of 150 minutes of combined cardio and strengthening exercise per week for weight loss and overall health benefits. We discussed the following Behavioral Modification Strategies today: increasing lean protein intake, work on meal planning and easy cooking plans, and keeping healthy foods in the home.  Montoya has agreed to follow-up with our clinic in 2 weeks. She was informed of the importance of  frequent follow-up visits to maximize her success with intensive lifestyle modifications for her multiple health conditions.  ALLERGIES: Allergies  Allergen Reactions   Hydrocodone    Oxycodone    Penicillins     MEDICATIONS: Current Outpatient Medications on File Prior to Visit  Medication Sig Dispense Refill   Multiple Vitamin  (MULTIVITAMIN) tablet Take 1 tablet by mouth daily.     No current facility-administered medications on file prior to visit.     PAST MEDICAL HISTORY: Past Medical History:  Diagnosis Date   Abnormal Pap smear    Anemia    Pilonidal cyst     PAST SURGICAL HISTORY: Past Surgical History:  Procedure Laterality Date   LEEP     CIN II    SOCIAL HISTORY: Social History   Tobacco Use   Smoking status: Never Smoker   Smokeless tobacco: Never Used  Substance Use Topics   Alcohol use: Yes    Alcohol/week: 0.0 - 1.0 standard drinks   Drug use: No    FAMILY HISTORY: Family History  Problem Relation Age of Onset   Lung cancer Paternal Grandmother        smoker   Hypertension Paternal Grandmother    Hypothyroidism Paternal Grandmother    Congestive Heart Failure Father        had been on dialysis x 10 years   Diabetes Father    High Cholesterol Father    Heart disease Father    Kidney disease Father    Cancer Father    Obesity Father    Hypertension Paternal Grandfather    Kidney failure Paternal Grandfather    Stroke Maternal Grandmother    Depression Mother    Sleep apnea Mother    Alcoholism Mother    ROS: Review of Systems  Gastrointestinal: Negative for nausea and vomiting.  Musculoskeletal:       Negative for muscle weakness.   PHYSICAL EXAM: Blood pressure (!) 128/91, pulse 94, temperature 98.5 F (36.9 C), temperature source Oral, height 5\' 4"  (1.626 m), weight 198 lb (89.8 kg), last menstrual period 07/24/2018, SpO2 99 %. Body mass index is 33.99 kg/m. Physical Exam Vitals signs reviewed.  Constitutional:      Appearance: Normal appearance. She is obese.  Cardiovascular:     Rate and Rhythm: Normal rate.     Pulses: Normal pulses.  Pulmonary:     Effort: Pulmonary effort is normal.     Breath sounds: Normal breath sounds.  Musculoskeletal: Normal range of motion.  Skin:    General: Skin is warm and dry.  Neurological:       Mental Status: She is alert and oriented to person, place, and time.  Psychiatric:        Behavior: Behavior normal.   RECENT LABS AND TESTS: BMET    Component Value Date/Time   NA 139 06/29/2018 1217   K 4.1 06/29/2018 1217   CL 103 06/29/2018 1217   CO2 21 06/29/2018 1217   GLUCOSE 85 06/29/2018 1217   GLUCOSE 96 01/10/2016 1331   BUN 8 06/29/2018 1217   CREATININE 0.68 06/29/2018 1217   CREATININE 0.71 01/10/2016 1331   CALCIUM 9.6 06/29/2018 1217   GFRNONAA 110 06/29/2018 1217   GFRAA 127 06/29/2018 1217   Lab Results  Component Value Date   HGBA1C 5.7 (H) 06/29/2018   Lab Results  Component Value Date   INSULIN 13.1 06/29/2018   CBC    Component Value Date/Time   WBC 8.7 06/29/2018 1217  WBC 10.5 01/10/2016 1331   RBC 4.32 06/29/2018 1217   RBC 4.32 01/10/2016 1331   HGB 12.7 06/29/2018 1217   HGB 11.5 (L) 12/28/2014 0842   HCT 39.4 06/29/2018 1217   PLT 356 03/19/2018 1149   MCV 91 06/29/2018 1217   MCH 29.4 06/29/2018 1217   MCH 28.5 01/10/2016 1331   MCHC 32.2 06/29/2018 1217   MCHC 31.4 (L) 01/10/2016 1331   RDW 13.7 06/29/2018 1217   LYMPHSABS 2.1 06/29/2018 1217   MONOABS 0.7 04/19/2011 1457   EOSABS 0.1 06/29/2018 1217   BASOSABS 0.1 06/29/2018 1217   Iron/TIBC/Ferritin/ %Sat    Component Value Date/Time   FERRITIN 23 03/19/2018 1336   Lipid Panel     Component Value Date/Time   CHOL 169 06/29/2018 1217   TRIG 76 06/29/2018 1217   HDL 47 06/29/2018 1217   CHOLHDL 3.0 03/19/2018 1149   CHOLHDL 2.9 01/10/2016 1331   VLDL 12 01/10/2016 1331   LDLCALC 107 (H) 06/29/2018 1217   Hepatic Function Panel     Component Value Date/Time   PROT 7.3 06/29/2018 1217   ALBUMIN 4.5 06/29/2018 1217   AST 15 06/29/2018 1217   ALT 13 06/29/2018 1217   ALKPHOS 72 06/29/2018 1217   BILITOT 0.3 06/29/2018 1217      Component Value Date/Time   TSH 0.581 06/29/2018 1217   TSH 1.180 03/19/2018 1149   TSH 1.659 12/28/2014 0909   Results for  Harrold DonathSTOKES, Hana C (MRN [id number]) as of 07/27/2018 15:03  Ref. Range 06/29/2018 12:17  Vitamin D, 25-Hydroxy Latest Ref Range: 30.0 - 100.0 ng/mL 22.3 (L)   OBESITY BEHAVIORAL INTERVENTION VISIT  Today's visit was #3   Starting weight: 194 lbs Starting date: 06/29/2018 Today's weight: 198 lbs  Today's date: 07/26/2018 Total lbs lost to date: 0   07/26/2018  Height 5\' 4"  (1.626 m)  Weight 198 lb (89.8 kg)  BMI (Calculated) 33.97  BLOOD PRESSURE - SYSTOLIC 128  BLOOD PRESSURE - DIASTOLIC 91   Body Fat % 38.3 %  Total Body Water (lbs) 74.6 lbs   ASK: We discussed the diagnosis of obesity with Harrold DonathJanice C Lebarron today and Liborio NixonJanice agreed to give us permission to discuss obesity behavioral modification therapy today.  ASSESS: Liborio NixonJanice has the diagnosis of obesity and her BMI today is 34.0. Liborio NixonJanice is in the action stage of change.   ADVISE: Liborio NixonJanice was educated on the multiple health risks of obesity as well as the benefit of weight loss to improve her health. She was advised of the need for long term treatment and the importance of lifestyle modifications to improve her current health and to decrease her risk of future health problems.  AGREE: Multiple dietary modification options and treatment options were discussed and  Liborio NixonJanice agreed to follow the recommendations documented in the above note.  ARRANGE: Liborio NixonJanice was educated on the importance of frequent visits to treat obesity as outlined per CMS and USPSTF guidelines and agreed to schedule her next follow up appointment today.  I, Marianna Paymentenise Haag, am acting as Energy managertranscriptionist for Quillian Quincearen Dereonna Lensing, MD I have reviewed the above documentation for accuracy and completeness, and I agree with the above. -Quillian Quincearen Cierra Rothgeb, MD

## 2018-07-29 ENCOUNTER — Ambulatory Visit (INDEPENDENT_AMBULATORY_CARE_PROVIDER_SITE_OTHER): Payer: BC Managed Care – PPO | Admitting: Psychology

## 2018-07-29 ENCOUNTER — Other Ambulatory Visit: Payer: Self-pay

## 2018-07-29 DIAGNOSIS — F3289 Other specified depressive episodes: Secondary | ICD-10-CM | POA: Diagnosis not present

## 2018-07-29 NOTE — Progress Notes (Signed)
Office: [phone]  /  Fax: [phone]    Date: July 29, 2018    Appointment Start Time: 2:01pm Duration: 26 minutes Provider: Lawerance CruelGaytri Dajahnae Vondra, Psy.D. Type of Session: Individual Therapy  Location of Patient: Home Location of Provider: Provider's Home Type of Contact: Telepsychological Visit via [employer]   Session Content: Katie Curtis is a 38 y.o. female presenting via [employer] for a follow-up appointment to address the previously established treatment goal of decreasing emotional eating. Today's appointment was a telepsychological visit, as this provider's clinic is seeing a limited number of patients for in-person visits due to COVID-19. Therapeutic services will resume to in-person appointments once deemed appropriate. Katie Curtis expressed understanding regarding the rationale for telepsychological services, and provided verbal consent for today's appointment. Prior to proceeding with today's appointment, Khianna's physical location at the time of this appointment was obtained. Katie Curtis reported she was at home and provided the address. In the event of technical difficulties, Katie Curtis shared a phone number she could be reached at. Katie Curtis and this provider participated in today's telepsychological service. Also, Katie Curtis denied anyone else being present in the room or on the WebEx appointment.  This provider conducted a brief check-in and verbally administered the PHQ-9 and GAD-7. Katie Curtis reported having difficulty following the initial structured meal plan; therefore, she was switched to the low carbohydrate plan. She observed that she often skips meals and eats sporadically. Katie Curtis added, "When I do eat, I want it all." In addition, she discussed decreased motivation, but believes if she sees results, it will improve her motivation. Due to her sporadic eating habits, psychoeducation regarding the hunger and satisfaction scale was provided. She was encouraged to use the scale between now and next  appointment to assist with the frequency of eating to avoid going long periods without eating. Katie Curtis agreed. Additionally, psychoeducation regarding triggers for emotional eating was provided. Katie Curtis was provided a handout, and encouraged to utilize the handout between now and the next appointment to increase awareness of triggers and frequency. Katie Curtis agreed. This provider also discussed behavioral strategies for specific triggers, such as placing the utensil down when conversing to avoid mindless eating. Katie Curtis provided verbal consent during today's appointment for this provider to send handouts for the hunger and satisfaction scale and triggers via e-mail. Katie Curtis was receptive to today's session as evidenced by openness to sharing, responsiveness to feedback, and willingness to explore triggers for emotional eating.  Mental Status Examination:  Appearance: neat Behavior: cooperative Mood: euthymic Affect: mood congruent Speech: normal in rate, volume, and tone Eye Contact: appropriate Psychomotor Activity: appropriate Thought Process: linear, logical, and goal directed  Content/Perceptual Disturbances: denies suicidal and homicidal ideation, plan, and intent and no hallucinations, delusions, bizarre thinking or behavior reported or observed Orientation: time, person, place and purpose of appointment Cognition/Sensorium: memory, attention, language, and fund of knowledge intact  Insight: good Judgment: good  Structured Assessment Results: The Patient Health Questionnaire-9 (PHQ-9) is a self-report measure that assesses symptoms and severity of depression over the course of the last two weeks. Katie Curtis obtained a score of 1 suggesting minimal depression. Katie Curtis finds the endorsed symptoms to be somewhat difficult. Little interest or pleasure in doing things 0  Feeling down, depressed, or hopeless 0  Trouble falling or staying asleep, or sleeping too much 0  Feeling tired or having little energy  1  Poor appetite or overeating 0  Feeling bad about yourself --- or that you are a failure or have let yourself or your family down 0  Trouble  concentrating on things, such as reading the newspaper or watching television 0  Moving or speaking so slowly that other people could have noticed? Or the opposite --- being so fidgety or restless that you have been moving around a lot more than usual 0  Thoughts that you would be better off dead or hurting yourself in some way 0  PHQ-9 Score 1    The Generalized Anxiety Disorder-7 (GAD-7) is a brief self-report measure that assesses symptoms of anxiety over the course of the last two weeks. Tanielle obtained a score of 0. Feeling nervous, anxious, on edge 0  Not being able to stop or control worrying 0  Worrying too much about different things 0  Trouble relaxing 0  Being so restless that it's hard to sit still 0  Becoming easily annoyed or irritable 0  Feeling afraid as if something awful might happen 0  GAD-7 Score 0   Interventions:  Conducted a brief chart review Verbal administration of PHQ-9 and GAD-7 for symptom monitoring Provided empathic reflections and validation Reviewed content from the previous session Psychoeducation provided regarding triggers for emotional eating Focused on rapport building Psychoeducation provided regarding the hunger and satisfaction scale Employed supportive psychotherapy interventions to facilitate reduced distress, and to improve coping skills with identified stressors  DSM-5 Diagnosis: 311 (F32.8) Other Specified Depressive Disorder, Emotional Eating Behaviors  Treatment Goal & Progress: During the initial appointment with this provider, the following treatment goal was established: decrease emotional eating. Progress is limited, as Shelia has just begun treatment with this provider; however, she is receptive to the interaction and interventions and rapport is being established.   Plan: Latrenda continues to  appear able and willing to participate as evidenced by engagement in reciprocal conversation, and asking questions for clarification as appropriate. The next appointment will be scheduled in two weeks, which will be via [employer]. Once this provider's office resumes in-person appointments and it is deemed appropriate, Leilanie will be notified. The next session will focus on reviewing triggers as well as the hunger and satisfaction scale. Additionally, pleasurable activities will be introduced to assist with coping.

## 2018-08-04 NOTE — Progress Notes (Unsigned)
Office: [phone]  /  Fax: [phone]    Date: August 10, 2018   Appointment Start Time:*** Duration:*** Provider: Lawerance CruelGaytri Justyne Roell, Psy.D. Type of Session: Individual Therapy  Location of Patient: *** Location of Provider: [facility] Type of Contact: Telepsychological Visit via [employer]   Session Content: Katie Curtis is a 38 y.o. female presenting via [employer] for a follow-up appointment to address the previously established treatment goal of decreasing emotional eating. Today's appointment was a telepsychological visit, as this provider's clinic is seeing a limited number of patients for in-person visits due to COVID-19. Therapeutic services will resume to in-person appointments once deemed appropriate. Katie Curtis expressed understanding regarding the rationale for telepsychological services, and provided verbal consent for today's appointment. Prior to proceeding with today's appointment, Myosha's physical location at the time of this appointment was obtained. Katie Curtis reported she was at *** and provided the address. In the event of technical difficulties, Katie Curtis shared a phone number she could be reached at. Katie Curtis and this provider participated in today's telepsychological service. Also, Katie Curtis denied anyone else being present in the room or on the WebEx appointment ***.  This provider conducted a brief check-in and verbally administered the PHQ-9 and GAD-7. ***   Katie Curtis was receptive to today's session as evidenced by openness to sharing, responsiveness to feedback, and ***.  Mental Status Examination:  Appearance: {Appearance:22431} Behavior: {Behavior:22445} Mood: {Teletherapy mood:22435} Affect: {Affect:22436} Speech: {Speech:22432} Eye Contact: {Eye Contact:22433} Psychomotor Activity: {Motor Activity:22434} Thought Process: {thought process:22448}  Content/Perceptual Disturbances: {disturbances:22451} Orientation: {Orientation:22437} Cognition/Sensorium:  {gbcognition:22449} Insight: {Insight:22446} Judgment: {Insight:22446}  Structured Assessment Results: The Patient Health Questionnaire-9 (PHQ-9) is a self-report measure that assesses symptoms and severity of depression over the course of the last two weeks. Katie Curtis obtained a score of *** suggesting {GBPHQ9SEVERITY:21752}. Katie Curtis finds the endorsed symptoms to be {gbphq9difficulty:21754}. Little interest or pleasure in doing things ***  Feeling down, depressed, or hopeless ***  Trouble falling or staying asleep, or sleeping too much ***  Feeling tired or having little energy ***  Poor appetite or overeating ***  Feeling bad about yourself --- or that you are a failure or have let yourself or your family down ***  Trouble concentrating on things, such as reading the newspaper or watching television ***  Moving or speaking so slowly that other people could have noticed? Or the opposite --- being so fidgety or restless that you have been moving around a lot more than usual ***  Thoughts that you would be better off dead or hurting yourself in some way ***  PHQ-9 Score ***    The Generalized Anxiety Disorder-7 (GAD-7) is a brief self-report measure that assesses symptoms of anxiety over the course of the last two weeks. Katie Curtis obtained a score of *** suggesting {gbgad7severity:21753}. Katie Curtis finds the endorsed symptoms to be {gbphq9difficulty:21754}. Feeling nervous, anxious, on edge ***  Not being able to stop or control worrying ***  Worrying too much about different things ***  Trouble relaxing ***  Being so restless that it's hard to sit still ***  Becoming easily annoyed or irritable ***  Feeling afraid as if something awful might happen ***  GAD-7 Score ***   Interventions:  {Interventions:22172}  DSM-5 Diagnosis: 311 (F32.8) Other Specified Depressive Disorder, Emotional Eating Behaviors  Treatment Goal & Progress: During the initial appointment with this provider, the following  treatment goal was established: decrease emotional eating. Katie Curtis has demonstrated progress in her goal as evidenced by ***  Plan: Katie Curtis continues to  appear able and willing to participate as evidenced by engagement in reciprocal conversation, and asking questions for clarification as appropriate. The next appointment will be scheduled in {gbweeks:21758}, which will be via [employer]. Once this provider's office resumes in-person appointments and it is deemed appropriate, Malini will be notified. The next session will focus on reviewing learned skills, and working towards the established treatment goal.***

## 2018-08-09 ENCOUNTER — Encounter (INDEPENDENT_AMBULATORY_CARE_PROVIDER_SITE_OTHER): Payer: Self-pay | Admitting: Family Medicine

## 2018-08-09 ENCOUNTER — Other Ambulatory Visit: Payer: Self-pay

## 2018-08-09 ENCOUNTER — Ambulatory Visit (INDEPENDENT_AMBULATORY_CARE_PROVIDER_SITE_OTHER): Payer: BC Managed Care – PPO | Admitting: Family Medicine

## 2018-08-09 VITALS — BP 101/70 | HR 91 | Temp 98.2°F | Ht 64.0 in | Wt 195.0 lb

## 2018-08-09 DIAGNOSIS — R7303 Prediabetes: Secondary | ICD-10-CM | POA: Diagnosis not present

## 2018-08-09 DIAGNOSIS — E669 Obesity, unspecified: Secondary | ICD-10-CM

## 2018-08-09 DIAGNOSIS — Z6833 Body mass index (BMI) 33.0-33.9, adult: Secondary | ICD-10-CM

## 2018-08-10 ENCOUNTER — Telehealth (INDEPENDENT_AMBULATORY_CARE_PROVIDER_SITE_OTHER): Payer: Self-pay | Admitting: Psychology

## 2018-08-10 ENCOUNTER — Ambulatory Visit (INDEPENDENT_AMBULATORY_CARE_PROVIDER_SITE_OTHER): Payer: BC Managed Care – PPO | Admitting: Psychology

## 2018-08-10 NOTE — Progress Notes (Signed)
Office: [phone]  /  Fax: [phone]   HPI:   Chief Complaint: OBESITY Katie Curtis is here to discuss her progress with her obesity treatment plan. She is on the lower carbohydrate, vegetable and lean protein rich diet plan and is following her eating plan approximately 50 to 55 % of the time. She states she is walking 2 1/2 miles 40 minutes 4 times per week. Katie Curtis tried the low carb plan and she was able to lose weight, even though she wasn't able to follow as closely as ideal. She has increased walking and she feels well overall. Katie Curtis had a decrease in hunger on the lower carb plan. Her weight is 195 lb (88.5 kg) today and has had a weight loss of 3 pounds over a period of 2 weeks since her last visit. She has gained 1 lb since starting treatment with [place].  Pre-Diabetes Katie Curtis has a diagnosis of prediabetes based on her elevated Hgb A1c and was informed this puts her at greater risk of developing diabetes. She was prescribed metformin at the last visit, but she has not yet started it, and she has questions about how it works, side effects, etc. Katie Curtis continues to work on diet and exercise to decrease risk of diabetes. She denies nausea or hypoglycemia.  ASSESSMENT AND PLAN:  Prediabetes  Class 1 obesity with serious comorbidity and body mass index (BMI) of 33.0 to 33.9 in adult, unspecified obesity type  PLAN:  Pre-Diabetes Katie Curtis was educated on prediabetes and the importance of diet and exercise to prevent diabetes. She will continue to work on weight loss, exercise, and decreasing simple carbohydrates in her diet to help decrease the risk of diabetes. She was informed that eating too many simple carbohydrates or too many calories at one sitting increases the likelihood of GI side effects. Katie Curtis agrees to start metformin in the morning and follow up with [place] as directed to monitor her progress.   I spent > than 50% of the 25 minute visit on counseling as documented in the note.   Obesity Katie Curtis is currently in the action stage of change. As such, her goal is to continue with weight loss efforts She has agreed to follow a lower carbohydrate, vegetable and lean protein rich diet plan Katie Curtis has been instructed to work up to a goal of 150 minutes of combined cardio and strengthening exercise per week for weight loss and overall health benefits. We discussed the following Behavioral Modification Strategies today: increase H2O intake, increasing lean protein intake, decreasing simple carbohydrates  and increasing vegetables  Katie Curtis has agreed to follow up with our clinic in 2 to 3 weeks. She was informed of the importance of frequent follow up visits to maximize her success with intensive lifestyle modifications for her multiple health conditions.  ALLERGIES: Allergies  Allergen Reactions  . Hydrocodone   . Oxycodone   . Penicillins     MEDICATIONS: Current Outpatient Medications on File Prior to Visit  Medication Sig Dispense Refill  . metFORMIN (GLUCOPHAGE) 500 MG tablet Take 1 tablet (500 mg total) by mouth daily with breakfast. 30 tablet 0  . Multiple Vitamin (MULTIVITAMIN) tablet Take 1 tablet by mouth daily.    . Vitamin D, Ergocalciferol, (DRISDOL) 1.25 MG (50000 UT) CAPS capsule Take 1 capsule (50,000 Units total) by mouth every 7 (seven) days. 4 capsule 0   No current facility-administered medications on file prior to visit.     PAST MEDICAL HISTORY: Past Medical History:  Diagnosis Date  .  Abnormal Pap smear   . Anemia   . Pilonidal cyst     PAST SURGICAL HISTORY: Past Surgical History:  Procedure Laterality Date  . LEEP     CIN II    SOCIAL HISTORY: Social History   Tobacco Use  . Smoking status: Never Smoker  . Smokeless tobacco: Never Used  Substance Use Topics  . Alcohol use: Yes    Alcohol/week: 0.0 - 1.0 standard drinks  . Drug use: No    FAMILY HISTORY: Family History  Problem Relation Age of Onset  . Lung cancer Paternal  Grandmother        smoker  . Hypertension Paternal Grandmother   . Hypothyroidism Paternal Grandmother   . Congestive Heart Failure Father        had been on dialysis x 10 years  . Diabetes Father   . High Cholesterol Father   . Heart disease Father   . Kidney disease Father   . Cancer Father   . Obesity Father   . Hypertension Paternal Grandfather   . Kidney failure Paternal Grandfather   . Stroke Maternal Grandmother   . Depression Mother   . Sleep apnea Mother   . Alcoholism Mother     ROS: Review of Systems  Constitutional: Positive for weight loss.  Gastrointestinal: Negative for nausea.  Endo/Heme/Allergies:       Negative for hypoglycemia    PHYSICAL EXAM: Blood pressure 101/70, pulse 91, temperature 98.2 F (36.8 C), temperature source Oral, height 5\' 4"  (1.626 m), weight 195 lb (88.5 kg), last menstrual period 07/24/2018, SpO2 98 %. Body mass index is 33.47 kg/m. Physical Exam Vitals signs reviewed.  Constitutional:      Appearance: Normal appearance. She is well-developed. She is obese.  Cardiovascular:     Rate and Rhythm: Normal rate.  Pulmonary:     Effort: Pulmonary effort is normal.  Musculoskeletal: Normal range of motion.  Skin:    General: Skin is warm and dry.  Neurological:     Mental Status: She is alert and oriented to person, place, and time.  Psychiatric:        Mood and Affect: Mood normal.        Behavior: Behavior normal.     RECENT LABS AND TESTS: BMET    Component Value Date/Time   NA 139 06/29/2018 1217   K 4.1 06/29/2018 1217   CL 103 06/29/2018 1217   CO2 21 06/29/2018 1217   GLUCOSE 85 06/29/2018 1217   GLUCOSE 96 01/10/2016 1331   BUN 8 06/29/2018 1217   CREATININE 0.68 06/29/2018 1217   CREATININE 0.71 01/10/2016 1331   CALCIUM 9.6 06/29/2018 1217   GFRNONAA 110 06/29/2018 1217   GFRAA 127 06/29/2018 1217   Lab Results  Component Value Date   HGBA1C 5.7 (H) 06/29/2018   Lab Results  Component Value Date    INSULIN 13.1 06/29/2018   CBC    Component Value Date/Time   WBC 8.7 06/29/2018 1217   WBC 10.5 01/10/2016 1331   RBC 4.32 06/29/2018 1217   RBC 4.32 01/10/2016 1331   HGB 12.7 06/29/2018 1217   HGB 11.5 (L) 12/28/2014 0842   HCT 39.4 06/29/2018 1217   PLT 356 03/19/2018 1149   MCV 91 06/29/2018 1217   MCH 29.4 06/29/2018 1217   MCH 28.5 01/10/2016 1331   MCHC 32.2 06/29/2018 1217   MCHC 31.4 (L) 01/10/2016 1331   RDW 13.7 06/29/2018 1217   LYMPHSABS 2.1 06/29/2018 1217   MONOABS  0.7 04/19/2011 1457   EOSABS 0.1 06/29/2018 1217   BASOSABS 0.1 06/29/2018 1217   Iron/TIBC/Ferritin/ %Sat    Component Value Date/Time   FERRITIN 23 03/19/2018 1336   Lipid Panel     Component Value Date/Time   CHOL 169 06/29/2018 1217   TRIG 76 06/29/2018 1217   HDL 47 06/29/2018 1217   CHOLHDL 3.0 03/19/2018 1149   CHOLHDL 2.9 01/10/2016 1331   VLDL 12 01/10/2016 1331   LDLCALC 107 (H) 06/29/2018 1217   Hepatic Function Panel     Component Value Date/Time   PROT 7.3 06/29/2018 1217   ALBUMIN 4.5 06/29/2018 1217   AST 15 06/29/2018 1217   ALT 13 06/29/2018 1217   ALKPHOS 72 06/29/2018 1217   BILITOT 0.3 06/29/2018 1217      Component Value Date/Time   TSH 0.581 06/29/2018 1217   TSH 1.180 03/19/2018 1149   TSH 1.659 12/28/2014 0909     Ref. Range 06/29/2018 12:17  Vitamin D, 25-Hydroxy Latest Ref Range: 30.0 - 100.0 ng/mL 22.3 (L)    OBESITY BEHAVIORAL INTERVENTION VISIT  Today's visit was # 4   Starting weight: 194 lbs Starting date: 06/29/2018 Today's weight : 195 lbs Today's date: 08/09/2018 Total lbs lost to date: 0    08/09/2018  Height 5\' 4"  (1.626 m)  Weight 195 lb (88.5 kg)  BMI (Calculated) 33.46  BLOOD PRESSURE - SYSTOLIC 101  BLOOD PRESSURE - DIASTOLIC 70   Body Fat % 37.7 %  Total Body Water (lbs) 76.2 lbs    ASK: We discussed the diagnosis of obesity with Katie Curtis today and Katie NixonJanice agreed to give us permission to discuss obesity behavioral  modification therapy today.  ASSESS: Katie NixonJanice has the diagnosis of obesity and her BMI today is 33.46 Katie NixonJanice is in the action stage of change   ADVISE: Katie NixonJanice was educated on the multiple health risks of obesity as well as the benefit of weight loss to improve her health. She was advised of the need for long term treatment and the importance of lifestyle modifications to improve her current health and to decrease her risk of future health problems.  AGREE: Multiple dietary modification options and treatment options were discussed and  Katie NixonJanice agreed to follow the recommendations documented in the above note.  ARRANGE: Katie NixonJanice was educated on the importance of frequent visits to treat obesity as outlined per CMS and USPSTF guidelines and agreed to schedule her next follow up appointment today.  I, Katie Curtis, am acting as transcriptionist for Katie Quincearen Rowyn Mustapha, MD I have reviewed the above documentation for accuracy and completeness, and I agree with the above. -Katie Quincearen Ethlyn Alto, MD

## 2018-08-10 NOTE — Telephone Encounter (Signed)
  Office: [phone]  /  Fax: [phone]  Date of Call: August 10, 2018  Time of Call: 4:32pm Provider: Glennie Isle, PsyD  CONTENT: This provider called Katie Curtis to check-in as she did not present for today's Webex appointment at 4:30pm. A voicemail could not be left as [name]'s voicemail box was full. Of note, this provider stayed on the [facility] appointment for 7 minutes prior to signing off, which is 2 additional minutes past the clinic's 5 minute grace period policy.    PLAN: This provider will wait for [name] to call back. If deemed necessary, this provider or the provider's clinic will call [name] again in approximately one week.

## 2018-08-19 ENCOUNTER — Encounter (INDEPENDENT_AMBULATORY_CARE_PROVIDER_SITE_OTHER): Payer: Self-pay

## 2018-08-30 ENCOUNTER — Encounter (INDEPENDENT_AMBULATORY_CARE_PROVIDER_SITE_OTHER): Payer: Self-pay

## 2018-08-30 ENCOUNTER — Ambulatory Visit (INDEPENDENT_AMBULATORY_CARE_PROVIDER_SITE_OTHER): Payer: BC Managed Care – PPO | Admitting: Family Medicine

## 2018-09-08 ENCOUNTER — Telehealth (INDEPENDENT_AMBULATORY_CARE_PROVIDER_SITE_OTHER): Payer: Self-pay | Admitting: Psychology

## 2018-09-08 NOTE — Telephone Encounter (Signed)
  Office: [phone]  /  Fax: [phone]  Date of Call: September 08, 2018  Time of Call: 8:34am Provider: Glennie Isle, PsyD  CONTENT: This provider called Thayer Headings to check-in and schedule a follow-up appointment. A voicemail could not be left as Arneshia's mailbox was full.   PLAN: This provider will wait for September to call back. No further follow-up planned by this provider.

## 2018-10-05 ENCOUNTER — Other Ambulatory Visit: Payer: Self-pay | Admitting: *Deleted

## 2018-10-05 DIAGNOSIS — Z20822 Contact with and (suspected) exposure to covid-19: Secondary | ICD-10-CM

## 2018-10-06 LAB — NOVEL CORONAVIRUS, NAA: SARS-CoV-2, NAA: NOT DETECTED

## 2018-11-25 ENCOUNTER — Other Ambulatory Visit: Payer: Self-pay

## 2018-11-25 DIAGNOSIS — Z20822 Contact with and (suspected) exposure to covid-19: Secondary | ICD-10-CM

## 2018-11-27 LAB — NOVEL CORONAVIRUS, NAA: SARS-CoV-2, NAA: NOT DETECTED

## 2019-01-23 ENCOUNTER — Other Ambulatory Visit: Payer: Self-pay

## 2019-01-23 ENCOUNTER — Ambulatory Visit
Admission: EM | Admit: 2019-01-23 | Discharge: 2019-01-23 | Disposition: A | Discharge status: Home / Self Care | Payer: BC Managed Care – PPO | Attending: Emergency Medicine | Admitting: Emergency Medicine

## 2019-01-23 DIAGNOSIS — R350 Frequency of micturition: Secondary | ICD-10-CM | POA: Insufficient documentation

## 2019-01-23 DIAGNOSIS — Z113 Encounter for screening for infections with a predominantly sexual mode of transmission: Secondary | ICD-10-CM | POA: Diagnosis present

## 2019-01-23 DIAGNOSIS — R3915 Urgency of urination: Secondary | ICD-10-CM | POA: Diagnosis present

## 2019-01-23 DIAGNOSIS — N898 Other specified noninflammatory disorders of vagina: Secondary | ICD-10-CM | POA: Diagnosis present

## 2019-01-23 LAB — POCT URINALYSIS DIP (MANUAL ENTRY)
Bilirubin, UA: NEGATIVE
Glucose, UA: NEGATIVE mg/dL
Ketones, POC UA: NEGATIVE mg/dL
Leukocytes, UA: NEGATIVE
Nitrite, UA: NEGATIVE
Protein Ur, POC: NEGATIVE mg/dL
Spec Grav, UA: >=1.03 — AB (ref 1.010–1.025)
Urobilinogen, UA: 0.2 E.U./dL
pH, UA: 5.5 (ref 5.0–8.0)

## 2019-01-23 MED ORDER — FLUCONAZOLE 150 MG PO TABS: 150.0000 mg | ORAL_TABLET | Freq: Every day | ORAL | 0 refills | Status: DC

## 2019-01-23 NOTE — Discharge Instructions (Addendum)
Point-of-care urine analysis showed trace of blood STD screening was completed Urine culture sent.  We will call you with the results.   Push fluids and get plenty of rest.   Follow up with PCP if symptoms persists Return here or go to ER if you have any new or worsening symptoms such as fever, worsening abdominal pain, nausea/vomiting, flank pain

## 2019-01-23 NOTE — ED Provider Notes (Signed)
[facility]    CSN: [id number] Arrival date & time: 01/23/19  0809      History   Chief Complaint Chief Complaint  Patient presents with  . UTI sx    HPI Katie Curtis is a 39 y.o. female.   Patient also complains of urine frequency, urgency, back pain,  white vaginal discharge and malodorous for the past 4 days.  Report discharge is thick white.  She denies a precipitating event or recent sexual encounter.  She has tried OTC medication without relief.  Her symptoms are made worse with urination.  She reports similar symptoms in the past that improved with antibiotic treatment.  She complains of decreased amount, increased urgency.  She denies fever, chills, nausea, vomiting, abdominal pain, flank pain, hematuria, or incontinence  The history is provided by the patient. No language interpreter was used.    Past Medical History:  Diagnosis Date  . Abnormal Pap smear   . Anemia   . Pilonidal cyst     Patient Active Problem List   Diagnosis Date Noted  . Vitamin D deficiency 01/18/2017  . Obese 03/31/2015  . 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 LEEP     CIN II    OB History    Gravida  1   Para  1   Term      Preterm      AB      Living  1     SAB      TAB      Ectopic      Multiple      Live Births               Home Medications    Prior to Admission medications   Medication Sig Start Date End Date Taking? Authorizing Provider  metFORMIN (GLUCOPHAGE) 500 MG tablet Take 1 tablet (500 mg total) by mouth daily with breakfast. 07/26/18   Quillian Quince D, MD  Multiple Vitamin (MULTIVITAMIN) tablet Take 1 tablet by mouth daily.    [provider]  Vitamin D, Ergocalciferol, (DRISDOL) 1.25 MG (50000 UT) CAPS capsule Take 1 capsule (50,000 Units total) by mouth every 7 (seven) days. 07/26/18   Wilder Glade, MD    Family History Family History    Problem Relation Age of Onset  . Lung cancer Paternal Grandmother        smoker  . Hypertension Paternal Grandmother   . Hypothyroidism Paternal Grandmother   . Congestive Heart Failure Father        had been on dialysis x 10 years  . Diabetes Father   . High Cholesterol Father   . Heart disease Father   . Kidney disease Father   . Cancer Father   . Obesity Father   . Hypertension Paternal Grandfather   . Kidney failure Paternal Grandfather   . Stroke Maternal Grandmother   . Depression Mother   . Sleep apnea Mother   . Alcoholism Mother     Social History Social History   Tobacco Use  . Smoking status: Never Smoker  . Smokeless tobacco: Never Used  Substance Use Topics  . Alcohol use: Yes    Alcohol/week: 0.0 - 1.0 standard drinks    Comment: socially  . Drug use: No     Allergies   Hydrocodone, Oxycodone, and Penicillins   Review of Systems Review of Systems  Constitutional: Negative.  Respiratory: Negative.   Cardiovascular: Negative.   Gastrointestinal: Negative.   Genitourinary: Positive for frequency, urgency and vaginal discharge.       Malodorous  Musculoskeletal: Positive for back pain.  All other systems reviewed and are negative.    Physical Exam Triage Vital Signs ED Triage Vitals  Enc Vitals Group     BP      Pulse      Resp      Temp      Temp src      SpO2      Weight      Height      Head Circumference      Peak Flow      Pain Score      Pain Loc      Pain Edu?      Excl. in [place]?    No data found.  Updated Vital Signs BP 134/90 (BP Location: Right Arm)   Pulse 91   Temp 98.3 F (36.8 C) (Oral)   Resp 16   LMP 01/10/2019   SpO2 96%   Visual Acuity Right Eye Distance:   Left Eye Distance:   Bilateral Distance:    Right Eye Near:   Left Eye Near:    Bilateral Near:     Physical Exam Vitals and nursing note reviewed.  Constitutional:      General: She is not in acute distress.    Appearance: Normal  appearance. She is not ill-appearing or toxic-appearing.  Cardiovascular:     Rate and Rhythm: Normal rate and regular rhythm.     Pulses: Normal pulses.     Heart sounds: Normal heart sounds. No murmur.  Pulmonary:     Effort: Pulmonary effort is normal. No respiratory distress.     Breath sounds: Normal breath sounds.  Chest:     Chest wall: No tenderness.  Abdominal:     General: Abdomen is flat. Bowel sounds are normal. There is no distension.     Palpations: There is no mass.     Tenderness: There is no abdominal tenderness. There is no right CVA tenderness, left CVA tenderness, guarding or rebound.     Hernia: No hernia is present.  Neurological:     Mental Status: She is alert.      UC Treatments / Results  Labs (all labs ordered are listed, but only abnormal results are displayed) Labs Reviewed  POCT URINALYSIS DIP (MANUAL ENTRY) - Abnormal; Notable for the following components:      Result Value   Spec Grav, UA >=1.030 (*)    Blood, UA trace-intact (*)    All other components within normal limits  URINE CULTURE  CERVICOVAGINAL ANCILLARY ONLY    EKG   Radiology No results found.  Procedures Procedures (including critical care time)  Medications Ordered in UC Medications - No data to display  Initial Impression / Assessment and Plan / UC Course  I have reviewed the triage vital signs and the nursing notes.  Pertinent labs & imaging results that were available during my care of the patient were reviewed by me and considered in my medical decision making (see chart for details).    Point-of-care urine analysis was ordered result was reviewed.  Results showed trace of blood.  Sample will be sent for culture. STD screening was completed We will treat patient for a yeast Diflucan 150 mg prescribed  Final Clinical Impressions(s) / UC Diagnoses   Final diagnoses:  Frequent urination  Urgency  of micturition  Screen for STD (sexually transmitted disease)      Discharge Instructions     Point-of-care urine analysis showed trace of blood STD screening was completed Urine culture sent.  We will call you with the results.   Push fluids and get plenty of rest.   Follow up with PCP if symptoms persists Return here or go to ER if you have any new or worsening symptoms such as fever, worsening abdominal pain, nausea/vomiting, flank pain     ED Prescriptions    None     PDMP not reviewed this encounter.   Durward Parcel, FNP 01/23/19 [phone]

## 2019-01-23 NOTE — ED Triage Notes (Signed)
Pt presents to UC w/ c/o urinary frequency, lower back pain, malodorous urine x4 days.

## 2019-01-24 LAB — URINE CULTURE: Culture: NO GROWTH

## 2019-01-26 LAB — CERVICOVAGINAL ANCILLARY ONLY
Bacterial vaginitis: POSITIVE — AB
Candida vaginitis: NEGATIVE
Chlamydia: NEGATIVE
Neisseria Gonorrhea: NEGATIVE
Trichomonas: NEGATIVE

## 2019-01-27 ENCOUNTER — Telehealth (HOSPITAL_COMMUNITY): Payer: Self-pay | Admitting: Emergency Medicine

## 2019-01-27 ENCOUNTER — Encounter (HOSPITAL_COMMUNITY): Payer: Self-pay

## 2019-01-27 MED ORDER — METRONIDAZOLE 500 MG PO TABS: 500.0000 mg | ORAL_TABLET | Freq: Two times a day (BID) | ORAL | 0 refills | Status: AC

## 2019-01-27 NOTE — Telephone Encounter (Signed)
Bacterial vaginosis is positive. Pt needs treatment. Flagyl 500 mg BID x 7 days #14 no refills sent to patients pharmacy of choice.    Attempted to reach patient. No answer at this time. Voicemail full     

## 2019-01-27 NOTE — Telephone Encounter (Signed)
Patient contacted by phone and made aware of  bv  results. Pt verbalized understanding and had all questions answered.    

## 2019-03-06 ENCOUNTER — Ambulatory Visit: Payer: BC Managed Care – PPO | Attending: Internal Medicine

## 2019-03-06 DIAGNOSIS — Z23 Encounter for immunization: Secondary | ICD-10-CM | POA: Insufficient documentation

## 2019-03-06 NOTE — Progress Notes (Signed)
   Covid-19 Vaccination Clinic  Name:  Katie Curtis    MRN: [id number] DOB: Feb 09, 1980  03/06/2019  Ms. Marrone was observed post Covid-19 immunization for 15 minutes without incidence. She was provided with Vaccine Information Sheet and instruction to access the V-Safe system.   Ms. Dukeman was instructed to call 911 with any severe reactions post vaccine: Marland Kitchen Difficulty breathing  . Swelling of your face and throat  . A fast heartbeat  . A bad rash all over your body  . Dizziness and weakness    Immunizations Administered    Name Date Dose VIS Date Route   Moderna COVID-19 Vaccine 03/06/2019  4:53 PM 0.5 mL 12/07/2018 Intramuscular   Manufacturer: Moderna   Lot: [id number]   NDC: 14782-956-21

## 2019-03-21 ENCOUNTER — Ambulatory Visit: Payer: BC Managed Care – PPO | Admitting: Obstetrics & Gynecology

## 2019-03-21 ENCOUNTER — Other Ambulatory Visit (HOSPITAL_COMMUNITY)
Admission: RE | Admit: 2019-03-21 | Discharge: 2019-03-21 | Disposition: A | Discharge status: Home / Self Care | Payer: BC Managed Care – PPO | Source: Ambulatory Visit | Attending: Obstetrics & Gynecology | Admitting: Obstetrics & Gynecology

## 2019-03-21 ENCOUNTER — Other Ambulatory Visit: Payer: Self-pay

## 2019-03-21 ENCOUNTER — Encounter: Payer: Self-pay | Admitting: Obstetrics & Gynecology

## 2019-03-21 VITALS — BP 138/80 | HR 88 | Temp 98.2°F | Resp 10 | Ht 64.0 in | Wt 207.0 lb

## 2019-03-21 DIAGNOSIS — Z8619 Personal history of other infectious and parasitic diseases: Secondary | ICD-10-CM

## 2019-03-21 DIAGNOSIS — Z113 Encounter for screening for infections with a predominantly sexual mode of transmission: Secondary | ICD-10-CM | POA: Insufficient documentation

## 2019-03-21 DIAGNOSIS — Z124 Encounter for screening for malignant neoplasm of cervix: Secondary | ICD-10-CM | POA: Diagnosis not present

## 2019-03-21 DIAGNOSIS — R7303 Prediabetes: Secondary | ICD-10-CM

## 2019-03-21 DIAGNOSIS — Z01419 Encounter for gynecological examination (general) (routine) without abnormal findings: Secondary | ICD-10-CM

## 2019-03-21 NOTE — Progress Notes (Signed)
39 y.o. G1P1 Single Black or [place] American female here for annual exam.  Has started the Covid vaccination.  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    Cycles are regular.    Patient's last menstrual period was 02/21/2019.          Sexually active: Yes.    The current method of family planning is none.    Exercising: No.  The patient does not participate in regular exercise at present. Smoker:  no  Health Maintenance: Pap:   03/19/18 Neg:Neg HR HPV  01/12/17 Neg. HR HPV:+detected             01/10/16 Neg. HR HPV:neg  History of abnormal Pap:  Yes, LEEP 2011. CIN 1 MMG:  never Colonoscopy:  n/a BMD:   n/a TDaP:  2014 Hep C testing: n/a Screening Labs: discuss today   reports that she has never smoked. She has never used smokeless tobacco. She reports current alcohol use. She reports that she does not use drugs.  Past Medical History:  Diagnosis Date  . Abnormal Pap smear   . Anemia   . Pilonidal cyst     Past Surgical History:  Procedure Laterality Date  . LEEP     CIN II    Current Outpatient Medications  Medication Sig Dispense Refill  . Multiple Vitamin (MULTIVITAMIN) tablet Take 1 tablet by mouth daily.    . metFORMIN (GLUCOPHAGE) 500 MG tablet Take 1 tablet (500 mg total) by mouth daily with breakfast. (Patient not taking: Reported on 03/21/2019) 30 tablet 0  . Vitamin D, Ergocalciferol, (DRISDOL) 1.25 MG (50000 UT) CAPS capsule Take 1 capsule (50,000 Units total) by mouth every 7 (seven) days. (Patient not taking: Reported on 03/21/2019) 4 capsule 0   No current facility-administered medications for this visit.    Family History  Problem Relation Age of Onset  . Lung cancer Paternal Grandmother        smoker  . Hypertension Paternal Grandmother   . Hypothyroidism Paternal Grandmother   . Congestive Heart Failure Father        had been on dialysis x 10 years  . Diabetes Father   . High Cholesterol  Father   . Heart disease Father   . Kidney disease Father   . Cancer Father   . Obesity Father   . Hypertension Paternal Grandfather   . Kidney failure Paternal Grandfather   . Stroke Maternal Grandmother   . Depression Mother   . Sleep apnea Mother   . Alcoholism Mother     Review of Systems  All other systems reviewed and are negative.   Exam:   BP 138/80 (BP Location: Right Arm, Patient Position: Sitting, Cuff Size: Normal)   Pulse 88   Temp 98.2 F (36.8 C) (Temporal)   Resp 10   Ht 5\' 4"  (1.626 m)   Wt 207 lb (93.9 kg)   LMP 02/21/2019   BMI 35.53 kg/m     Height: 5\' 4"  (162.6 cm)  Ht Readings from Last 3 Encounters:  03/21/19 5\' 4"  (1.626 m)  08/09/18 5\' 4"  (1.626 m)  07/26/18 5\' 4"  (1.626 m)    General appearance: alert, cooperative and appears stated age Head: Normocephalic, without obvious abnormality, atraumatic Neck: no adenopathy, supple, symmetrical, trachea midline and thyroid normal to inspection and palpation Lungs: clear to auscultation bilaterally Breasts: normal appearance, no masses or tenderness Heart: regular rate and  rhythm Abdomen: soft, non-tender; bowel sounds normal; no masses,  no organomegaly Extremities: extremities normal, atraumatic, no cyanosis or edema Skin: Skin color, texture, turgor normal. No rashes or lesions Lymph nodes: Cervical, supraclavicular, and axillary nodes normal. No abnormal inguinal nodes palpated Neurologic: Grossly normal  Pelvic: External genitalia:  no lesions              Urethra:  normal appearing urethra with no masses, tenderness or lesions              Bartholins and Skenes: normal                 Vagina: normal appearing vagina with normal color and discharge, no lesions              Cervix: no lesions              Pap taken: Yes.   Bimanual Exam:  Uterus:  normal size, contour, position, consistency, mobility, non-tender              Adnexa: normal adnexa and no mass, fullness, tenderness                Rectovaginal: Confirms               Anus:  normal sphincter tone, no lesions  Chaperone, Zenovia Jordan, CMA, was present for exam.  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  She is going to schedule this at [facility] pap smear and HR HPV obtained today GC/Chl/trich obtained today HIV, RPR, Hep C antibody obtained today HbA1C obtained today Colonoscopy screening recommended Return annually or prn

## 2019-03-21 NOTE — Patient Instructions (Signed)
[facility] [address] [place],  [place]  [address] Main: [phone]  

## 2019-03-22 LAB — HIV ANTIBODY (ROUTINE TESTING W REFLEX): HIV Screen 4th Generation wRfx: NONREACTIVE

## 2019-03-22 LAB — HEMOGLOBIN A1C
Est. average glucose Bld gHb Est-mCnc: 117 mg/dL
Hgb A1c MFr Bld: 5.7 % — ABNORMAL HIGH (ref 4.8–5.6)

## 2019-03-22 LAB — RPR: RPR Ser Ql: NONREACTIVE

## 2019-03-22 LAB — HEPATITIS C ANTIBODY: Hep C Virus Ab: <0.1 s/co ratio (ref 0.0–0.9)

## 2019-03-23 LAB — CYTOLOGY - PAP
Adequacy: ABSENT
Chlamydia: NEGATIVE
Comment: NEGATIVE
Comment: NEGATIVE
Comment: NEGATIVE
Comment: NORMAL
Diagnosis: NEGATIVE
High risk HPV: NEGATIVE
Neisseria Gonorrhea: NEGATIVE
Trichomonas: NEGATIVE

## 2019-04-09 ENCOUNTER — Ambulatory Visit: Payer: BC Managed Care – PPO | Attending: Internal Medicine

## 2019-04-09 ENCOUNTER — Ambulatory Visit: Payer: BC Managed Care – PPO

## 2019-04-09 DIAGNOSIS — Z23 Encounter for immunization: Secondary | ICD-10-CM

## 2019-04-09 NOTE — Progress Notes (Signed)
   Covid-19 Vaccination Clinic  Name:  Katie Curtis    MRN: [id number] DOB: 16-Aug-1980  04/09/2019  [name] was observed post Covid-19 immunization for 15 minutes without incident. She was provided with Vaccine Information Sheet and instruction to access the V-Safe system.   [name] was instructed to call 911 with any severe reactions post vaccine: Marland Kitchen Difficulty breathing  . Swelling of face and throat  . A fast heartbeat  . A bad rash all over body  . Dizziness and weakness   Immunizations Administered    Name Date Dose VIS Date Route   Moderna COVID-19 Vaccine 04/09/2019  9:59 AM 0.5 mL 12/07/2018 Intramuscular   Manufacturer: Moderna   Lot: [id number]   NDC: 11031-594-58

## 2019-04-12 ENCOUNTER — Other Ambulatory Visit: Payer: Self-pay

## 2019-04-12 ENCOUNTER — Ambulatory Visit (INDEPENDENT_AMBULATORY_CARE_PROVIDER_SITE_OTHER): Payer: BC Managed Care – PPO | Admitting: Nurse Practitioner

## 2019-04-12 ENCOUNTER — Ambulatory Visit: Payer: BC Managed Care – PPO | Admitting: Nurse Practitioner

## 2019-04-12 ENCOUNTER — Encounter: Payer: Self-pay | Admitting: Nurse Practitioner

## 2019-04-12 VITALS — BP 110/68 | HR 84 | Temp 97.8°F | Resp 18 | Ht 63.5 in | Wt 205.0 lb

## 2019-04-12 DIAGNOSIS — R7303 Prediabetes: Secondary | ICD-10-CM

## 2019-04-12 DIAGNOSIS — Z13228 Encounter for screening for other metabolic disorders: Secondary | ICD-10-CM

## 2019-04-12 DIAGNOSIS — Z7689 Persons encountering health services in other specified circumstances: Secondary | ICD-10-CM

## 2019-04-12 DIAGNOSIS — Z13220 Encounter for screening for lipoid disorders: Secondary | ICD-10-CM | POA: Diagnosis not present

## 2019-04-12 DIAGNOSIS — E669 Obesity, unspecified: Secondary | ICD-10-CM

## 2019-04-12 MED ORDER — PHENTERMINE HCL 37.5 MG PO TABS: 37.5000 mg | ORAL_TABLET | Freq: Every day | ORAL | 0 refills | Status: DC

## 2019-04-12 NOTE — Patient Instructions (Signed)
Established care labs to be drawn for screening of lipid and metabolic PreDM history: A1C completed last month at GYN along with PAP and Mammogram ordered to be scheduled by pt. Urine microalbumin today Weight Loss: RX follow low carb lean protein diet, exercise 20 minutes 5 days per week.  If wish to continue follow up in 4 weeks.

## 2019-04-12 NOTE — Progress Notes (Signed)
New Patient Office Visit  Subjective:  Patient ID: Katie Curtis, female    DOB: Mar 12, 1980  Age: 39 y.o. MRN: [id number]  CC: establish care with office  HPI Katie Curtis is a 39 year old female presenting to establish care. She has a GYN that she completed a visit with last month to include normal PAP, labs reviewed that included A1C, and was provided with mammogram order that she will schedule. Today plan to obtain labs for micoralbumin, lipid screen, metabolic screen. No HIV indicated and pt declined, Tdap and Flu have been declined by pt. She has second COVID vaccine Saturday.  Pt is overweight and reports that she has struggled with getting under 180 lbs in fact has gained 20 lbs since last March. She has tried weight loss phentermine that helped her years ago and would like to try again for a "kick start". She has started to walk 40 minutes 2-3 times per week. She also has joined [employer].   Time spent discussing DM II diet and exercise to lifestyle manage. She is compelled.   No cp/ct, gu/gi sxs, pain, sob, edema, or recent falls or injury.   Past Medical History:  Diagnosis Date  . Abnormal Pap smear   . Anemia   . Pilonidal cyst   . Prediabetes     Past Surgical History:  Procedure Laterality Date  . LEEP     CIN II    Family History  Problem Relation Age of Onset  . Lung cancer Paternal Grandmother        smoker  . Hypertension Paternal Grandmother   . Hypothyroidism Paternal Grandmother   . Congestive Heart Failure Father        had been on dialysis x 10 years  . Diabetes Father   . High Cholesterol Father   . Heart disease Father   . Kidney disease Father   . Cancer Father   . Obesity Father   . Hypertension Paternal Grandfather   . Kidney failure Paternal Grandfather   . Stroke Maternal Grandmother   . Depression Mother   . Sleep apnea Mother   . Alcoholism Mother     Social History   Socioeconomic History  . Marital status: Single    Spouse name: Not on file  . Number of children: Not on file  . Years of education: Not on file  . Highest education level: Not on file  Occupational History  . Occupation: Pharmacist, hospital  Tobacco Use  . Smoking status: Never Smoker  . Smokeless tobacco: Never Used  Substance and Sexual Activity  . Alcohol use: Yes    Alcohol/week: 0.0 - 1.0 standard drinks    Comment: socially  . Drug use: No  . Sexual activity: Not Currently    Birth control/protection: None  Other Topics Concern  . Not on file  Social History Narrative  . Not on file   Social Determinants of Health   Financial Resource Strain:   . Difficulty of Paying Living Expenses:   Food Insecurity:   . Worried About Charity fundraiser in the Last Year:   . Arboriculturist in the Last Year:   Transportation Needs:   . Film/video editor (Medical):   Marland Kitchen Lack of Transportation (Non-Medical):   Physical Activity:   . Days of Exercise per Week:   . Minutes of Exercise per Session:   Stress:   . Feeling of Stress :   Social Connections:   .  Frequency of Communication with Friends and Family:   . Frequency of Social Gatherings with Friends and Family:   . Attends Religious Services:   . Active Member of Clubs or Organizations:   . Attends Banker Meetings:   Marland Kitchen Marital Status:   Intimate Partner Violence:   . Fear of Current or Ex-Partner:   . Emotionally Abused:   Marland Kitchen Physically Abused:   . Sexually Abused:     ROS Review of Systems  All other systems reviewed and are negative.   Objective:   Today's Vitals: BP 110/68 (BP Location: Left Arm, Patient Position: Sitting, Cuff Size: Large)   Pulse 84   Temp 97.8 F (36.6 C) (Temporal)   Resp 18   Ht 5' 3.5" (1.613 m)   Wt 205 lb (93 kg)   LMP 03/25/2019 (LMP Unknown)   SpO2 98%   BMI 35.74 kg/m   Physical Exam Vitals and nursing note reviewed.  Constitutional:      Appearance: Normal appearance. She is well-developed and well-groomed. She  is obese.  HENT:     Head: Normocephalic.     Right Ear: Hearing and external ear normal.     Left Ear: Hearing and external ear normal.     Nose: Nose normal.  Eyes:     General: Lids are normal. Lids are everted, no foreign bodies appreciated.     Extraocular Movements: Extraocular movements intact.  Neck:     Thyroid: No thyromegaly.     Vascular: No carotid bruit or JVD.  Cardiovascular:     Rate and Rhythm: Normal rate and regular rhythm.     Pulses: Normal pulses.     Heart sounds: Normal heart sounds, S1 normal and S2 normal.  Pulmonary:     Effort: Pulmonary effort is normal.     Breath sounds: Normal breath sounds.  Chest:     Chest wall: No deformity.  Abdominal:     General: Abdomen is flat. Bowel sounds are normal.     Palpations: There is no hepatomegaly or splenomegaly.  Musculoskeletal:        General: Normal range of motion.     Cervical back: Normal range of motion and neck supple.     Right lower leg: No edema.     Left lower leg: No edema.  Lymphadenopathy:     Cervical: No cervical adenopathy.  Skin:    General: Skin is warm and dry.     Capillary Refill: Capillary refill takes less than 2 seconds.  Neurological:     General: No focal deficit present.     Mental Status: She is alert and oriented to person, place, and time.  Psychiatric:        Attention and Perception: Attention normal.        Mood and Affect: Mood normal.        Speech: Speech normal.        Behavior: Behavior is cooperative.        Cognition and Memory: Cognition normal.     Assessment & Plan:  Established care labs to be drawn for screening of lipid and metabolic PreDM history: A1C completed last month at GYN along with PAP and Mammogram ordered to be scheduled by pt. Urine microalbumin today  DM II: lifestyle control, A1C every 4 months, weight loss, low carb low sugar lean protein diet.   Weight loss: RX appetite suppressant, exercise 20 minutes 5 days per week, low carb  low sugar lean protein  diet, follow up in 4 weeks if desire to continue appetite suppressant.  Problem List Items Addressed This Visit      Other   Obese   Relevant Medications   phentermine (ADIPEX-P) 37.5 MG tablet   Prediabetes - Primary   Relevant Orders   COMPLETE METABOLIC PANEL WITH GFR   CBC with Differential/Platelet   Lipid panel   Microalbumin, urine    Other Visit Diagnoses    Encounter to establish care       Relevant Orders   COMPLETE METABOLIC PANEL WITH GFR   CBC with Differential/Platelet   Lipid panel   Microalbumin, urine   Lipid screening       Relevant Orders   COMPLETE METABOLIC PANEL WITH GFR   CBC with Differential/Platelet   Lipid panel   Microalbumin, urine   Screening for metabolic disorder       Relevant Orders   COMPLETE METABOLIC PANEL WITH GFR   CBC with Differential/Platelet   Lipid panel   Microalbumin, urine      Outpatient Encounter Medications as of 04/12/2019  Medication Sig  . Multiple Vitamin (MULTIVITAMIN) tablet Take 1 tablet by mouth daily.  . phentermine (ADIPEX-P) 37.5 MG tablet Take 1 tablet (37.5 mg total) by mouth daily before breakfast.   No facility-administered encounter medications on file as of 04/12/2019.    Follow-up: Return in about 6 months (around 10/12/2019).   Elmore Guise, FNP

## 2019-04-13 LAB — LIPID PANEL
Cholesterol: 168 mg/dL (ref <200)
HDL: 48 mg/dL — ABNORMAL LOW (ref >=50)
LDL Cholesterol (Calc): 102 mg/dL (calc) — ABNORMAL HIGH
Non-HDL Cholesterol (Calc): 120 mg/dL (calc) (ref <130)
Total CHOL/HDL Ratio: 3.5 (calc) (ref <5.0)
Triglycerides: 89 mg/dL (ref <150)

## 2019-04-13 LAB — CBC WITH DIFFERENTIAL/PLATELET
Absolute Monocytes: 979 cells/uL — ABNORMAL HIGH (ref 200–950)
Basophils Absolute: 51 cells/uL (ref 0–200)
Basophils Relative: 0.5 %
Eosinophils Absolute: 755 cells/uL — ABNORMAL HIGH (ref 15–500)
Eosinophils Relative: 7.4 %
HCT: 36.9 % (ref 35.0–45.0)
Hemoglobin: 11.9 g/dL (ref 11.7–15.5)
Lymphs Abs: 2356 cells/uL (ref 850–3900)
MCH: 28.5 pg (ref 27.0–33.0)
MCHC: 32.2 g/dL (ref 32.0–36.0)
MCV: 88.5 fL (ref 80.0–100.0)
MPV: 10.9 fL (ref 7.5–12.5)
Monocytes Relative: 9.6 %
Neutro Abs: 6059 cells/uL (ref 1500–7800)
Neutrophils Relative %: 59.4 %
Platelets: 315 10*3/uL (ref 140–400)
RBC: 4.17 10*6/uL (ref 3.80–5.10)
RDW: 13.5 % (ref 11.0–15.0)
Total Lymphocyte: 23.1 %
WBC: 10.2 10*3/uL (ref 3.8–10.8)

## 2019-04-13 LAB — MICROALBUMIN, URINE: Microalb, Ur: 0.2 mg/dL

## 2019-04-13 LAB — COMPLETE METABOLIC PANEL WITH GFR
AG Ratio: 1.6 (calc) (ref 1.0–2.5)
ALT: 14 U/L (ref 6–29)
AST: 12 U/L (ref 10–30)
Albumin: 4.2 g/dL (ref 3.6–5.1)
Alkaline phosphatase (APISO): 65 U/L (ref 31–125)
BUN: 11 mg/dL (ref 7–25)
CO2: 24 mmol/L (ref 20–32)
Calcium: 9.5 mg/dL (ref 8.6–10.2)
Chloride: 106 mmol/L (ref 98–110)
Creat: 0.72 mg/dL (ref 0.50–1.10)
GFR, Est African American: 121 mL/min/{1.73_m2} (ref >=60)
GFR, Est Non African American: 104 mL/min/{1.73_m2} (ref >=60)
Globulin: 2.7 g/dL (calc) (ref 1.9–3.7)
Glucose, Bld: 81 mg/dL (ref 65–99)
Potassium: 4.1 mmol/L (ref 3.5–5.3)
Sodium: 138 mmol/L (ref 135–146)
Total Bilirubin: 0.3 mg/dL (ref 0.2–1.2)
Total Protein: 6.9 g/dL (ref 6.1–8.1)

## 2019-04-14 ENCOUNTER — Telehealth: Payer: Self-pay

## 2019-04-14 NOTE — Progress Notes (Signed)
Repeat labs in 3 months.

## 2019-04-14 NOTE — Telephone Encounter (Signed)
PA submitted via CMM for Phentermine [facility] 37.5 mg is waiting for a reply from insurance.

## 2019-05-10 ENCOUNTER — Other Ambulatory Visit: Payer: BC Managed Care – PPO

## 2019-05-23 ENCOUNTER — Other Ambulatory Visit: Payer: Self-pay

## 2019-05-23 ENCOUNTER — Ambulatory Visit: Payer: BC Managed Care – PPO | Admitting: Nurse Practitioner

## 2019-05-23 ENCOUNTER — Encounter: Payer: Self-pay | Admitting: Nurse Practitioner

## 2019-05-23 VITALS — BP 116/66 | HR 90 | Temp 97.8°F | Resp 20 | Wt 198.0 lb

## 2019-05-23 DIAGNOSIS — E669 Obesity, unspecified: Secondary | ICD-10-CM | POA: Diagnosis not present

## 2019-05-23 DIAGNOSIS — Z7689 Persons encountering health services in other specified circumstances: Secondary | ICD-10-CM

## 2019-05-23 MED ORDER — PHENTERMINE HCL 37.5 MG PO TABS: 37.5000 mg | ORAL_TABLET | Freq: Every day | ORAL | 0 refills | Status: DC

## 2019-05-23 NOTE — Progress Notes (Signed)
Established Patient Office Visit  Subjective:  Patient ID: Katie Curtis, female    DOB: 04/06/1980  Age: 39 y.o. MRN: [id number]  CC: No chief complaint on file.   HPI Katie Curtis is a 39 year old female presenting one month after initiating appetite suppressant for weight loss. She has lost 6.8 lbs. She is trying to adhere to lean protein, vegetables, low carb, low sugar, increased exercise plan. She feels she is doing good except this weekend her son graduated and she had a party for him when there where lots of left over foods. She would like to continue the regimen.   Past Medical History:  Diagnosis Date  . Abnormal Pap smear   . Anemia   . Pilonidal cyst   . Prediabetes     Past Surgical History:  Procedure Laterality Date  . LEEP     CIN II    Family History  Problem Relation Age of Onset  . Lung cancer Paternal Grandmother        smoker  . Hypertension Paternal Grandmother   . Hypothyroidism Paternal Grandmother   . Congestive Heart Failure Father        had been on dialysis x 10 years  . Diabetes Father   . High Cholesterol Father   . Heart disease Father   . Kidney disease Father   . Cancer Father   . Obesity Father   . Hypertension Paternal Grandfather   . Kidney failure Paternal Grandfather   . Stroke Maternal Grandmother   . Depression Mother   . Sleep apnea Mother   . Alcoholism Mother     Social History   Socioeconomic History  . Marital status: Single    Spouse name: Not on file  . Number of children: Not on file  . Years of education: Not on file  . Highest education level: Not on file  Occupational History  . Occupation: Runner, broadcasting/film/video  Tobacco Use  . Smoking status: Never Smoker  . Smokeless tobacco: Never Used  Substance and Sexual Activity  . Alcohol use: Yes    Alcohol/week: 0.0 - 1.0 standard drinks    Comment: socially  . Drug use: No  . Sexual activity: Not Currently    Birth control/protection: None  Other Topics Concern    . Not on file  Social History Narrative  . Not on file   Social Determinants of Health   Financial Resource Strain:   . Difficulty of Paying Living Expenses:   Food Insecurity:   . Worried About Programme researcher, broadcasting/film/video in the Last Year:   . Barista in the Last Year:   Transportation Needs:   . Freight forwarder (Medical):   Marland Kitchen Lack of Transportation (Non-Medical):   Physical Activity:   . Days of Exercise per Week:   . Minutes of Exercise per Session:   Stress:   . Feeling of Stress :   Social Connections:   . Frequency of Communication with Friends and Family:   . Frequency of Social Gatherings with Friends and Family:   . Attends Religious Services:   . Active Member of Clubs or Organizations:   . Attends Banker Meetings:   Marland Kitchen Marital Status:   Intimate Partner Violence:   . Fear of Current or Ex-Partner:   . Emotionally Abused:   Marland Kitchen Physically Abused:   . Sexually Abused:     Outpatient Medications Prior to Visit  Medication Sig Dispense Refill  .  Multiple Vitamin (MULTIVITAMIN) tablet Take 1 tablet by mouth daily.    . phentermine (ADIPEX-P) 37.5 MG tablet Take 1 tablet (37.5 mg total) by mouth daily before breakfast. 30 tablet 0   No facility-administered medications prior to visit.    Allergies  Allergen Reactions  . Hydrocodone   . Oxycodone   . Penicillins     ROS Review of Systems  All other systems reviewed and are negative.     Objective:    Physical Exam  Constitutional: She is oriented to person, place, and time. Vital signs are normal. She appears well-developed and well-nourished.  HENT:  Head: Normocephalic.  Neck: No JVD present.  Cardiovascular: Normal rate.  Pulmonary/Chest: Effort normal.  Musculoskeletal:        General: No edema. Normal range of motion.     Cervical back: Normal range of motion and neck supple.  Neurological: She is alert and oriented to person, place, and time.  Skin: Skin is dry.   Psychiatric: She has a normal mood and affect. Her speech is normal and behavior is normal. Judgment and thought content normal. Cognition and memory are normal.  Nursing note and vitals reviewed.   There were no vitals taken for this visit. Wt Readings from Last 3 Encounters:  04/12/19 205 lb (93 kg)  03/21/19 207 lb (93.9 kg)  08/09/18 195 lb (88.5 kg)     There are no preventive care reminders to display for this patient.  There are no preventive care reminders to display for this patient.  Lab Results  Component Value Date   TSH 0.581 06/29/2018   Lab Results  Component Value Date   WBC 10.2 04/12/2019   HGB 11.9 04/12/2019   HCT 36.9 04/12/2019   MCV 88.5 04/12/2019   PLT 315 04/12/2019   Lab Results  Component Value Date   NA 138 04/12/2019   K 4.1 04/12/2019   CO2 24 04/12/2019   GLUCOSE 81 04/12/2019   BUN 11 04/12/2019   CREATININE 0.72 04/12/2019   BILITOT 0.3 04/12/2019   ALKPHOS 72 06/29/2018   AST 12 04/12/2019   ALT 14 04/12/2019   PROT 6.9 04/12/2019   ALBUMIN 4.5 06/29/2018   CALCIUM 9.5 04/12/2019   Lab Results  Component Value Date   CHOL 168 04/12/2019   Lab Results  Component Value Date   HDL 48 (L) 04/12/2019   Lab Results  Component Value Date   LDLCALC 102 (H) 04/12/2019   Lab Results  Component Value Date   TRIG 89 04/12/2019   Lab Results  Component Value Date   CHOLHDL 3.5 04/12/2019   Lab Results  Component Value Date   HGBA1C 5.7 (H) 03/21/2019      Assessment & Plan:  Continue to follow Lean meat, low carb, low sugar, increased exercise diet, drink plenty of water daily Problem List Items Addressed This Visit      Other   Obese   Relevant Medications   phentermine (ADIPEX-P) 37.5 MG tablet    Other Visit Diagnoses    Encounter for weight management    -  Primary   Relevant Medications   phentermine (ADIPEX-P) 37.5 MG tablet      Meds ordered this encounter  Medications  . phentermine (ADIPEX-P) 37.5  MG tablet    Sig: Take 1 tablet (37.5 mg total) by mouth daily before breakfast.    Dispense:  30 tablet    Refill:  0    Follow-up: Return in about 4 weeks (  around 06/20/2019).    Annie Main, FNP

## 2019-05-23 NOTE — Patient Instructions (Addendum)
Continue to follow Lean meat, low carb, low sugar, increased exercise diet, drink plenty of water daily

## 2019-08-12 ENCOUNTER — Other Ambulatory Visit: Payer: BC Managed Care – PPO

## 2019-08-12 ENCOUNTER — Ambulatory Visit: Payer: BC Managed Care – PPO | Admitting: Obstetrics & Gynecology

## 2019-08-12 ENCOUNTER — Other Ambulatory Visit: Payer: Self-pay

## 2019-08-12 DIAGNOSIS — E669 Obesity, unspecified: Secondary | ICD-10-CM

## 2019-08-12 DIAGNOSIS — R7303 Prediabetes: Secondary | ICD-10-CM

## 2019-08-12 DIAGNOSIS — Z13220 Encounter for screening for lipoid disorders: Secondary | ICD-10-CM

## 2019-08-16 ENCOUNTER — Other Ambulatory Visit: Payer: Self-pay | Admitting: Nurse Practitioner

## 2019-08-16 DIAGNOSIS — D539 Nutritional anemia, unspecified: Secondary | ICD-10-CM

## 2019-08-16 LAB — COMPLETE METABOLIC PANEL WITH GFR
AG Ratio: 1.4 (calc) (ref 1.0–2.5)
ALT: 10 U/L (ref 6–29)
AST: 9 U/L — ABNORMAL LOW (ref 10–30)
Albumin: 4.1 g/dL (ref 3.6–5.1)
Alkaline phosphatase (APISO): 68 U/L (ref 31–125)
BUN: 10 mg/dL (ref 7–25)
CO2: 22 mmol/L (ref 20–32)
Calcium: 8.9 mg/dL (ref 8.6–10.2)
Chloride: 109 mmol/L (ref 98–110)
Creat: 0.69 mg/dL (ref 0.50–1.10)
GFR, Est African American: 125 mL/min/{1.73_m2} (ref >=60)
GFR, Est Non African American: 108 mL/min/{1.73_m2} (ref >=60)
Globulin: 2.9 g/dL (calc) (ref 1.9–3.7)
Glucose, Bld: 87 mg/dL (ref 65–99)
Potassium: 3.9 mmol/L (ref 3.5–5.3)
Sodium: 140 mmol/L (ref 135–146)
Total Bilirubin: 0.2 mg/dL (ref 0.2–1.2)
Total Protein: 7 g/dL (ref 6.1–8.1)

## 2019-08-16 LAB — CBC WITH DIFFERENTIAL/PLATELET
Absolute Monocytes: 696 cells/uL (ref 200–950)
Basophils Absolute: 54 cells/uL (ref 0–200)
Basophils Relative: 0.5 %
Eosinophils Absolute: 300 cells/uL (ref 15–500)
Eosinophils Relative: 2.8 %
HCT: 35.9 % (ref 35.0–45.0)
Hemoglobin: 11.4 g/dL — ABNORMAL LOW (ref 11.7–15.5)
Lymphs Abs: 3424 cells/uL (ref 850–3900)
MCH: 28.4 pg (ref 27.0–33.0)
MCHC: 31.8 g/dL — ABNORMAL LOW (ref 32.0–36.0)
MCV: 89.3 fL (ref 80.0–100.0)
MPV: 10 fL (ref 7.5–12.5)
Monocytes Relative: 6.5 %
Neutro Abs: 6227 cells/uL (ref 1500–7800)
Neutrophils Relative %: 58.2 %
Platelets: 332 10*3/uL (ref 140–400)
RBC: 4.02 10*6/uL (ref 3.80–5.10)
RDW: 13.3 % (ref 11.0–15.0)
Total Lymphocyte: 32 %
WBC: 10.7 10*3/uL (ref 3.8–10.8)

## 2019-08-16 LAB — TEST AUTHORIZATION

## 2019-08-16 LAB — VITAMIN B12: Vitamin B-12: 682 pg/mL (ref 200–1100)

## 2019-08-16 LAB — LIPID PANEL
Cholesterol: 184 mg/dL (ref <200)
HDL: 55 mg/dL (ref >=50)
LDL Cholesterol (Calc): 114 mg/dL (calc) — ABNORMAL HIGH
Non-HDL Cholesterol (Calc): 129 mg/dL (calc) (ref <130)
Total CHOL/HDL Ratio: 3.3 (calc) (ref <5.0)
Triglycerides: 60 mg/dL (ref <150)

## 2019-08-16 LAB — FOLATE: Folate: 3.4 ng/mL — ABNORMAL LOW

## 2019-08-16 NOTE — Progress Notes (Signed)
Follow a low fat/cholesterol diet, get at least 20 minutes of exercise daily at least 4 times per week.   Is there a b12 or folate deficiency history. Add levels to lab if can if not pt will need to come in for draw.   Follow up lab and appt in 6 month

## 2019-09-21 ENCOUNTER — Other Ambulatory Visit (HOSPITAL_COMMUNITY): Payer: Self-pay

## 2019-09-21 ENCOUNTER — Other Ambulatory Visit (HOSPITAL_COMMUNITY): Payer: Self-pay | Admitting: Obstetrics & Gynecology

## 2019-09-21 DIAGNOSIS — Z1231 Encounter for screening mammogram for malignant neoplasm of breast: Secondary | ICD-10-CM

## 2019-09-26 ENCOUNTER — Other Ambulatory Visit: Payer: Self-pay

## 2019-09-26 ENCOUNTER — Ambulatory Visit (HOSPITAL_COMMUNITY)
Admission: RE | Admit: 2019-09-26 | Discharge: 2019-09-26 | Disposition: A | Discharge status: Home / Self Care | Payer: BC Managed Care – PPO | Source: Ambulatory Visit | Attending: Obstetrics & Gynecology | Admitting: Obstetrics & Gynecology

## 2019-09-26 DIAGNOSIS — Z1231 Encounter for screening mammogram for malignant neoplasm of breast: Secondary | ICD-10-CM | POA: Diagnosis not present

## 2019-09-29 ENCOUNTER — Other Ambulatory Visit (HOSPITAL_COMMUNITY): Payer: Self-pay | Admitting: Obstetrics & Gynecology

## 2019-09-29 DIAGNOSIS — R928 Other abnormal and inconclusive findings on diagnostic imaging of breast: Secondary | ICD-10-CM

## 2019-10-06 ENCOUNTER — Other Ambulatory Visit: Payer: Self-pay

## 2019-10-06 ENCOUNTER — Ambulatory Visit (HOSPITAL_COMMUNITY)
Admission: RE | Admit: 2019-10-06 | Discharge: 2019-10-06 | Disposition: A | Discharge status: Home / Self Care | Payer: BC Managed Care – PPO | Source: Ambulatory Visit | Attending: Obstetrics & Gynecology | Admitting: Obstetrics & Gynecology

## 2019-10-06 DIAGNOSIS — R928 Other abnormal and inconclusive findings on diagnostic imaging of breast: Secondary | ICD-10-CM | POA: Diagnosis not present

## 2019-10-12 ENCOUNTER — Ambulatory Visit: Payer: BC Managed Care – PPO | Admitting: Nurse Practitioner

## 2020-01-10 ENCOUNTER — Other Ambulatory Visit: Payer: Self-pay

## 2020-01-10 DIAGNOSIS — Z20822 Contact with and (suspected) exposure to covid-19: Secondary | ICD-10-CM

## 2020-01-12 LAB — NOVEL CORONAVIRUS, NAA: SARS-CoV-2, NAA: NOT DETECTED

## 2020-01-12 LAB — SARS-COV-2, NAA 2 DAY TAT

## 2020-01-12 LAB — SPECIMEN STATUS REPORT

## 2020-03-26 ENCOUNTER — Ambulatory Visit: Payer: Self-pay

## 2020-04-17 ENCOUNTER — Encounter: Payer: Self-pay | Admitting: Nurse Practitioner

## 2020-04-17 ENCOUNTER — Other Ambulatory Visit: Payer: Self-pay

## 2020-04-17 ENCOUNTER — Ambulatory Visit: Payer: BC Managed Care – PPO | Admitting: Nurse Practitioner

## 2020-04-17 VITALS — BP 120/80 | HR 86 | Temp 98.3°F | Ht 63.5 in | Wt 208.2 lb

## 2020-04-17 DIAGNOSIS — R7303 Prediabetes: Secondary | ICD-10-CM

## 2020-04-17 DIAGNOSIS — R32 Unspecified urinary incontinence: Secondary | ICD-10-CM | POA: Diagnosis not present

## 2020-04-17 DIAGNOSIS — E559 Vitamin D deficiency, unspecified: Secondary | ICD-10-CM

## 2020-04-17 DIAGNOSIS — Z6836 Body mass index (BMI) 36.0-36.9, adult: Secondary | ICD-10-CM

## 2020-04-17 NOTE — Progress Notes (Signed)
Subjective:    Patient ID: Katie Curtis, female    DOB: 1980/01/07, 40 y.o.   MRN: [id number]  HPI: Katie Curtis is a 40 y.o. female presenting for urinary leakage.   Chief Complaint  Patient presents with  . bladder leakage    1  month since increased water intake  . Weight Loss    Wants to talk about weight loss and monitoring, best alteratives   URINARY SYMPTOMS Started since she was drinking more water.  Drinks coffee, lots of tea, soda occasionally.  Drinks alcohol twice per day.  Urinary leakage is all day long, not worse when laughing or coughing. Does not have to change pads, 1 sanitary pad takes care problem. Duration: 1 month Dysuria: no Urinary frequency: no Urgency: no Small volume voids: no Symptom severity: mild Urinary incontinence: yes Foul odor: no Hematuria: no Abdominal pain: no Back pain: no Suprapubic pain/pressure: no Flank pain: no Fever:  No Nausea: no Vomiting: no Relief with cranberry juice: no Relief with pyridium: no Status: better/worse/stable Previous urinary tract infection: no Recurrent urinary tract infection: no Sexual activity: No sexually active/monogomous/practicing safe sex History of sexually transmitted disease: no Vaginal discharge: no Treatments attempted: Nothing  WEIGHT GAIN Reports she keeps stopping and starting with her dietary and exercises.  She is walking 30 minutes daily.  She has not been doing anything consistently.  Of note, she does not have a personal history of thyroid cancer or family history of thyroid cancer. Duration: chronic  Previous attempts at weight loss: [employer] Complications of obesity:  Peak weight:   Weight loss goal: 170 Weight loss to date:  Requesting obesity pharmacotherapy: no Current weight loss supplements/medications: no Previous weight loss supplements/meds: Yes-was on phentermine in the past   Allergies  Allergen Reactions  . Hydrocodone   . Oxycodone   .  Penicillins     Outpatient Encounter Medications as of 04/17/2020  Medication Sig  . Dulaglutide (TRULICITY) 0.75 MG/0.5ML SOPN Inject 0.75 mg into the skin once a week.  Marland Kitchen oxybutynin (DITROPAN-XL) 5 MG 24 hr tablet Take 1 tablet (5 mg total) by mouth at bedtime.  . [DISCONTINUED] Multiple Vitamin (MULTIVITAMIN) tablet Take 1 tablet by mouth daily.  . [DISCONTINUED] phentermine (ADIPEX-P) 37.5 MG tablet Take 1 tablet (37.5 mg total) by mouth daily before breakfast.   No facility-administered encounter medications on file as of 04/17/2020.    Patient Active Problem List   Diagnosis Date Noted  . Urinary incontinence 04/18/2020  . BMI 36.0-36.9,adult 04/18/2020  . Prediabetes 03/21/2019  . Vitamin D deficiency 01/18/2017  . 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 Abnormal Pap smear   . Anemia   . Pilonidal cyst   . Prediabetes    Relevant past medical, surgical, family and social history reviewed and updated as indicated. Interim medical history since our last visit reviewed.  Review of Systems Per HPI unless specifically indicated above     Objective:    BP 120/80   Pulse 86   Temp 98.3 F (36.8 C)   Ht 5' 3.5" (1.613 m)   Wt 208 lb 3.2 oz (94.4 kg)   LMP 04/02/2020   SpO2 99%   BMI 36.30 kg/m   Wt Readings from Last 3 Encounters:  04/17/20 208 lb 3.2 oz (94.4 kg)  05/23/19 198 lb (89.8 kg)  04/12/19 205 lb (93 kg)    Physical Exam Vitals  and nursing note reviewed.  Constitutional:      General: She is not in acute distress.    Appearance: Normal appearance. She is obese. She is not toxic-appearing.  HENT:     Head: Normocephalic and atraumatic.  Eyes:     General: No scleral icterus.    Extraocular Movements: Extraocular movements intact.  Cardiovascular:     Rate and Rhythm: Normal rate.  Abdominal:     General: Abdomen is flat. There is no distension.     Palpations: Abdomen is soft.   Genitourinary:    Comments: Deferred using shared decision-making Musculoskeletal:     Cervical back: Normal range of motion.  Skin:    Coloration: Skin is not jaundiced or pale.     Findings: No erythema.  Neurological:     Mental Status: She is alert and oriented to person, place, and time.     Motor: No weakness.     Gait: Gait normal.  Psychiatric:        Mood and Affect: Mood normal.        Behavior: Behavior normal.        Thought Content: Thought content normal.        Judgment: Judgment normal.       Assessment & Plan:   Problem List Items Addressed This Visit      Other   Vitamin D deficiency    Reports a low vitamin D level in the past.  We will check vitamin D level today and treat as indicated.  Follow-up in 6 months.      Relevant Orders   VITAMIN D 25 Hydroxy (Vit-D Deficiency, Fractures) (Completed)   Urinary incontinence    Acute.  Not associated with urge incontinence.  Will check UA today to rule out acute infection.  In meantime, can start on oxybutynin for potential bladder spasms.  If this does not help, consider pelvic floor physical therapy.  Follow-up as needed.      Relevant Medications   oxybutynin (DITROPAN-XL) 5 MG 24 hr tablet   Other Relevant Orders   Urinalysis, Routine w reflex microscopic (Completed)   Prediabetes - Primary    Chronic.  Last A1c in March 2021 was 5.7% which is in prediabetes range.  Patient has been working on increasing physical activity as well as eating healthier.  We will recheck A1c today.  With also desire for weight loss, will start on Trulicity 0.75 mg weekly.  Samples will be given and we will follow up in 1 week.  If tolerates well, can also consider Ozempic if if cost is an issue with Trulicity.      Relevant Medications   Dulaglutide (TRULICITY) 0.75 MG/0.5ML SOPN   Other Relevant Orders   COMPLETE METABOLIC PANEL WITH GFR (Completed)   Lipid panel (Completed)   Hemoglobin A1c (Completed)   BMI  36.0-36.9,adult    Chronic.  Patient also has prediabetes.  We are checking A1c today along with cholesterol and electrolytes, kidney function, and liver enzymes.  Patient has been working on weight loss for years.  Was previously on phentermine.  Discussed other weight loss options including Saxenda and Ozempic.  Since we have samples of Trulicity, will trial Trulicity and if tolerates well, can continue or we can consider Ozempic pending affordability/insurance coverage.  Follow-up in 1 week, then 1 month for weight loss.         Follow up plan: Return in about 1 year (around 04/17/2021) for Follow-up nurse visit then 1  week follow-up with me.Marland Kitchen

## 2020-04-17 NOTE — Patient Instructions (Signed)
1 week nurse visit to pick up sample of Trulicity  1 month f/u

## 2020-04-18 DIAGNOSIS — Z6834 Body mass index (BMI) 34.0-34.9, adult: Secondary | ICD-10-CM | POA: Insufficient documentation

## 2020-04-18 DIAGNOSIS — Z6836 Body mass index (BMI) 36.0-36.9, adult: Secondary | ICD-10-CM | POA: Insufficient documentation

## 2020-04-18 DIAGNOSIS — R32 Unspecified urinary incontinence: Secondary | ICD-10-CM | POA: Insufficient documentation

## 2020-04-18 LAB — COMPLETE METABOLIC PANEL WITH GFR
AG Ratio: 1.3 (calc) (ref 1.0–2.5)
ALT: 16 U/L (ref 6–29)
AST: 19 U/L (ref 10–30)
Albumin: 4 g/dL (ref 3.6–5.1)
Alkaline phosphatase (APISO): 74 U/L (ref 31–125)
BUN: 9 mg/dL (ref 7–25)
CO2: 21 mmol/L (ref 20–32)
Calcium: 9.2 mg/dL (ref 8.6–10.2)
Chloride: 106 mmol/L (ref 98–110)
Creat: 0.65 mg/dL (ref 0.50–1.10)
GFR, Est African American: 127 mL/min/{1.73_m2} (ref >=60)
GFR, Est Non African American: 110 mL/min/{1.73_m2} (ref >=60)
Globulin: 3 g/dL (calc) (ref 1.9–3.7)
Glucose, Bld: 86 mg/dL (ref 65–99)
Potassium: 4 mmol/L (ref 3.5–5.3)
Sodium: 138 mmol/L (ref 135–146)
Total Bilirubin: 0.4 mg/dL (ref 0.2–1.2)
Total Protein: 7 g/dL (ref 6.1–8.1)

## 2020-04-18 LAB — VITAMIN D 25 HYDROXY (VIT D DEFICIENCY, FRACTURES): Vit D, 25-Hydroxy: 14 ng/mL — ABNORMAL LOW (ref 30–100)

## 2020-04-18 LAB — HEMOGLOBIN A1C
Hgb A1c MFr Bld: 6.1 % of total Hgb — ABNORMAL HIGH (ref <5.7)
Mean Plasma Glucose: 128 mg/dL
eAG (mmol/L): 7.1 mmol/L

## 2020-04-18 LAB — LIPID PANEL
Cholesterol: 186 mg/dL (ref <200)
HDL: 57 mg/dL (ref >=50)
LDL Cholesterol (Calc): 115 mg/dL (calc) — ABNORMAL HIGH
Non-HDL Cholesterol (Calc): 129 mg/dL (calc) (ref <130)
Total CHOL/HDL Ratio: 3.3 (calc) (ref <5.0)
Triglycerides: 58 mg/dL (ref <150)

## 2020-04-18 LAB — URINALYSIS, ROUTINE W REFLEX MICROSCOPIC
Bilirubin Urine: NEGATIVE
Glucose, UA: NEGATIVE
Hgb urine dipstick: NEGATIVE
Ketones, ur: NEGATIVE
Leukocytes,Ua: NEGATIVE
Nitrite: NEGATIVE
Protein, ur: NEGATIVE
Specific Gravity, Urine: 1.023 (ref 1.001–1.03)
pH: <=5 (ref 5.0–8.0)

## 2020-04-18 MED ORDER — TRULICITY 0.75 MG/0.5ML ~~LOC~~ SOAJ: 0.7500 mg | SUBCUTANEOUS | 2 refills | Status: DC

## 2020-04-18 MED ORDER — OXYBUTYNIN CHLORIDE ER 5 MG PO TB24: 5.0000 mg | ORAL_TABLET | Freq: Every day | ORAL | 1 refills | Status: DC

## 2020-04-18 NOTE — Assessment & Plan Note (Signed)
Reports a low vitamin D level in the past.  We will check vitamin D level today and treat as indicated.  Follow-up in 6 months.

## 2020-04-18 NOTE — Assessment & Plan Note (Addendum)
Chronic.  Patient also has prediabetes.  We are checking A1c today along with cholesterol and electrolytes, kidney function, and liver enzymes.  Patient has been working on weight loss for years.  Was previously on phentermine.  Discussed other weight loss options including Saxenda and Ozempic.  Since we have samples of Trulicity, will trial Trulicity and if tolerates well, can continue or we can consider Ozempic pending affordability/insurance coverage.  Follow-up in 1 week, then 1 month for weight loss.

## 2020-04-18 NOTE — Assessment & Plan Note (Signed)
Acute.  Not associated with urge incontinence.  Will check UA today to rule out acute infection.  In meantime, can start on oxybutynin for potential bladder spasms.  If this does not help, consider pelvic floor physical therapy.  Follow-up as needed.

## 2020-04-18 NOTE — Assessment & Plan Note (Signed)
Chronic.  Last A1c in March 2021 was 5.7% which is in prediabetes range.  Patient has been working on increasing physical activity as well as eating healthier.  We will recheck A1c today.  With also desire for weight loss, will start on Trulicity 0.75 mg weekly.  Samples will be given and we will follow up in 1 week.  If tolerates well, can also consider Ozempic if if cost is an issue with Trulicity.

## 2020-04-23 ENCOUNTER — Other Ambulatory Visit: Payer: Self-pay

## 2020-04-23 ENCOUNTER — Ambulatory Visit (INDEPENDENT_AMBULATORY_CARE_PROVIDER_SITE_OTHER): Payer: BC Managed Care – PPO | Admitting: Nurse Practitioner

## 2020-04-23 DIAGNOSIS — E669 Obesity, unspecified: Secondary | ICD-10-CM | POA: Diagnosis not present

## 2020-04-23 NOTE — Progress Notes (Signed)
Pt came to tutorial on how to administer trulicity for weight loss. Pt given 2 boxes of the free samples. Pt did not tolerate well. Pt given shot in right arm, moved away from needle causing some void of the medication. Pt says she will try to continue wkly injections on her own, however may want to seek other form of weightloss.

## 2020-05-15 NOTE — Progress Notes (Signed)
40 y.o. G1P1 Single African American female here for annual exam.    Wants a pap today.   Has had a partner since her last visit.  Desires STD screening.   Having urinary incontinence, which started recently.    Started on Ditropan 5 mg.  Sometimes leaks with cough, laugh, sneeze, run, jump if she needs to void.  Does not occur every time.  Finds her underwear wet when she goes to the restroom.  No urgency or frequency.  DF - twice a day.  NF - not up at night.   No hx UTIs or renal stones.   Does not feel prolapse.   No bowel control issues.  No constipation issues ongoing or stool leakage.   One vaginal delivery 5 pounds, 9.5 ounces.   When she eats fish or seafood, she has a vaginal odor.  Otherwise, she does not have vaginal odor.   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   Received her Covid vaccine.  No booster.    PCP:  Cathlean Marseilles   Patient's last menstrual period was 05/08/2020 (exact date).     Period Cycle (Days): 30 Period Duration (Days): 5 Period Pattern: Regular Menstrual Flow: Moderate Menstrual Control: Maxi pad Menstrual Control Change Freq (Hours): changes pad every 4 hours on heaviest day Dysmenorrhea:  (has cramping prior to menses)     Sexually active: No.  The current method of family planning is Abstinence.    Exercising: Yes.    walks a brisk wallk 30 min/day Smoker:  no  Health Maintenance: Pap:  03-21-19 Neg:Neg HR HPV, 03/19/18 Neg:Neg HR HPV, 01-22-17 Neg:Pos HR HPV History of abnormal Pap:  Yes, 02-19-17 colpo revealed CIN 1 and Neg ECC;01-22-17 Neg:Pos HR HPV. 01-09-15 colpo revealed Neg ECC;12-28-14 ASCUS:Pos HR HPV. LEEP 2011. CIN 1 MMG: 09-26-19 3D/Poss.Lt.axilla adenopathy;Rt.Br.Neg. Diag.Bil.w/Lt.US//neg/BiRads1/screening 89yr. Colonoscopy:  n/a BMD:   n/a  Result  n/a TDaP: 08-29-12 Gardasil:   no HIV:neg in past Hep C:neg in past Screening Labs: PCP   reports that she has never smoked. She has never used smokeless  tobacco. She reports current alcohol use. She reports that she does not use drugs.  Past Medical History:  Diagnosis Date  . Abnormal Pap smear   . Anemia   . Pilonidal cyst   . Prediabetes   . Urinary incontinence     Past Surgical History:  Procedure Laterality Date  . LEEP     CIN II    Current Outpatient Medications  Medication Sig Dispense Refill  . Dulaglutide (TRULICITY) 0.75 MG/0.5ML SOPN Inject 0.75 mg into the skin once a week. 6 mL 2  . oxybutynin (DITROPAN-XL) 5 MG 24 hr tablet Take 1 tablet (5 mg total) by mouth at bedtime. 30 tablet 1   No current facility-administered medications for this visit.    Family History  Problem Relation Age of Onset  . Lung cancer Paternal Grandmother        smoker  . Hypertension Paternal Grandmother   . Hypothyroidism Paternal Grandmother   . Congestive Heart Failure Father        had been on dialysis x 10 years  . Diabetes Father   . High Cholesterol Father   . Heart disease Father   . Kidney disease Father   . Cancer Father   . Obesity Father   . Hypertension Paternal Grandfather   . Kidney failure Paternal Grandfather   . Stroke Maternal Grandmother   . Depression Mother   . Sleep apnea Mother   .  Alcoholism Mother     Review of Systems  Genitourinary:       Urinary incontinence--worse with coughing, laughing, sneezing  All other systems reviewed and are negative.   Exam:   BP 110/70 (Cuff Size: Large)   Ht 5\' 4"  (1.626 m)   Wt 203 lb (92.1 kg)   LMP 05/08/2020 (Exact Date)   SpO2 100%   BMI 34.84 kg/m     General appearance: alert, cooperative and appears stated age Head: normocephalic, without obvious abnormality, atraumatic Neck: no adenopathy, supple, symmetrical, trachea midline and thyroid normal to inspection and palpation Lungs: clear to auscultation bilaterally Breasts: normal appearance, no masses or tenderness, No nipple retraction or dimpling, No nipple discharge or bleeding, No axillary  adenopathy Heart: regular rate and rhythm Abdomen: soft, non-tender; no masses, no organomegaly Extremities: extremities normal, atraumatic, no cyanosis or edema Skin: skin color, texture, turgor normal. No rashes or lesions Lymph nodes: cervical, supraclavicular, and axillary nodes normal. Neurologic: grossly normal  Pelvic: External genitalia:  no lesions              No abnormal inguinal nodes palpated.              Urethra:  normal appearing urethra with no masses, tenderness or lesions              Bartholins and Skenes: normal                 Vagina: normal appearing vagina with normal color and discharge, no lesions              Cervix: no lesions. Small amount of vaginal blood.               Pap taken: Yes.   Bimanual Exam:  Uterus:  normal size, contour, position, consistency, mobility, non-tender.  Exam limited due to [facility] and involuntary contraction of abdominal wall.               Adnexa: no mass, fullness, tenderness              Rectal exam: Yes.  .  Confirms.              Anus:  normal sphincter tone, no lesions  Chaperone was present for exam.  Assessment:   Well woman visit with normal exam. Hx recurrent cervical dysplasia. HX CIN II, status post LEEP.  Elevated A1C.  Mixed incontinence.  On Ditropan XL.  Plan: Mammogram screening discussed. Self breast awareness reviewed. Pap and HR HPV as above.  She understands that her insurance company may not pay for her cervical cancer screening today.  Guidelines for Calcium, Vitamin D, regular exercise program including cardiovascular and weight bearing exercise. We discussed mixed incontinence.  Options of medication, pelvic floor therapy, reduction of bladder irritants reviewed, surgery mentioned.    Good blood sugar control encouraged. She is currently under care with her PCP.  STD testing.   Follow up annually and prn.

## 2020-05-17 ENCOUNTER — Ambulatory Visit (INDEPENDENT_AMBULATORY_CARE_PROVIDER_SITE_OTHER): Payer: BC Managed Care – PPO | Admitting: Obstetrics and Gynecology

## 2020-05-17 ENCOUNTER — Encounter: Payer: Self-pay | Admitting: Obstetrics and Gynecology

## 2020-05-17 ENCOUNTER — Other Ambulatory Visit: Payer: Self-pay

## 2020-05-17 ENCOUNTER — Other Ambulatory Visit (HOSPITAL_COMMUNITY)
Admission: RE | Admit: 2020-05-17 | Discharge: 2020-05-17 | Disposition: A | Discharge status: Home / Self Care | Payer: BC Managed Care – PPO | Source: Ambulatory Visit | Attending: Obstetrics and Gynecology | Admitting: Obstetrics and Gynecology

## 2020-05-17 VITALS — BP 110/70 | Ht 64.0 in | Wt 203.0 lb

## 2020-05-17 DIAGNOSIS — Z113 Encounter for screening for infections with a predominantly sexual mode of transmission: Secondary | ICD-10-CM | POA: Diagnosis present

## 2020-05-17 DIAGNOSIS — Z01419 Encounter for gynecological examination (general) (routine) without abnormal findings: Secondary | ICD-10-CM

## 2020-05-17 DIAGNOSIS — Z87410 Personal history of cervical dysplasia: Secondary | ICD-10-CM

## 2020-05-17 DIAGNOSIS — N3946 Mixed incontinence: Secondary | ICD-10-CM

## 2020-05-17 NOTE — Patient Instructions (Addendum)
EXERCISE AND DIET:  We recommended that you start or continue a regular exercise program for good health. Regular exercise means any activity that makes your heart beat faster and makes you sweat.  We recommend exercising at least 30 minutes per day at least 3 days a week, preferably 4 or 5.  We also recommend a diet low in fat and sugar.  Inactivity, poor dietary choices and obesity can cause diabetes, heart attack, stroke, and kidney damage, among others.    ALCOHOL AND SMOKING:  Women should limit their alcohol intake to no more than 7 drinks/beers/glasses of wine (combined, not each!) per week. Moderation of alcohol intake to this level decreases your risk of breast cancer and liver damage. And of course, no recreational drugs are part of a healthy lifestyle.  And absolutely no smoking or even second hand smoke. Most people know smoking can cause heart and lung diseases, but did you know it also contributes to weakening of your bones? Aging of your skin?  Yellowing of your teeth and nails?  CALCIUM AND VITAMIN D:  Adequate intake of calcium and Vitamin D are recommended.  The recommendations for exact amounts of these supplements seem to change often, but generally speaking 600 mg of calcium (either carbonate or citrate) and 800 units of Vitamin D per day seems prudent. Certain women may benefit from higher intake of Vitamin D.  If you are among these women, your doctor will have told you during your visit.    PAP SMEARS:  Pap smears, to check for cervical cancer or precancers,  have traditionally been done yearly, although recent scientific advances have shown that most women can have pap smears less often.  However, every woman still should have a physical exam from her gynecologist every year. It will include a breast check, inspection of the vulva and vagina to check for abnormal growths or skin changes, a visual exam of the cervix, and then an exam to evaluate the size and shape of the uterus and  ovaries.  And after 42 years of age, a rectal exam is indicated to check for rectal cancers. We will also provide age appropriate advice regarding health maintenance, like when you should have certain vaccines, screening for sexually transmitted diseases, bone density testing, colonoscopy, mammograms, etc.   MAMMOGRAMS:  All women over 40 years old should have a yearly mammogram. Many facilities now offer a "3D" mammogram, which may cost around $50 extra out of pocket. If possible,  we recommend you accept the option to have the 3D mammogram performed.  It both reduces the number of women who will be called back for extra views which then turn out to be normal, and it is better than the routine mammogram at detecting truly abnormal areas.    COLONOSCOPY:  Colonoscopy to screen for colon cancer is recommended for all women at age 50.  We know, you hate the idea of the prep.  We agree, BUT, having colon cancer and not knowing it is worse!!  Colon cancer so often starts as a polyp that can be seen and removed at colonscopy, which can quite literally save your life!  And if your first colonoscopy is normal and you have no family history of colon cancer, most women don't have to have it again for 10 years.  Once every ten years, you can do something that may end up saving your life, right?  We will be happy to help you get it scheduled when you are ready.    Be sure to check your insurance coverage so you understand how much it will cost.  It may be covered as a preventative service at no cost, but you should check your particular policy.      Urinary Incontinence  Urinary incontinence refers to a condition in which a person is unable to control where and when to pass urine. A person with this condition will urinate when he or she does not mean to (involuntarily). What are the causes? This condition may be caused by:  Medicines.  Infections.  Constipation.  Overactive bladder muscles.  Weak bladder  muscles.  Weak pelvic floor muscles. These muscles provide support for the bladder, intestine, and, in women, the uterus.  Enlarged prostate in men. The prostate is a gland near the bladder. When it gets too big, it can pinch the urethra. With the urethra blocked, the bladder can weaken and lose the ability to empty properly.  Surgery.  Emotional factors, such as anxiety, stress, or post-traumatic stress disorder (PTSD).  Pelvic organ prolapse. This happens in women when organs shift out of place and into the vagina. This shift can prevent the bladder and urethra from working properly. What increases the risk? The following factors may make you more likely to develop this condition:  Older age.  Obesity and physical inactivity.  Pregnancy and childbirth.  Menopause.  Diseases that affect the nerves or spinal cord (neurological diseases).  Long-term (chronic) coughing. This can increase pressure on the bladder and pelvic floor muscles. What are the signs or symptoms? Symptoms may vary depending on the type of urinary incontinence you have. They include:  A sudden urge to urinate, but passing urine involuntarily before you can get to a bathroom (urge incontinence).  Suddenly passing urine with any activity that forces urine to pass, such as coughing, laughing, exercise, or sneezing (stress incontinence).  Needing to urinate often, but urinating only a small amount, or constantly dribbling urine (overflow incontinence).  Urinating because you cannot get to the bathroom in time due to a physical disability, such as arthritis or injury, or communication and thinking problems, such as Alzheimer disease (functional incontinence). How is this diagnosed? This condition may be diagnosed based on:  Your medical history.  A physical exam.  Tests, such as: ? Urine tests. ? X-rays of your kidney and bladder. ? Ultrasound. ? CT scan. ? Cystoscopy. In this procedure, a health care  provider inserts a tube with a light and camera (cystoscope) through the urethra and into the bladder in order to check for problems. ? Urodynamic testing. These tests assess how well the bladder, urethra, and sphincter can store and release urine. There are different types of urodynamic tests, and they vary depending on what the test is measuring. To help diagnose your condition, your health care provider may recommend that you keep a log of when you urinate and how much you urinate. How is this treated? Treatment for this condition depends on the type of incontinence that you have and its cause. Treatment may include:  Lifestyle changes, such as: ? Quitting smoking. ? Maintaining a healthy weight. ? Staying active. Try to get 150 minutes of moderate-intensity exercise every week. Ask your health care provider which activities are safe for you. ? Eating a healthy diet.  Avoid high-fat foods, like fried foods.  Avoid refined carbohydrates like white bread and white rice.  Limit how much alcohol and caffeine you drink.  Increase your fiber intake. Foods such as fresh fruits, vegetables, beans,   and whole grains are healthy sources of fiber.  Pelvic floor muscle exercises.  Bladder training, such as lengthening the amount of time between bathroom breaks, or using the bathroom at regular intervals.  Using techniques to suppress bladder urges. This can include distraction techniques or controlled breathing exercises.  Medicines to relax the bladder muscles and prevent bladder spasms.  Medicines to help slow or prevent the growth of a man's prostate.  Botox injections. These can help relax the bladder muscles.  Using pulses of electricity to help change bladder reflexes (electrical nerve stimulation).  For women, using a medical device to prevent urine leaks. This is a small, tampon-like, disposable device that is inserted into the urethra.  Injecting collagen or carbon beads (bulking  agents) into the urinary sphincter. These can help thicken tissue and close the bladder opening.  Surgery. Follow these instructions at home: Lifestyle  Limit alcohol and caffeine. These can fill your bladder quickly and irritate it.  Keep yourself clean to help prevent odors and skin damage. Ask your doctor about special skin creams and cleansers that can protect the skin from urine.  Consider wearing pads or adult diapers. Make sure to change them regularly, and always change them right after experiencing incontinence. General instructions  Take over-the-counter and prescription medicines only as told by your health care provider.  Use the bathroom about every 3-4 hours, even if you do not feel the need to urinate. Try to empty your bladder completely every time. After urinating, wait a minute. Then try to urinate again.  Make sure you are in a relaxed position while urinating.  If your incontinence is caused by nerve problems, keep a log of the medicines you take and the times you go to the bathroom.  Keep all follow-up visits as told by your health care provider. This is important. Contact a health care provider if:  You have pain that gets worse.  Your incontinence gets worse. Get help right away if:  You have a fever or chills.  You are unable to urinate.  You have redness in your groin area or down your legs. Summary  Urinary incontinence refers to a condition in which a person is unable to control where and when to pass urine.  This condition may be caused by medicines, infection, weak bladder muscles, weak pelvic floor muscles, enlargement of the prostate (in men), or surgery.  The following factors increase your risk for developing this condition: older age, obesity, pregnancy and childbirth, menopause, neurological diseases, and chronic coughing.  There are several types of urinary incontinence. They include urge incontinence, stress incontinence, overflow  incontinence, and functional incontinence.  This condition is usually treated first with lifestyle and behavioral changes, such as quitting smoking, eating a healthier diet, and doing regular pelvic floor exercises. Other treatment options include medicines, bulking agents, medical devices, electrical nerve stimulation, or surgery. This information is not intended to replace advice given to you by your health care provider. Make sure you discuss any questions you have with your health care provider. Document Revised: 01/02/2017 Document Reviewed: 04/03/2016 Elsevier Patient Education  2021 [employer] Inc.  

## 2020-05-18 ENCOUNTER — Encounter: Payer: Self-pay | Admitting: Nurse Practitioner

## 2020-05-18 ENCOUNTER — Ambulatory Visit: Payer: BC Managed Care – PPO | Admitting: Nurse Practitioner

## 2020-05-18 VITALS — BP 132/82 | HR 84 | Temp 97.9°F | Ht 64.0 in | Wt 203.8 lb

## 2020-05-18 DIAGNOSIS — R32 Unspecified urinary incontinence: Secondary | ICD-10-CM | POA: Diagnosis not present

## 2020-05-18 DIAGNOSIS — Z6834 Body mass index (BMI) 34.0-34.9, adult: Secondary | ICD-10-CM

## 2020-05-18 LAB — CYTOLOGY - PAP
Chlamydia: NEGATIVE
Comment: NEGATIVE
Comment: NEGATIVE
Comment: NEGATIVE
Comment: NORMAL
Diagnosis: NEGATIVE
High risk HPV: NEGATIVE
Neisseria Gonorrhea: NEGATIVE
Trichomonas: NEGATIVE

## 2020-05-18 LAB — HEPATITIS C ANTIBODY
Hepatitis C Ab: NONREACTIVE
SIGNAL TO CUT-OFF: 0.01 (ref <1.00)

## 2020-05-18 LAB — RPR: RPR Ser Ql: NONREACTIVE

## 2020-05-18 LAB — HEPATITIS B SURFACE ANTIGEN: Hepatitis B Surface Ag: NONREACTIVE

## 2020-05-18 LAB — HIV ANTIBODY (ROUTINE TESTING W REFLEX): HIV 1&2 Ab, 4th Generation: NONREACTIVE

## 2020-05-18 NOTE — Assessment & Plan Note (Signed)
Acute.  Has not noticed difference with oxybutynin.  Will place referral for pelvic floor physical therapy today.  Follow up as needed.

## 2020-05-18 NOTE — Progress Notes (Signed)
Subjective:    Patient ID: Katie Curtis, female    DOB: August 16, 1980, 40 y.o.   MRN: [id number]  HPI: Katie Curtis is a 40 y.o. female presenting for medication follow up.  Chief Complaint  Patient presents with  . Obesity   BMI 34 Taking Trulicity 0.75 mg weekly and tolerating this well. Duration: years Previous attempts at weight loss: yes Complications of obesity: prediabetes Peak weight: 213 Weight loss goal:  170 Weight loss to date: 10 pounds Requesting obesity pharmacotherapy: currently taking and tolerating Dulaglutide well Current weight loss supplements/medications: yes Previous weight loss supplements/meds: no Dietary changes: limiting carbohyrations; drinks coffee with cream and sugar/splenda for breakfast.  Veggies and fruit for lunch.  Dinner - sometimes chipotle bowl. Physical activity: Is trying to walk 30 minutes 3 time weekly.  She is planning on increasing on this over the next couple of weeks.  Urinary incontinence - cannot tell if the ditropan is making much of a difference.  She is interested in urinary physical therapy.  Outpatient Encounter Medications as of 05/18/2020  Medication Sig  . Dulaglutide (TRULICITY) 0.75 MG/0.5ML SOPN Inject 0.75 mg into the skin once a week.  Marland Kitchen oxybutynin (DITROPAN-XL) 5 MG 24 hr tablet Take 1 tablet (5 mg total) by mouth at bedtime.   No facility-administered encounter medications on file as of 05/18/2020.    Patient Active Problem List   Diagnosis Date Noted  . Urinary incontinence 04/18/2020  . BMI 34.0-34.9,adult 04/18/2020  . Prediabetes 03/21/2019  . Vitamin D deficiency 01/18/2017  . 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 Abnormal Pap smear   . Anemia   . Pilonidal cyst   . Prediabetes   . Urinary incontinence     Relevant past medical, surgical, family and social history reviewed and updated as indicated. Interim medical history since our  last visit reviewed.  Review of Systems Per HPI unless specifically indicated above     Objective:    BP 132/82 (BP Location: Left Arm, Patient Position: Sitting)   Pulse 84   Temp 97.9 F (36.6 C) (Temporal)   Ht 5\' 4"  (1.626 m)   Wt 203 lb 12.8 oz (92.4 kg)   LMP 05/08/2020 (Exact Date)   SpO2 98%   BMI 34.98 kg/m   Wt Readings from Last 3 Encounters:  05/18/20 203 lb 12.8 oz (92.4 kg)  05/17/20 203 lb (92.1 kg)  04/17/20 208 lb 3.2 oz (94.4 kg)    Physical Exam Vitals and nursing note reviewed.  Constitutional:      General: She is not in acute distress.    Appearance: Normal appearance. She is not toxic-appearing.  Eyes:     General: No scleral icterus.    Extraocular Movements: Extraocular movements intact.  Cardiovascular:     Rate and Rhythm: Normal rate and regular rhythm.     Heart sounds: Normal heart sounds. No murmur heard.   Pulmonary:     Effort: Pulmonary effort is normal. No respiratory distress.     Breath sounds: Normal breath sounds. No wheezing, rhonchi or rales.  Abdominal:     General: Abdomen is flat. Bowel sounds are normal.     Palpations: Abdomen is soft.     Tenderness: There is no right CVA tenderness or left CVA tenderness.  Skin:    General: Skin is warm and dry.     Coloration: Skin is not jaundiced or pale.  Findings: No erythema.  Neurological:     Mental Status: She is alert and oriented to person, place, and time.  Psychiatric:        Mood and Affect: Mood normal.        Thought Content: Thought content normal.        Judgment: Judgment normal.       Assessment & Plan:   Problem List Items Addressed This Visit      Other   Urinary incontinence    Acute.  Has not noticed difference with oxybutynin.  Will place referral for pelvic floor physical therapy today.  Follow up as needed.      Relevant Orders   Ambulatory referral to Physical Therapy   BMI 34.0-34.9,adult - Primary    With prediabetes; last A1c 6.1%.    Tolerating Dulaglutide 0.75 mg weekly well. Will continue at this dose for now and recheck kidney function.  Discussed dietary changes and increasing physical activity - goal is 30 minutes 5 times weekly and less than 130 carbohydrates per day.  Follow up in 4 weeks.       Relevant Orders   BASIC METABOLIC PANEL WITH GFR       Follow up plan: Return in about 4 weeks (around 06/15/2020) for weight loss.

## 2020-05-18 NOTE — Assessment & Plan Note (Signed)
With prediabetes; last A1c 6.1%.   Tolerating Dulaglutide 0.75 mg weekly well. Will continue at this dose for now and recheck kidney function.  Discussed dietary changes and increasing physical activity - goal is 30 minutes 5 times weekly and less than 130 carbohydrates per day.  Follow up in 4 weeks.

## 2020-05-19 LAB — BASIC METABOLIC PANEL WITH GFR
BUN: 11 mg/dL (ref 7–25)
CO2: 26 mmol/L (ref 20–32)
Calcium: 9.8 mg/dL (ref 8.6–10.2)
Chloride: 106 mmol/L (ref 98–110)
Creat: 0.82 mg/dL (ref 0.50–1.10)
GFR, Est African American: 102 mL/min/{1.73_m2} (ref >=60)
GFR, Est Non African American: 88 mL/min/{1.73_m2} (ref >=60)
Glucose, Bld: 84 mg/dL (ref 65–99)
Potassium: 4.2 mmol/L (ref 3.5–5.3)
Sodium: 140 mmol/L (ref 135–146)

## 2020-06-12 ENCOUNTER — Ambulatory Visit (HOSPITAL_COMMUNITY): Payer: BC Managed Care – PPO | Attending: Nurse Practitioner | Admitting: Physical Therapy

## 2020-06-12 ENCOUNTER — Other Ambulatory Visit: Payer: Self-pay

## 2020-06-12 DIAGNOSIS — N3946 Mixed incontinence: Secondary | ICD-10-CM | POA: Diagnosis present

## 2020-06-12 NOTE — Therapy (Signed)
[facility] [address] [place], [place], [address] Phone: [phone]   Fax:  [phone]  Physical Therapy Evaluation  Patient Details  Name: Katie Curtis MRN: [id number] Date of Birth: Aug 04, 1980 Referring Provider (PT): Cathlean Marseilles   Encounter Date: 06/12/2020   PT End of Session - 06/12/20 1609    Visit Number 1    Number of Visits 4    Date for PT Re-Evaluation 07/12/20    Authorization Type BCBS    Progress Note Due on Visit 4    PT Start Time 1530    PT Stop Time 1605    PT Time Calculation (min) 35 min    Activity Tolerance Patient tolerated treatment well    Behavior During Therapy [facility] for tasks assessed/performed           Past Medical History:  Diagnosis Date  . Abnormal Pap smear   . Anemia   . Pilonidal cyst   . Prediabetes   . Urinary incontinence     Past Surgical History:  Procedure Laterality Date  . LEEP     CIN II    There were no vitals filed for this visit.    Subjective Assessment - 06/12/20 1616    Subjective Katie Curtis states that she has been noticing since February that by the evening time her panties are moist.  She is not aware of leaking therefore it is difficult to say if she is dripping prior to making it to the commode or if she is having issues with activities.  Most the time she is not leaking enough to warrent wearing a pad.    Currently in Pain? No/denies              [facility] PT Assessment - 06/12/20 0001      Assessment   Medical Diagnosis Urinary incontinence    Referring Provider (PT) Cathlean Marseilles    Next MD Visit not scheduled    Prior Therapy none      Precautions   Precautions None      Cognition   Overall Cognitive Status Within Functional Limits for tasks assessed                 Objective measurements completed on examination: See above findings.     Pelvic Floor Special Questions - 06/12/20 0001    Prior Urinalysis Yes    Date of  last Urinalysis --   April   Prior Pregnancies Yes    Number of Pregnancies 1    Number of Vaginal Deliveries 1    Urinary Leakage Yes    How often --   leaks very little she is not sure how many times she leaks.   Pad use --   normally does not need to put a pad on by the end of the day her underwear will be damp.   Activities that cause leaking Coughing;Laughing;Sneezing;Exercising    Urinary urgency Yes   95% of the time she can make it to the commode.           [facility] Adult PT Treatment/Exercise - 06/12/20 0001      Exercises   Exercises Lumbar      Lumbar Exercises: Seated   Other Seated Lumbar Exercises kegal quick flicks 2/4x 10; long hold 10/20 x 4    Other Seated Lumbar Exercises transverse abdominal x 10  PT Education - 06/12/20 1609    Education Details hep    Person(s) Educated Patient    Methods Explanation;Handout    Comprehension Verbalized understanding;Returned demonstration            PT Short Term Goals - 06/12/20 1612      PT SHORT TERM GOAL #1   Title PT to be I in HEP to allow 100% continence throughout the day    Time 4    Period Weeks    Status New    Target Date 07/10/20                     Plan - 06/12/20 1610    Clinical Impression Statement Katie Curtis is a 40 yo female who has just recently noted increased incontinence.  She requested treatment before it got to bad.  At this time Katie Curtis is experiencing both urge and stress incontinence.  She will benefit from skilled PT to address her weakness in her core and pelvic mm to eliminate her incontinence.    Examination-Activity Limitations Dressing    Examination-Participation Restrictions Other    Stability/Clinical Decision Making Stable/Uncomplicated    Clinical Decision Making Low    Rehab Potential Good    PT Frequency 1x / week    PT Duration 4 weeks    PT Treatment/Interventions Patient/family education;Therapeutic exercise    PT Next Visit Plan  Increase long hold times, begin heel slides, hip abduction and all four hip extensionn    PT Home Exercise Plan kegal 2/4 x 10; long hold 10 second hold/20 rest x 4.           Patient will benefit from skilled therapeutic intervention in order to improve the following deficits and impairments:  Decreased strength  Visit Diagnosis: Mixed incontinence     Problem List Patient Active Problem List   Diagnosis Date Noted  . Urinary incontinence 04/18/2020  . BMI 34.0-34.9,adult 04/18/2020  . Prediabetes 03/21/2019  . Vitamin D deficiency 01/18/2017  . ASCUS with positive high risk HPV cervical 01/09/2015    Katie Curtis, PT CLT [phone] 06/12/2020, 4:18 PM  [facility] [address] [place], [place], [address] Phone: [phone]   Fax:  [phone]  Name: Katie Curtis MRN: [id number] Date of Birth: 08-Mar-1980

## 2020-06-15 ENCOUNTER — Other Ambulatory Visit: Payer: Self-pay

## 2020-06-15 ENCOUNTER — Encounter: Payer: Self-pay | Admitting: Obstetrics and Gynecology

## 2020-06-15 ENCOUNTER — Ambulatory Visit: Payer: BC Managed Care – PPO | Admitting: Obstetrics and Gynecology

## 2020-06-15 VITALS — BP 130/78 | HR 88 | Ht 64.0 in | Wt 208.0 lb

## 2020-06-15 DIAGNOSIS — Z789 Other specified health status: Secondary | ICD-10-CM

## 2020-06-15 DIAGNOSIS — Z3009 Encounter for other general counseling and advice on contraception: Secondary | ICD-10-CM | POA: Diagnosis not present

## 2020-06-15 LAB — PREGNANCY, URINE: Preg Test, Ur: NEGATIVE

## 2020-06-15 MED ORDER — NORETHIN ACE-ETH ESTRAD-FE 1-20 MG-MCG PO TABS
1.0000 | ORAL_TABLET | Freq: Every day | ORAL | 0 refills | Status: DC
Start: 2020-06-15 — End: 2020-09-04

## 2020-06-15 NOTE — Patient Instructions (Signed)
Oral Contraception Information Oral contraceptive pills (OCPs) are medicines taken by mouth to prevent pregnancy. They work by: Preventing the ovaries from releasing eggs. Thickening mucus in the lower part of the uterus (cervix). This prevents sperm from entering the uterus. Thinning the lining of the uterus (endometrium). This prevents a fertilized egg from attaching to the endometrium. OCPs are highly effective when taken exactly as prescribed. However, OCPs do not prevent STIs (sexually transmitted infections). Using condoms while on an OCP can help prevent STIs. What happens before starting OCPs? Before you start taking OCPs: You may have a physical exam, blood test, and Pap test. Your health care provider will make sure you are a good candidate for oral contraception. OCPs are not a good option for certain women, such as: Women who smoke and are older than age 35. Women who have or have had certain conditions, such as: A history of high blood pressure. Deep vein thrombosis. Pulmonary embolism. Stroke. Cardiovascular disease. Peripheral vascular disease. Ask your health care provider about the possible side effects of the OCP you may be prescribed. Be aware that it can take 2-3 months for your body to adjustto changes in hormone levels. Types of oral contraception  Birth control pills contain the hormones estrogen and progestin (synthetic progesterone) or progestin only. The combination pill This type of pill contains estrogen and progestin hormones. Conventional contraception pills come in packs of 21 or 28 pills. Some packs with 28-day pills contain estrogen and progestin for the first 21-24 days. Hormone-free tablets, called placebos, are taken for the final 4-7 days. You should have menstrual bleeding during the time you take the placebos. In packs with 21 tablets, you take no pills for 7 days. Menstrual bleeding occurs during these days. (Some people prefer taking a pill for 28  days to help establish a routine). Extended-interval contraception pills come in packs of 91 pills. The first 84 tablets have both estrogen and progestin. The last 7 pills are placebos. Menstrual bleeding occurs during the placebo days. With this schedule, menstrual bleeding happens once every 3 months. Continuous contraception pills come in packs of 28 pills. All pills in the pack contain estrogen and progestin. With this schedule, regular menstrual bleeding does not happen, but there may be spotting or irregular bleeding. Progestin-only pills This type of pill is often called the mini-pill and contains the progestin hormone only. It comes in packs of 28 pills. In some packs, the last 4 pills are placebos. The pill must be taken at the same time every day. This is very important to prevent pregnancy. Menstrual bleeding may not be regular orpredictable. What are the advantages? Oral contraception provides reliable and continuous contraception if taken as directed. It may treat or decrease symptoms of: Menstrual period cramps. Irregular menstrual cycle or bleeding. Heavy menstrual flow. Abnormal uterine bleeding. Acne, depending on the type of pill. Polycystic ovarian syndrome (POS). Endometriosis. Iron deficiency anemia. Premenstrual symptoms, including severe irritability, depression, or anxiety. It also may: Reduce the risk of endometrial and ovarian cancer. Be used as emergency contraception. Prevent ectopic pregnancies and infections of the fallopian tubes. What can make OCPs less effective? OCPs may be less effective if: You forget to take the pill every day. For progestin-only pills, it is especially important to take the pill at the same time each day. Even taking it 3 hours late can increase the risk of pregnancy. You have a stomach or intestinal disease that reduces your body's ability to absorb the pill. You take   OCPs with other medicines that make OCPs less effective, such as  antibiotics, certain HIV medicines, and some seizure medicines. You take expired OCPs. You forget to restart the pill after 7 days of not taking it. This refers to the packs of 21 pills. What are the side effects and risks? OCPs can sometimes cause side effects, such as: Headache. Depression. Trouble sleeping. Nausea and vomiting. Breast tenderness. Irregular bleeding or spotting during the first several months. Bloating or fluid retention. Increase in blood pressure. Combination pills may slightly increase the risk of: Blood clots. Heart attack. Stroke. Follow these instructions at home: Follow instructions from your health care provider about how to start taking your first cycle of OCPs. Depending on when you start the pill, you may need to use a backup form of birth control, such as condoms, during the first week.Make sure you know what steps to take if you forget to take the pill. Summary Oral contraceptive pills (OCPs) are medicines taken by mouth to prevent pregnancy. They are highly effective when taken exactly as prescribed. OCPs contain a combination of the hormones estrogen and progestin (synthetic progesterone) or progestin only. Before you start taking the pill, you may have a physical exam, blood test, and Pap test. Your health care provider will make sure you are a good candidate for oral contraception. The combination pill may come in a 21-day pack, a 28-day pack, or a 91-day pack. Progestin-only pills come in packs of 28 pills. OCPs can sometimes cause side effects, such as headache, nausea, breast tenderness, or irregular bleeding. This information is not intended to replace advice given to you by your health care provider. Make sure you discuss any questions you have with your healthcare provider. Document Revised: 09/23/2019 Document Reviewed: 09/01/2019 Elsevier Patient Education  2022 Elsevier Inc.  

## 2020-06-15 NOTE — Progress Notes (Signed)
GYNECOLOGY  VISIT   HPI: 40 y.o.   Single Black or African American Not Hispanic or Latino  female   G1P1 with Patient's last menstrual period was 06/15/2020.   here for  birth control consult. Patient is wanting to start birth control   She is sexually active with a new partner. Condom came off during sex 2 weeks ago.   In the past she has had issues with BV  GYNECOLOGIC HISTORY: Patient's last menstrual period was 06/15/2020. Contraception:condoms  Menopausal hormone therapy: none         OB History     Gravida  1   Para  1   Term      Preterm      AB      Living  1      SAB      IAB      Ectopic      Multiple      Live Births                 Patient Active Problem List   Diagnosis Date Noted   Urinary incontinence 04/18/2020   BMI 34.0-34.9,adult 04/18/2020   Prediabetes 03/21/2019   Vitamin D deficiency 01/18/2017   ASCUS with positive high risk HPV cervical 01/09/2015    Past Medical History:  Diagnosis Date   Abnormal Pap smear    Anemia    Pilonidal cyst    Prediabetes    Urinary incontinence     Past Surgical History:  Procedure Laterality Date   LEEP     CIN II    Current Outpatient Medications  Medication Sig Dispense Refill   Dulaglutide (TRULICITY) 0.75 MG/0.5ML SOPN Inject 0.75 mg into the skin once a week. 6 mL 2   No current facility-administered medications for this visit.     ALLERGIES: Hydrocodone, Oxycodone, and Penicillins  Family History  Problem Relation Age of Onset   Lung cancer Paternal Grandmother        smoker   Hypertension Paternal Grandmother    Hypothyroidism Paternal Grandmother    Congestive Heart Failure Father        had been on dialysis x 10 years   Diabetes Father    High Cholesterol Father    Heart disease Father    Kidney disease Father    Cancer Father    Obesity Father    Hypertension Paternal Grandfather    Kidney failure Paternal Grandfather    Stroke Maternal Grandmother     Depression Mother    Sleep apnea Mother    Alcoholism Mother     Social History   Socioeconomic History   Marital status: Single    Spouse name: Not on file   Number of children: Not on file   Years of education: Not on file   Highest education level: Not on file  Occupational History   Occupation: Teacher  Tobacco Use   Smoking status: Never   Smokeless tobacco: Never  Vaping Use   Vaping Use: Never used  Substance and Sexual Activity   Alcohol use: Yes    Alcohol/week: 0.0 - 1.0 standard drinks    Comment: socially--1 drink every 2-3 months   Drug use: No   Sexual activity: Not Currently    Birth control/protection: Abstinence  Other Topics Concern   Not on file  Social History Narrative   Not on file   Social Determinants of Health   Financial Resource Strain: Not on file  Food Insecurity:  Not on file  Transportation Needs: Not on file  Physical Activity: Not on file  Stress: Not on file  Social Connections: Not on file  Intimate Partner Violence: Not on file    Review of Systems  All other systems reviewed and are negative.  PHYSICAL EXAMINATION:    BP 130/78   Pulse 88   Ht 5\' 4"  (1.626 m)   Wt 208 lb (94.3 kg)   LMP 06/15/2020   SpO2 100%   BMI 35.70 kg/m     General appearance: alert, cooperative and appears stated age  1. General counseling and advice on female contraception Discussed options, including OCP's, patch, ring, depo-provera, nexplanon and IUD's. Desires OCP's, no contraindications. Risks reviewed.  - norethindrone-ethinyl estradiol-FE (LOESTRIN FE) 1-20 MG-MCG tablet; Take 1 tablet by mouth daily.  Dispense: 84 tablet; Refill: 0  2. Problem with condom - Pregnancy, urine: negative

## 2020-06-21 ENCOUNTER — Other Ambulatory Visit: Payer: Self-pay

## 2020-06-21 ENCOUNTER — Ambulatory Visit (HOSPITAL_COMMUNITY): Payer: BC Managed Care – PPO | Admitting: Physical Therapy

## 2020-06-21 DIAGNOSIS — N3946 Mixed incontinence: Secondary | ICD-10-CM | POA: Diagnosis not present

## 2020-06-21 NOTE — Therapy (Signed)
[facility] [address] [place], [place], [address] Phone: [phone]   Fax:  [phone]  Physical Therapy Treatment  Patient Details  Name: Katie Curtis MRN: [id number] Date of Birth: [birth year] Referring Provider (PT): Cathlean Marseilles   Encounter Date: 06/21/2020   PT End of Session - 06/21/20 1655     Visit Number 2    Number of Visits 4    Date for PT Re-Evaluation 07/12/20    Authorization Type BCBS    Progress Note Due on Visit 4    PT Start Time 1617    PT Stop Time 1650    PT Time Calculation (min) 33 min    Activity Tolerance Patient tolerated treatment well    Behavior During Therapy [facility] for tasks assessed/performed             Past Medical History:  Diagnosis Date   Abnormal Pap smear    Anemia    Pilonidal cyst    Prediabetes    Urinary incontinence     Past Surgical History:  Procedure Laterality Date   LEEP     CIN II    There were no vitals filed for this visit.   Subjective Assessment - 06/21/20 1619     Subjective Pt has no questions on the exercises she has been given,  states she has been doing something everyday but not as many reps as she should be/    Currently in Pain? No/denies                     [facility] Adult PT Treatment/Exercise - 06/21/20 0001       Exercises   Exercises Lumbar      Lumbar Exercises: Seated   Other Seated Lumbar Exercises kegal quick flicks 2/4x 10; long hold 10/20 x6    Other Seated Lumbar Exercises transverse abdominal x 10      Lumbar Exercises: Supine   Heel Slides 5 reps    Other Supine Lumbar Exercises abdominal crunch x 8      Lumbar Exercises: Sidelying   Hip Abduction Both;10 reps      Lumbar Exercises: Prone   Straight Leg Raise --   4 reps                     PT Short Term Goals - 06/21/20 1659       PT SHORT TERM GOAL #1   Title PT to be I in HEP to allow 100% continence throughout the day    Time 4     Period Weeks    Status On-going    Target Date 07/10/20                      Plan - 06/21/20 1655     Clinical Impression Statement Pt unable to tell if she has improved as she has started her menstral period.  Therapist instructed patient in new exercises and explained to attmept to do them at home on a daily basis; pt verbalized understanding.  Pt attempted all 4 hip extension, however pt did not have the strength or control for this exercise.    Examination-Activity Limitations Dressing    Examination-Participation Restrictions Other    Stability/Clinical Decision Making Stable/Uncomplicated    Clinical Decision Making Low    Rehab Potential Good    PT Frequency 1x / week    PT Duration 4 weeks  PT Treatment/Interventions Patient/family education;Therapeutic exercise    PT Next Visit Plan Instruct in t band exercises, attempt all four exercises again.    PT Home Exercise Plan kegal 2/4 x 10; long hold 10 second hold/20 rest x 4; 6/16:  increase to 6 reps on long kegal holds, add crunches, heelslides, abduction and prone hip extension .             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 [place], [place], [address] Phone: [phone]   Fax:  [phone]  Name: Katie Curtis MRN: [id number] Date of Birth: [birth year]

## 2020-06-22 ENCOUNTER — Ambulatory Visit: Payer: BC Managed Care – PPO | Admitting: Nurse Practitioner

## 2020-06-25 ENCOUNTER — Telehealth: Payer: Self-pay | Admitting: *Deleted

## 2020-06-25 NOTE — Telephone Encounter (Signed)
I agree with your advice. The first month will likely have the most irregular bleeding. As long as she is taking it daily at the same time she shouldn't worry.

## 2020-06-25 NOTE — Telephone Encounter (Signed)
Patient informed. 

## 2020-06-25 NOTE — Telephone Encounter (Signed)
Patient was prescribed  Loestrin FE 1/20 pill and started pills same day. Report bleeding started on 06/17/20, reports she is wearing a pad and changing only twice per day. She states this flow is heavier than her normal flow, I explained sometimes the birth control pills can cause that to happened. Takes daily, on time, no missed pills, states she thought the bleeding was going to stop yesterday but this am she noticed it did not. I explained to her that it could take up to 3 months for her body to adjust to the birth control. She asked that I inform you of this as well for any other recommendations.  Please advise

## 2020-06-28 ENCOUNTER — Other Ambulatory Visit: Payer: Self-pay

## 2020-06-28 ENCOUNTER — Ambulatory Visit (HOSPITAL_COMMUNITY): Payer: BC Managed Care – PPO | Admitting: Physical Therapy

## 2020-06-28 DIAGNOSIS — N3946 Mixed incontinence: Secondary | ICD-10-CM

## 2020-06-28 NOTE — Therapy (Signed)
[facility] [address] [place], [place], [address] Phone: [phone]   Fax:  [phone]  Physical Therapy Treatment  Patient Details  Name: Katie Curtis MRN: [id number] Date of Birth: August 04, 1980 Referring Provider (PT): Cathlean Marseilles   Encounter Date: 06/28/2020   PT End of Session - 06/28/20 1702     Visit Number 3    Number of Visits 4    Date for PT Re-Evaluation 07/12/20    Authorization Type BCBS    Progress Note Due on Visit 4    PT Start Time 1617    PT Stop Time 1702    PT Time Calculation (min) 45 min    Activity Tolerance Patient tolerated treatment well    Behavior During Therapy [facility] for tasks assessed/performed             Past Medical History:  Diagnosis Date   Abnormal Pap smear    Anemia    Pilonidal cyst    Prediabetes    Urinary incontinence     Past Surgical History:  Procedure Laterality Date   LEEP     CIN II    There were no vitals filed for this visit.   Subjective Assessment - 06/28/20 1620     Subjective Pt states that the lying down exercises was an epic fail due to the fact that she wasn't sure if she as completing them correctily.  She has noticed some change she has not had to go to the restroom as often.    Currently in Pain? No/denies                               [facility] Adult PT Treatment/Exercise - 06/28/20 0001       Exercises   Exercises Lumbar      Lumbar Exercises: Standing   Other Standing Lumbar Exercises t band ; shoulder flex Rt then LT, putt putt, Shld extension to opposite hip and B shld extension x 10 with red tband.      Lumbar Exercises: Seated   Other Seated Lumbar Exercises kegal quick flicks 2/4x 10; long hold 15/30 x6    Other Seated Lumbar Exercises transverse abdominal x 10      Lumbar Exercises: Supine   Heel Slides 5 reps    Other Supine Lumbar Exercises abdominal crunch x 8      Lumbar Exercises: Sidelying   Hip  Abduction Both;10 reps      Lumbar Exercises: Prone   Straight Leg Raise --   4 reps                     PT Short Term Goals - 06/21/20 1659       PT SHORT TERM GOAL #1   Title PT to be I in HEP to allow 100% continence throughout the day    Time 4    Period Weeks    Status On-going    Target Date 07/10/20                      Plan - 06/28/20 1702     Clinical Impression Statement Pt form is improved in her exercises even though she was not sure if she was completing them correctly.  Pt has noted a stronger Kegal contraction.  Instructed pt on tband door exercises.    Examination-Activity Limitations Dressing    Examination-Participation Restrictions  Other    Stability/Clinical Decision Making Stable/Uncomplicated    Rehab Potential Good    PT Frequency 1x / week    PT Duration 4 weeks    PT Treatment/Interventions Patient/family education;Therapeutic exercise    PT Next Visit Plan Review goals and exercises    PT Home Exercise Plan kegal 2/4 x 10; long hold 10 second hold/20 rest x 4; 6/16:  increase to 6 reps on long kegal holds, add crunches, heelslides, abduction and prone hip extension .6/23:  tband flex, low horizontal adduction, single and double arm extension.             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 [place], [place], [address] Phone: [phone]   Fax:  [phone]  Name: Katie Curtis MRN: [id number] Date of Birth: 07-Jan-1980

## 2020-07-12 ENCOUNTER — Ambulatory Visit (HOSPITAL_COMMUNITY): Payer: BC Managed Care – PPO | Admitting: Physical Therapy

## 2020-07-18 ENCOUNTER — Ambulatory Visit: Payer: BC Managed Care – PPO | Admitting: Nurse Practitioner

## 2020-07-19 ENCOUNTER — Telehealth (HOSPITAL_COMMUNITY): Payer: Self-pay | Admitting: Physical Therapy

## 2020-07-19 ENCOUNTER — Ambulatory Visit (HOSPITAL_COMMUNITY): Payer: BC Managed Care – PPO | Attending: Nurse Practitioner | Admitting: Physical Therapy

## 2020-07-19 NOTE — Telephone Encounter (Signed)
Attempted to call pt about missed appointment.  PT mailbox is full unable to leave a message.  Virgina Organ, PT CLT [phone]

## 2020-09-04 ENCOUNTER — Telehealth: Payer: Self-pay | Admitting: Obstetrics and Gynecology

## 2020-09-04 ENCOUNTER — Other Ambulatory Visit: Payer: Self-pay | Admitting: Obstetrics and Gynecology

## 2020-09-04 DIAGNOSIS — Z3009 Encounter for other general counseling and advice on contraception: Secondary | ICD-10-CM

## 2020-09-04 NOTE — Telephone Encounter (Signed)
Please have patient make a short office appointment for a birth control pill follow up visit.  She will need to have her blood pressure checked.   She had a new start on combined oral contraception.   Ok for refill of birth control for one month.

## 2020-09-04 NOTE — Telephone Encounter (Signed)
Patient voicemail full 

## 2020-09-04 NOTE — Telephone Encounter (Signed)
Patient is requesting 90 day supply, annual exam was on 05/27/20, mammo 09/2019

## 2020-09-04 NOTE — Telephone Encounter (Signed)
Annual exam was 5/22, mammogram 09/2019

## 2020-09-29 ENCOUNTER — Other Ambulatory Visit: Payer: Self-pay | Admitting: Obstetrics and Gynecology

## 2020-09-29 DIAGNOSIS — Z3009 Encounter for other general counseling and advice on contraception: Secondary | ICD-10-CM

## 2020-10-28 ENCOUNTER — Emergency Department (HOSPITAL_COMMUNITY): Payer: BC Managed Care – PPO

## 2020-10-28 ENCOUNTER — Emergency Department (HOSPITAL_COMMUNITY)
Admission: EM | Admit: 2020-10-28 | Discharge: 2020-10-28 | Disposition: A | Discharge status: Home / Self Care | Payer: BC Managed Care – PPO | Attending: Emergency Medicine | Admitting: Emergency Medicine

## 2020-10-28 ENCOUNTER — Encounter (HOSPITAL_COMMUNITY): Payer: Self-pay | Admitting: Emergency Medicine

## 2020-10-28 ENCOUNTER — Other Ambulatory Visit: Payer: Self-pay

## 2020-10-28 DIAGNOSIS — N83201 Unspecified ovarian cyst, right side: Secondary | ICD-10-CM | POA: Diagnosis not present

## 2020-10-28 DIAGNOSIS — R1031 Right lower quadrant pain: Secondary | ICD-10-CM

## 2020-10-28 DIAGNOSIS — D72829 Elevated white blood cell count, unspecified: Secondary | ICD-10-CM | POA: Insufficient documentation

## 2020-10-28 DIAGNOSIS — R52 Pain, unspecified: Secondary | ICD-10-CM

## 2020-10-28 DIAGNOSIS — M549 Dorsalgia, unspecified: Secondary | ICD-10-CM | POA: Diagnosis not present

## 2020-10-28 DIAGNOSIS — K829 Disease of gallbladder, unspecified: Secondary | ICD-10-CM

## 2020-10-28 LAB — URINALYSIS, ROUTINE W REFLEX MICROSCOPIC
Bilirubin Urine: NEGATIVE
Glucose, UA: 50 mg/dL — AB
Ketones, ur: NEGATIVE mg/dL
Leukocytes,Ua: NEGATIVE
Nitrite: NEGATIVE
Protein, ur: NEGATIVE mg/dL
Specific Gravity, Urine: 1.011 (ref 1.005–1.030)
pH: 7 (ref 5.0–8.0)

## 2020-10-28 LAB — CBC
HCT: 36.2 % (ref 36.0–46.0)
Hemoglobin: 11.8 g/dL — ABNORMAL LOW (ref 12.0–15.0)
MCH: 29.2 pg (ref 26.0–34.0)
MCHC: 32.6 g/dL (ref 30.0–36.0)
MCV: 89.6 fL (ref 80.0–100.0)
Platelets: 317 10*3/uL (ref 150–400)
RBC: 4.04 MIL/uL (ref 3.87–5.11)
RDW: 13.4 % (ref 11.5–15.5)
WBC: 15 10*3/uL — ABNORMAL HIGH (ref 4.0–10.5)
nRBC: 0 % (ref 0.0–0.2)

## 2020-10-28 LAB — COMPREHENSIVE METABOLIC PANEL
ALT: 17 U/L (ref 0–44)
AST: 27 U/L (ref 15–41)
Albumin: 3.9 g/dL (ref 3.5–5.0)
Alkaline Phosphatase: 56 U/L (ref 38–126)
Anion gap: 9 (ref 5–15)
BUN: 9 mg/dL (ref 6–20)
CO2: 22 mmol/L (ref 22–32)
Calcium: 9.3 mg/dL (ref 8.9–10.3)
Chloride: 105 mmol/L (ref 98–111)
Creatinine, Ser: 0.69 mg/dL (ref 0.44–1.00)
GFR, Estimated: >60 mL/min (ref >60)
Glucose, Bld: 189 mg/dL — ABNORMAL HIGH (ref 70–99)
Potassium: 3.7 mmol/L (ref 3.5–5.1)
Sodium: 136 mmol/L (ref 135–145)
Total Bilirubin: 0.4 mg/dL (ref 0.3–1.2)
Total Protein: 7.5 g/dL (ref 6.5–8.1)

## 2020-10-28 LAB — POC URINE PREG, ED: Preg Test, Ur: NEGATIVE

## 2020-10-28 LAB — LIPASE, BLOOD: Lipase: 26 U/L (ref 11–51)

## 2020-10-28 MED ORDER — SODIUM CHLORIDE 0.9 % IV BOLUS
1000.0000 mL | Freq: Once | INTRAVENOUS | Status: AC
  Administered 2020-10-28: 1000 mL via INTRAVENOUS

## 2020-10-28 MED ORDER — MORPHINE SULFATE (PF) 4 MG/ML IV SOLN
4.0000 mg | Freq: Once | INTRAVENOUS | Status: AC
  Administered 2020-10-28: 4 mg via INTRAVENOUS
  Filled 2020-10-28: qty 1

## 2020-10-28 MED ORDER — IOHEXOL 300 MG/ML  SOLN
100.0000 mL | Freq: Once | INTRAMUSCULAR | Status: AC | PRN
  Administered 2020-10-28: 100 mL via INTRAVENOUS

## 2020-10-28 MED ORDER — ONDANSETRON HCL 4 MG/2ML IJ SOLN
4.0000 mg | Freq: Once | INTRAMUSCULAR | Status: AC
  Administered 2020-10-28: 4 mg via INTRAVENOUS
  Filled 2020-10-28: qty 2

## 2020-10-28 NOTE — ED Provider Notes (Addendum)
[facility] EMERGENCY DEPARTMENT Provider Note   CSN: [id number] Arrival date & time: 10/28/20  9024     History Chief Complaint  Patient presents with   Abdominal Pain    Katie Curtis is a 40 y.o. female.  HPI     This a 40 year old female with a history of prediabetes who presents with abdominal pain.  Patient reports intermittent but recurrent right lower quadrant abdominal pain.  She states that over the last month she has had 3 discrete episodes.  This episode began around 1 AM.  She states that the pain is mostly in her right lower quadrant but also radiates into her back.  It is crampy in nature.  She rates her pain at 9 out of 10.  She is not had any associated hematuria or dysuria.  Does not believe herself to be pregnant.  No fevers.  No known history of kidney stones.  She states that the episodes of pain generally last around 30 minutes.  She did take Aleve prior to arrival with some relief.  Past Medical History:  Diagnosis Date   Abnormal Pap smear    Anemia    Pilonidal cyst    Prediabetes    Urinary incontinence     Patient Active Problem List   Diagnosis Date Noted   Urinary incontinence 04/18/2020   BMI 34.0-34.9,adult 04/18/2020   Prediabetes 03/21/2019   Vitamin D deficiency 01/18/2017   ASCUS with positive high risk HPV cervical 01/09/2015    Past Surgical History:  Procedure Laterality Date   LEEP     CIN II     OB History     Gravida  1   Para  1   Term      Preterm      AB      Living  1      SAB      IAB      Ectopic      Multiple      Live Births              Family History  Problem Relation Age of Onset   Lung cancer Paternal Grandmother        smoker   Hypertension Paternal Grandmother    Hypothyroidism Paternal Grandmother    Congestive Heart Failure Father        had been on dialysis x 10 years   Diabetes Father    High Cholesterol Father    Heart disease Father    Kidney disease Father     Cancer Father    Obesity Father    Hypertension Paternal Grandfather    Kidney failure Paternal Grandfather    Stroke Maternal Grandmother    Depression Mother    Sleep apnea Mother    Alcoholism Mother     Social History   Tobacco Use   Smoking status: Never   Smokeless tobacco: Never  Vaping Use   Vaping Use: Never used  Substance Use Topics   Alcohol use: Yes    Alcohol/week: 0.0 - 1.0 standard drinks    Comment: socially--1 drink every 2-3 months   Drug use: No    Home Medications Prior to Admission medications   Medication Sig Start Date End Date Taking? Authorizing Provider  BLISOVI FE 1/20 1-20 MG-MCG tablet TAKE 1 TABLET BY MOUTH DAILY 10/01/20   Ardell Isaacs, Forrestine Him, MD  Dulaglutide (TRULICITY) 0.75 MG/0.5ML SOPN Inject 0.75 mg into the skin once a week. 04/18/20  Valentino Nose, NP    Allergies    Hydrocodone, Oxycodone, and Penicillins  Review of Systems   Review of Systems  Constitutional:  Negative for fever.  Respiratory:  Negative for shortness of breath.   Cardiovascular:  Negative for chest pain.  Gastrointestinal:  Positive for abdominal pain and nausea. Negative for constipation, diarrhea and vomiting.  Genitourinary:  Negative for dysuria and hematuria.  All other systems reviewed and are negative.  Physical Exam Updated Vital Signs BP (!) 129/94   Pulse 92   Temp 98.5 F (36.9 C) (Oral)   Resp 17   Ht 1.626 m (5\' 4" )   Wt 92.5 kg   LMP 10/14/2020 (Approximate)   SpO2 100%   BMI 35.02 kg/m   Physical Exam Vitals and nursing note reviewed.  Constitutional:      Appearance: She is well-developed. She is not ill-appearing.  HENT:     Head: Normocephalic and atraumatic.     Mouth/Throat:     Mouth: Mucous membranes are moist.  Eyes:     Pupils: Pupils are equal, round, and reactive to light.  Cardiovascular:     Rate and Rhythm: Normal rate and regular rhythm.     Heart sounds: Normal heart sounds.  Pulmonary:     Effort:  Pulmonary effort is normal. No respiratory distress.     Breath sounds: No wheezing.  Abdominal:     General: Bowel sounds are normal.     Palpations: Abdomen is soft.     Tenderness: There is abdominal tenderness in the right lower quadrant. There is no guarding or rebound. Negative signs include Murphy's sign and Rovsing's sign.  Musculoskeletal:     Cervical back: Neck supple.  Skin:    General: Skin is warm and dry.  Neurological:     Mental Status: She is alert and oriented to person, place, and time.  Psychiatric:        Mood and Affect: Mood normal.    ED Results / Procedures / Treatments   Labs (all labs ordered are listed, but only abnormal results are displayed) Labs Reviewed  COMPREHENSIVE METABOLIC PANEL - Abnormal; Notable for the following components:      Result Value   Glucose, Bld 189 (*)    All other components within normal limits  CBC - Abnormal; Notable for the following components:   WBC 15.0 (*)    Hemoglobin 11.8 (*)    All other components within normal limits  LIPASE, BLOOD  URINALYSIS, ROUTINE W REFLEX MICROSCOPIC  POC URINE PREG, ED    EKG None  Radiology No results found.  Procedures Procedures   Medications Ordered in ED Medications  iohexol (OMNIPAQUE) 300 MG/ML solution 100 mL (has no administration in time range)  sodium chloride 0.9 % bolus 1,000 mL (0 mLs Intravenous Stopped 10/28/20 0629)  morphine 4 MG/ML injection 4 mg (4 mg Intravenous Given 10/28/20 0522)  ondansetron (ZOFRAN) injection 4 mg (4 mg Intravenous Given 10/28/20 0521)    ED Course  I have reviewed the triage vital signs and the nursing notes.  Pertinent labs & imaging results that were available during my care of the patient were reviewed by me and considered in my medical decision making (see chart for details).    MDM Rules/Calculators/A&P                           Patient presents with abdominal pain.  She is overall nontoxic and  vital signs are  reassuring.  She does have some reproducible right lower quadrant pain but also has back discomfort.  Given description of pain, kidney stone would be high on the differential.  Other etiologies include ovarian pathology such as cyst or torsion.  I feel appendicitis is less likely.  Patient was given pain and nausea medication.  Lab work reviewed.  No significant metabolic derangements.  Of note, she does have a leukocytosis.  For this reason, will obtain a CT scan with contrast to evaluate for both infection and kidney stone.  Patient signed out to oncoming provider.  7:07 AM CT independently reviewed by myself.  There appears to be a large right-sided ovarian cyst.  This would increase risk for torsion.  Will await CT official read but ultrasound to rule out torsion was placed.  Patient updated.  Final Clinical Impression(s) / ED Diagnoses Final diagnoses:  None    Rx / DC Orders ED Discharge Orders     None        Mariluz Crespo, Mayer Masker, MD 10/28/20 2446    Shon Baton, MD 10/28/20 [id number]

## 2020-10-28 NOTE — ED Triage Notes (Signed)
Pt with c/o R sided abdominal pain x 1 month. States pain is intermittent but that "tonight's pain feels different". Denies any urinary symptoms. States BM's are normal.

## 2020-10-28 NOTE — Discharge Instructions (Signed)
The testing today indicates that your gallbladder is causing the problems.  Make sure you avoid eating fat in your diet, to decrease the chances of more pain.  Take ibuprofen 400 mg, 3 times a day with meals for pain if needed.  Call the surgeons office, Dr. Henreitta Leber, to get an appointment to be seen as soon as possible for further care and treatment.

## 2020-10-28 NOTE — ED Notes (Signed)
Patient transported to CT 

## 2020-10-28 NOTE — ED Provider Notes (Signed)
7:15 AM-checkup with Dr. Toni Amend evaluate patient after return of additional imaging.  CT abdomen pelvis ordered, preliminary reading by Dr. Wilkie Aye for right ovarian cyst.  Subsequent ultrasound pelvis for evaluation of possible ovarian torsion ordered.  7:50 AM-she is fairly comfortable now texting on her telephone.  She reports that the pain she has been having over the last month is usually in the right lower quadrant but then states that it is also in the right upper quadrant.  She is having states she continues to have normal menses, without other pelvic complaints.  She does not have a history of ovarian cyst in the past.  She denies postprandial aggravation of pain although the pain was worse to span, this morning about 1 AM.  No prior abdominal surgeries.  Additional imaging is pending.  10:30 AM-ultrasound imaging reports returned.  No pelvic pathology.  Mild cholecystitis without gallstones or sludge, on abdominal ultrasound.  Case was discussed with general surgery, Dr. Henreitta Leber who states patient can be discharged and she will see the patient in the office.  Patient is comfortable with this.  She is pain-free now.  She declined a prescription for narcotics.  She wants to take ibuprofen for pain.  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  She is instructed on low-fat diet, and symptomatic treatment for gallbladder disease.  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  Doubt acute cholecystitis requiring hospitalization or emergent operative management.   Mancel Bale, MD 10/28/20 1031

## 2020-11-06 ENCOUNTER — Other Ambulatory Visit: Payer: Self-pay

## 2020-11-06 ENCOUNTER — Ambulatory Visit: Payer: BC Managed Care – PPO | Admitting: General Surgery

## 2020-11-06 ENCOUNTER — Encounter: Payer: Self-pay | Admitting: General Surgery

## 2020-11-06 VITALS — BP 133/84 | HR 82 | Temp 97.8°F | Resp 14 | Ht 64.0 in | Wt 204.0 lb

## 2020-11-06 DIAGNOSIS — K8050 Calculus of bile duct without cholangitis or cholecystitis without obstruction: Secondary | ICD-10-CM

## 2020-11-06 DIAGNOSIS — K811 Chronic cholecystitis: Secondary | ICD-10-CM

## 2020-11-06 NOTE — Patient Instructions (Signed)
Gallbladder Eating Plan If you have a gallbladder condition, you may have trouble digesting fats. Eating a low-fat diet can help reduce your symptoms, and may be helpful before and after having surgery to remove your gallbladder (cholecystectomy). Your health care provider may recommend that you work with a diet and nutrition specialist (dietitian) to help you reduce the amount of fat in your diet. What are tips for following this plan? General guidelines Limit your fat intake to less than 30% of your total daily calories. If you eat around 1,800 calories each day, this is less than 60 grams (g) of fat per day. Fat is an important part of a healthy diet. Eating a low-fat diet can make it hard to maintain a healthy body weight. Ask your dietitian how much fat, calories, and other nutrients you need each day. Eat small, frequent meals throughout the day instead of three large meals. Drink at least 8-10 cups of fluid a day. Drink enough fluid to keep your urine clear or pale yellow. Limit alcohol intake to no more than 1 drink a day for nonpregnant women and 2 drinks a day for men. One drink equals 12 oz of beer, 5 oz of wine, or 1 oz of hard liquor. Reading food labels  Check Nutrition Facts on food labels for the amount of fat per serving. Choose foods with less than 3 grams of fat per serving. Shopping Choose nonfat and low-fat healthy foods. Look for the words "nonfat," "low fat," or "fat free." Avoid buying processed or prepackaged foods. Cooking Cook using low-fat methods, such as baking, broiling, grilling, or boiling. Cook with small amounts of healthy fats, such as olive oil, grapeseed oil, canola oil, or sunflower oil. What foods are recommended? All fresh, frozen, or canned fruits and vegetables. Whole grains. Low-fat or non-fat (skim) milk and yogurt. Lean meat, skinless poultry, fish, eggs, and beans. Low-fat protein supplement powders or drinks. Spices and herbs. What foods are  not recommended? High-fat foods. These include baked goods, fast food, fatty cuts of meat, ice cream, french toast, sweet rolls, pizza, cheese bread, foods covered with butter, creamy sauces, or cheese. Fried foods. These include french fries, tempura, battered fish, breaded chicken, fried breads, and sweets. Foods with strong odors. Foods that cause bloating and gas. Summary A low-fat diet can be helpful if you have a gallbladder condition, or before and after gallbladder surgery. Limit your fat intake to less than 30% of your total daily calories. This is about 60 g of fat if you eat 1,800 calories each day. Eat small, frequent meals throughout the day instead of three large meals. This information is not intended to replace advice given to you by your health care provider. Make sure you discuss any questions you have with your health care provider. Document Revised: 08/11/2019 Document Reviewed: 08/11/2019 Elsevier Patient Education  2022 [employer] Cholecystitis Cholecystitis is inflammation of the gallbladder. It is often called a gallbladder attack. The gallbladder is a pear-shaped organ that lies beneath the liver on the right side of the body. The gallbladder stores bile, which is a fluid that helps the body digest fats. If bile builds up in your gallbladder, your gallbladder becomes inflamed. This condition may occur suddenly. Cholecystitis is a serious condition and requires treatment. What are the causes? The most common cause of this condition is gallstones. Gallstones can block the tube (duct) that carries bile out of your gallbladder. This causes bile to build up. Other causes include: Damage to the  gallbladder due to a decrease in blood flow. Infections in the bile ducts. Scars or kinks in the bile ducts. Tumors in the liver, pancreas, or gallbladder. What increases the risk? You are more likely to develop this condition if: You have sickle cell disease. You take birth  control pills or use estrogen. You have alcoholic liver disease. You have liver cirrhosis. You have your nutrition delivered through a vein (parenteral nutrition). You are critically ill. You do not eat or drink for a long time. This is also called "fasting." You are obese. You lose weight too fast. You are pregnant. You have high levels of fat (triglycerides) in the blood. You have pancreatitis. What are the signs or symptoms? Symptoms of this condition include: Pain in the abdomen, especially in the upper right area of the abdomen. Tenderness or bloating in the abdomen. Nausea. Vomiting. Fever. Chills. How is this diagnosed? This condition is diagnosed with a medical history and physical exam. You may also have other tests, including: Imaging tests, such as: An ultrasound of the gallbladder. A CT scan of the abdomen. A gallbladder nuclear scan (HIDA scan). This scan allows your health care provider to see the bile moving from your liver to your gallbladder and on to your small intestine. MRI. Blood tests, such as: A complete blood count. The white blood cell count may be higher than normal. Liver function tests. Certain types of gallstones cause some results to be higher than normal. How is this treated? Treatment may include: Surgery to remove your gallbladder (cholecystectomy). Antibiotic medicine, usually through an IV. Fasting for a certain amount of time. Giving IV fluids. Medicine to treat pain or vomiting. Follow these instructions at home: If you had surgery, follow instructions from your health care provider about home care after the procedure. Medicines  Take over-the-counter and prescription medicines only as told by your health care provider. If you were prescribed an antibiotic medicine, take it as told by your health care provider. Do not stop taking the antibiotic even if you start to feel better. General instructions Follow instructions from your health  care provider about what to eat or drink. When you are allowed to eat, avoid eating or drinking anything that triggers your symptoms. Do not lift anything that is heavier than 10 lb (4.5 kg), or the limit that you are told, until your health care provider says that it is safe. Do not use any products that contain nicotine or tobacco, such as cigarettes and e-cigarettes. If you need help quitting, ask your health care provider. Keep all follow-up visits as told by your health care provider. This is important. Contact a health care provider if: Your pain is not controlled with medicine. You have a fever. Get help right away if: Your pain moves to another part of your abdomen or to your back. You continue to have symptoms or you develop new symptoms even with treatment. Summary Cholecystitis is inflammation of the gallbladder. The most common cause of this condition is gallstones. Gallstones can block the tube (duct) that carries bile out of your gallbladder. Common symptoms are pain in the abdomen, nausea, vomiting, fever, and chills. This condition is treated with surgery to remove the gallbladder, medicines, fasting, and IV fluids. Follow your health care provider's instructions for eating and drinking. Avoid eating anything that triggers your symptoms. This information is not intended to replace advice given to you by your health care provider. Make sure you discuss any questions you have with your health care  provider.  Minimally Invasive Cholecystectomy Minimally invasive cholecystectomy is surgery to remove the gallbladder. The gallbladder is a pear-shaped organ that lies beneath the liver on the right side of the body. The gallbladder stores bile, which is a fluid that helps the body digest fats. Cholecystectomy is often done to treat inflammation of the gallbladder (cholecystitis). This condition is usually caused by a buildup of gallstones (cholelithiasis) in the gallbladder. Gallstones can  block the flow of bile, which can result in inflammation and pain. In severe cases, emergency surgery may be required. This procedure is done though small incisions in the abdomen, instead of one large incision. It is also called laparoscopic surgery. A thin scope with a camera (laparoscope) is inserted through one incision. Then surgical instruments are inserted through the other incisions. In some cases, a minimally invasive surgery may need to be changed to a surgery that is done through a larger incision. This is called open surgery. Tell a health care provider about: Any allergies you have. All medicines you are taking, including vitamins, herbs, eye drops, creams, and over-the-counter medicines. Any problems you or family members have had with anesthetic medicines. Any blood disorders you have. Any surgeries you have had. Any medical conditions you have. Whether you are pregnant or may be pregnant. What are the risks? Generally, this is a safe procedure. However, problems may occur, including: Infection. Bleeding. Allergic reactions to medicines. Damage to nearby structures or organs. A stone remaining in the common bile duct. The common bile duct carries bile from the gallbladder into the small intestine. A bile leak from the cyst duct that is clipped when your gallbladder is removed. What happens before the procedure? Medicines Ask your health care provider about: Changing or stopping your regular medicines. This is especially important if you are taking diabetes medicines or blood thinners. Taking medicines such as aspirin and ibuprofen. These medicines can thin your blood. Do not take these medicines unless your health care provider tells you to take them. Taking over-the-counter medicines, vitamins, herbs, and supplements. General instructions Let your health care provider know if you develop a cold or an infection before surgery. Plan to have someone take you home from the  hospital or clinic. If you will be going home right after the procedure, plan to have someone with you for 24 hours. Ask your health care provider: How your surgery site will be marked. What steps will be taken to help prevent infection. These may include: Removing hair at the surgery site. Washing skin with a germ-killing soap. Taking antibiotic medicine. What happens during the procedure?  An IV will be inserted into one of your veins. You will be given one or both of the following: A medicine to help you relax (sedative). A medicine to make you fall asleep (general anesthetic). A breathing tube will be placed in your mouth. Your surgeon will make several small incisions in your abdomen. The laparoscope will be inserted through one of the small incisions. The camera on the laparoscope will send images to a monitor in the operating room. This lets your surgeon see inside your abdomen. A gas will be pumped into your abdomen. This will expand your abdomen to give the surgeon more room to perform the surgery. Other tools that are needed for the procedure will be inserted through the other incisions. The gallbladder will be removed through one of the incisions. Your common bile duct may be examined. If stones are found in the common bile duct, they may  be removed. After your gallbladder has been removed, the incisions will be closed with stitches (sutures), staples, or skin glue. Your incisions may be covered with a bandage (dressing). The procedure may vary among health care providers and hospitals. What happens after the procedure? Your blood pressure, heart rate, breathing rate, and blood oxygen level will be monitored until you leave the hospital or clinic. You will be given medicines as needed to control your pain. If you were given a sedative during the procedure, it can affect you for several hours. Do not drive or operate machinery until your health care provider says that it is  safe. Summary Minimally invasive cholecystectomy, also called laparoscopic cholecystectomy, is surgery to remove the gallbladder using small incisions. Tell your health care provider about all the medical conditions you have and all the medicines you are taking for those conditions. Before the procedure, follow instructions about eating or drinking restrictions and changing or stopping medicines. If you were given a sedative during the procedure, it can affect you for several hours. Do not drive or operate machinery until your health care provider says that it is safe. This information is not intended to replace advice given to you by your health care provider. Make sure you discuss any questions you have with your health care provider. Document Revised: 09/27/2018 Document Reviewed: 09/27/2018 Elsevier Patient Education  2022 Elsevier Inc.  Document Revised: 04/30/2017 Document Reviewed: 05/01/2017 Elsevier Patient Education  2022 [employer].

## 2020-11-06 NOTE — Progress Notes (Signed)
[facility] History and Physical  Reason for Referral: Cholecystis, biliary colic  Referring Physician: ED   Chief Complaint   New Patient (Initial Visit)     Katie Curtis is a 40 y.o. female.  HPI: Ms. Katie Curtis is a 40 yo who says that since August she has started to have some episodes of RUQ pain and associated nausea and vomiting. The episodes are getting more intense. She eatings a lot of greasy foods so she cannot say that specifically causes issues. She was seen in the ED after the most recent episodes and said the pain was improved with medication in the ED but she really did not get back to 100% for about 48 hrs. She says that the pain is in the RUQ and cramping in nature. She was worked up with and [place] that demonstrated some pericholecystic fluid and thickening but no obvious stones. Since she was feeling better she was released from the ED with close follow up.   Past Medical History:  Diagnosis Date   Abnormal Pap smear    Anemia    Pilonidal cyst    Prediabetes    Urinary incontinence     Past Surgical History:  Procedure Laterality Date   LEEP     CIN II    Family History  Problem Relation Age of Onset   Lung cancer Paternal Grandmother        smoker   Hypertension Paternal Grandmother    Hypothyroidism Paternal Grandmother    Congestive Heart Failure Father        had been on dialysis x 10 years   Diabetes Father    High Cholesterol Father    Heart disease Father    Kidney disease Father    Cancer Father    Obesity Father    Hypertension Paternal Grandfather    Kidney failure Paternal Grandfather    Stroke Maternal Grandmother    Depression Mother    Sleep apnea Mother    Alcoholism Mother     Social History   Tobacco Use   Smoking status: Never   Smokeless tobacco: Never  Vaping Use   Vaping Use: Never used  Substance Use Topics   Alcohol use: Yes    Alcohol/week: 0.0 - 1.0 standard drinks    Comment: socially--1 drink  every 2-3 months   Drug use: No    Medications: I have reviewed the patient's current medications. Allergies as of 11/06/2020       Reactions   Hydrocodone    Oxycodone    Penicillins         Medication List        Accurate as of November 06, 2020 12:29 PM. If you have any questions, ask your nurse or doctor.          Blisovi FE 1/20 1-20 MG-MCG tablet Generic drug: norethindrone-ethinyl estradiol-FE TAKE 1 TABLET BY MOUTH DAILY   Trulicity 0.75 MG/0.5ML Sopn Generic drug: Dulaglutide Inject 0.75 mg into the skin once a week.         ROS:  A comprehensive review of systems was negative except for: Gastrointestinal: positive for resolved Abdominal pain Skin dryness   Blood pressure 133/84, pulse 82, temperature 97.8 F (36.6 C), temperature source Other (Comment), resp. rate 14, height 5\' 4"  (1.626 m), weight 204 lb (92.5 kg), last menstrual period 10/14/2020, SpO2 98 %. Physical Exam Vitals reviewed.  Constitutional:      Appearance: Normal appearance.  HENT:  Head: Normocephalic.  Eyes:     Extraocular Movements: Extraocular movements intact.  Cardiovascular:     Rate and Rhythm: Normal rate and regular rhythm.  Pulmonary:     Effort: Pulmonary effort is normal.     Breath sounds: Normal breath sounds.  Abdominal:     General: There is no distension.     Palpations: Abdomen is soft.     Tenderness: There is abdominal tenderness.  Musculoskeletal:        General: No swelling. Normal range of motion.     Cervical back: Normal range of motion.  Skin:    General: Skin is warm.  Neurological:     General: No focal deficit present.     Mental Status: She is alert and oriented to person, place, and time.  Psychiatric:        Mood and Affect: Mood normal.        Behavior: Behavior normal.        Thought Content: Thought content normal.    Results: CLINICAL DATA:  Right-sided pain for 1 month.   EXAM: ULTRASOUND ABDOMEN LIMITED RIGHT UPPER  QUADRANT   COMPARISON:  None.   FINDINGS: Gallbladder:   No gallstones, or gallbladder sludge. Gallbladder wall is thickened measuring 5 mm and there is mild pericholecystic fluid. Positive sonographic Murphy's sign identified.   Common bile duct:   Diameter: 9 mm   Liver:   Increased parenchymal echogenicity. No focal lesion identified. Portal vein is patent on color Doppler imaging with normal direction of blood flow towards the liver.   Other: None.   IMPRESSION: 1. Gallbladder wall thickening, pericholecystic fluid and positive sonographic Murphy's sign. Cannot rule out acalculous cholecystitis. 2. Dilated common bile duct measures 9 mm. 3. Hepatic steatosis.     Electronically Signed   By: Signa Kell M.D.   On: 10/28/2020 09:34   Assessment & Plan:  Katie Curtis is a 40 y.o. female with patient the cholecystitis and inflammation of the gallbladder and likely she either passed a stone given the CBD 59mm or has some sludge. Discussed that this is unlikely to get better. Discussed she could trial a GB diet but that I am not sure it will change the fact she will probably need surgery. Since there are not major stones I do not think ursodiol will help. She had normal LFTs at ED.   PLAN: I counseled the patient about the indication, risks and benefits of laparoscopic cholecystectomy.  She understands there is a very small chance for bleeding, infection, injury to normal structures (including common bile duct), conversion to open surgery, persistent symptoms, evolution of postcholecystectomy diarrhea, need for secondary interventions, anesthesia reaction, cardiopulmonary issues and other risks not specifically detailed here. I described the expected recovery, the plan for follow-up and the restrictions during the recovery phase.  All questions were answered. Discussed reasons to go to ED.  She is going to try to change her diet and will reach out if/when she wants to get  her gallbladder removed.   All questions were answered to the satisfaction of the patient.   [name] 11/06/2020, 12:29 PM

## 2020-12-10 ENCOUNTER — Ambulatory Visit: Payer: BC Managed Care – PPO | Admitting: Nurse Practitioner

## 2020-12-10 ENCOUNTER — Other Ambulatory Visit: Payer: Self-pay

## 2020-12-10 ENCOUNTER — Encounter: Payer: Self-pay | Admitting: Nurse Practitioner

## 2020-12-10 VITALS — BP 150/100 | HR 102 | Ht 64.0 in | Wt 211.0 lb

## 2020-12-10 DIAGNOSIS — R300 Dysuria: Secondary | ICD-10-CM | POA: Diagnosis not present

## 2020-12-10 LAB — URINALYSIS, ROUTINE W REFLEX MICROSCOPIC
Bilirubin Urine: NEGATIVE
Glucose, UA: NEGATIVE
Hyaline Cast: NONE SEEN /LPF
Nitrite: POSITIVE — AB
RBC / HPF: >=60 /HPF — AB (ref 0–2)
Specific Gravity, Urine: 1.021 (ref 1.001–1.035)
pH: 6 (ref 5.0–8.0)

## 2020-12-10 LAB — MICROSCOPIC MESSAGE

## 2020-12-10 MED ORDER — CEPHALEXIN 500 MG PO CAPS: 500.0000 mg | ORAL_CAPSULE | Freq: Four times a day (QID) | ORAL | 0 refills | Status: AC

## 2020-12-10 MED ORDER — PHENAZOPYRIDINE HCL 200 MG PO TABS: 200.0000 mg | ORAL_TABLET | Freq: Three times a day (TID) | ORAL | 0 refills | Status: DC | PRN

## 2020-12-10 NOTE — Progress Notes (Signed)
Subjective:    Patient ID: Katie Curtis, female    DOB: February 26, 1980, 40 y.o.   MRN: [id number]  HPI: Katie Curtis is a 40 y.o. female presenting for urinary tract infection symptoms.  Chief Complaint  Patient presents with   Urinary Tract Infection    Dark urine since last night burning strong smell bowels upset    URINARY SYMPTOMS LMP: 12/05/2020 Duration: 1 day Dysuria: yes Urinary frequency: yes Urgency: yes Small volume voids: yes Symptom severity: moderate Urinary incontinence: no Foul odor: yes; stronger than normal  Hematuria: yes Abdominal pain: yes Back pain:  yes; cramping Suprapubic pain/pressure: yes Flank pain: no Fever:  no Chills: no Body aches: no Nausea: no Vomiting: no Relief with cranberry juice: no Relief with pyridium: no Status: better Previous urinary tract infection: no Recurrent urinary tract infection: no Vaginal discharge: no Treatments attempted: cranberry   Allergies  Allergen Reactions   Hydrocodone    Oxycodone    Penicillins Nausea And Vomiting    Outpatient Encounter Medications as of 12/10/2020  Medication Sig   BLISOVI FE 1/20 1-20 MG-MCG tablet TAKE 1 TABLET BY MOUTH DAILY (Patient taking differently: Take 1 tablet by mouth daily.)   cephALEXin (KEFLEX) 500 MG capsule Take 1 capsule (500 mg total) by mouth every 6 (six) hours for 5 days.   Dulaglutide (TRULICITY) A999333 0000000 SOPN Inject 0.75 mg into the skin once a week.   phenazopyridine (PYRIDIUM) 200 MG tablet Take 1 tablet (200 mg total) by mouth 3 (three) times daily as needed for pain.   No facility-administered encounter medications on file as of 12/10/2020.    Patient Active Problem List   Diagnosis Date Noted   Biliary colic 99991111   Chronic cholecystitis 11/06/2020   Urinary incontinence 04/18/2020   BMI 34.0-34.9,adult 04/18/2020   Prediabetes 03/21/2019   Vitamin D deficiency 01/18/2017   ASCUS with positive high risk HPV cervical 01/09/2015     Past Medical History:  Diagnosis Date   Abnormal Pap smear    Anemia    Pilonidal cyst    Prediabetes    Urinary incontinence     Relevant past medical, surgical, family and social history reviewed and updated as indicated. Interim medical history since our last visit reviewed.  Review of Systems Per HPI unless specifically indicated above     Objective:    BP (!) 150/100   Pulse (!) 102   Ht 5\' 4"  (1.626 m)   Wt 211 lb (95.7 kg)   LMP 12/05/2020 (Approximate)   SpO2 93%   BMI 36.22 kg/m   Wt Readings from Last 3 Encounters:  12/10/20 211 lb (95.7 kg)  11/06/20 204 lb (92.5 kg)  10/28/20 204 lb (92.5 kg)    Physical Exam Vitals and nursing note reviewed.  Constitutional:      General: She is not in acute distress.    Appearance: Normal appearance. She is not toxic-appearing.  Abdominal:     General: Abdomen is flat. Bowel sounds are normal. There is no distension.     Palpations: Abdomen is soft.     Tenderness: There is no right CVA tenderness or left CVA tenderness.  Skin:    General: Skin is warm and dry.     Coloration: Skin is not jaundiced or pale.     Findings: No erythema.  Neurological:     Mental Status: She is alert and oriented to person, place, and time.     Motor: No weakness.  Gait: Gait normal.  Psychiatric:        Mood and Affect: Mood normal.        Behavior: Behavior normal.        Thought Content: Thought content normal.        Judgment: Judgment normal.      Assessment & Plan:  1. Dysuria Acute x 1 day.  UA shows trace ketones, 3+ protein, specific gravity 1.021, 3+ blood, nitrite positive, and leukocyte esterase trace.  Microscopic exam revealed 10-20 WBC, >60 RBC, 0-5 squamous epithelial cells, few bacteria.   Treat acute UTI with Keflex 500 mg every 6 hours x 5 days.  Start Pyridium for bladder spasms.  Urine sent for culture.  With nausea/vomiting and unable to keep fluids down, go to ER.   - Urinalysis, Routine w reflex  microscopic - Urine Culture - cephALEXin (KEFLEX) 500 MG capsule; Take 1 capsule (500 mg total) by mouth every 6 (six) hours for 5 days.  Dispense: 20 capsule; Refill: 0 - phenazopyridine (PYRIDIUM) 200 MG tablet; Take 1 tablet (200 mg total) by mouth 3 (three) times daily as needed for pain.  Dispense: 10 tablet; Refill: 0    Follow up plan: Return if symptoms worsen or fail to improve.

## 2020-12-11 NOTE — Telephone Encounter (Signed)
Patient called to request call back from provider to discuss lab results; has some questions/concerns. Please advise at [phone].

## 2020-12-12 ENCOUNTER — Telehealth (INDEPENDENT_AMBULATORY_CARE_PROVIDER_SITE_OTHER): Payer: BC Managed Care – PPO | Admitting: Nurse Practitioner

## 2020-12-12 ENCOUNTER — Other Ambulatory Visit: Payer: Self-pay

## 2020-12-12 ENCOUNTER — Encounter: Payer: Self-pay | Admitting: Nurse Practitioner

## 2020-12-12 DIAGNOSIS — R809 Proteinuria, unspecified: Secondary | ICD-10-CM

## 2020-12-12 LAB — URINE CULTURE
MICRO NUMBER:: 12714215
SPECIMEN QUALITY:: ADEQUATE

## 2020-12-12 NOTE — Telephone Encounter (Signed)
MyChart visit scheduled for today.   Thank you!

## 2020-12-12 NOTE — Progress Notes (Signed)
Subjective:    Patient ID: Katie Curtis, female    DOB: [birth year], 40 y.o.   MRN: [id number]  HPI: Katie Curtis is a 40 y.o. female presenting virtually to discuss lab results.   Chief Complaint  Patient presents with   Discuss lab results   Patient reports concern over level of protein in urine.  Reports father had kidney failure and she googled this after our visit and became very worried.   She reports her urinary symptoms are much improved today.  She did have a low grade fever - reports 100.7 Monday night.    Allergies  Allergen Reactions   Hydrocodone    Oxycodone    Penicillins Nausea And Vomiting    Outpatient Encounter Medications as of 12/12/2020  Medication Sig   BLISOVI FE 1/20 1-20 MG-MCG tablet TAKE 1 TABLET BY MOUTH DAILY (Patient taking differently: Take 1 tablet by mouth daily.)   cephALEXin (KEFLEX) 500 MG capsule Take 1 capsule (500 mg total) by mouth every 6 (six) hours for 5 days.   Dulaglutide (TRULICITY) 0.75 MG/0.5ML SOPN Inject 0.75 mg into the skin once a week.   phenazopyridine (PYRIDIUM) 200 MG tablet Take 1 tablet (200 mg total) by mouth 3 (three) times daily as needed for pain.   No facility-administered encounter medications on file as of 12/12/2020.    Patient Active Problem List   Diagnosis Date Noted   Biliary colic 11/06/2020   Chronic cholecystitis 11/06/2020   Urinary incontinence 04/18/2020   BMI 34.0-34.9,adult 04/18/2020   Prediabetes 03/21/2019   Vitamin D deficiency 01/18/2017   ASCUS with positive high risk HPV cervical 01/09/2015    Past Medical History:  Diagnosis Date   Abnormal Pap smear    Anemia    Pilonidal cyst    Prediabetes    Urinary incontinence     Relevant past medical, surgical, family and social history reviewed and updated as indicated. Interim medical history since our last visit reviewed.  Review of Systems Per HPI unless specifically indicated above     Objective:    LMP 12/05/2020  (Approximate)   Wt Readings from Last 3 Encounters:  12/10/20 211 lb (95.7 kg)  11/06/20 204 lb (92.5 kg)  10/28/20 204 lb (92.5 kg)    Physical Exam Vitals and nursing note reviewed.  Constitutional:      General: She is not in acute distress.    Appearance: Normal appearance. She is not toxic-appearing.  Pulmonary:     Effort: Pulmonary effort is normal. No respiratory distress.     Comments: Unable to assess breath sounds via virtual visit.  Patient talking in complete sentences during telemedicine visit. Skin:    Coloration: Skin is not jaundiced or pale.     Findings: No erythema.  Neurological:     Mental Status: She is alert and oriented to person, place, and time.  Psychiatric:        Mood and Affect: Mood normal.        Thought Content: Thought content normal.        Judgment: Judgment normal.    Results for orders placed or performed in visit on 12/10/20  Urine Culture   Specimen: Urine  Result Value Ref Range   MICRO NUMBER: [id number]    SPECIMEN QUALITY: Adequate    Sample Source NOT GIVEN    STATUS: PRELIMINARY    Result: Culture in progress   Urinalysis, Routine w reflex microscopic  Result Value Ref Range  Color, Urine DARK YELLOW YELLOW   APPearance CLOUDY (A) CLEAR   Specific Gravity, Urine 1.021 1.001 - 1.035   pH 6.0 5.0 - 8.0   Glucose, UA NEGATIVE NEGATIVE   Bilirubin Urine NEGATIVE NEGATIVE   Ketones, ur TRACE (A) NEGATIVE   Hgb urine dipstick 3+ (A) NEGATIVE   Protein, ur 3+ (A) NEGATIVE   Nitrite POSITIVE (A) NEGATIVE   Leukocytes,Ua TRACE (A) NEGATIVE   WBC, UA 10-20 (A) 0 - 5 /HPF   RBC / HPF > OR = 60 (A) 0 - 2 /HPF   Squamous Epithelial / LPF 0-5 < OR = 5 /HPF   Bacteria, UA FEW (A) NONE SEEN /HPF   Hyaline Cast NONE SEEN NONE SEEN /LPF  Microscopic Message  Result Value Ref Range   Note        Assessment & Plan:  1. Proteinuria, unspecified type Acute.  Reassurance provided to the patient.  The patient's urine was very  concentrated on Monday.  Also given the acute urinary tract infection, I believe this explains the abnormal findings on her urinalysis.  We will plan to recheck a urinalysis next week after she finishes antibiotics to make sure this clears up.  If the proteinuria is persistent, we will work this up further.  Of note, she did have a urinalysis in about 1 month ago that did not show any protein in her urine.  She also recently had kidney function performed at the emergency room in October that was normal.      Follow up plan: Return if symptoms worsen or fail to improve.   Due to the catastrophic nature of the COVID-19 pandemic, this video visit was completed soley via audio and visual contact via [facility] due to the restrictions of the COVID-19 pandemic.  All issues as above were discussed and addressed. Physical exam was done as above through visual confirmation on [employer]. If it was felt that the patient should be evaluated in the office, they were directed there. The patient verbally consented to this visit. Location of the patient: work Location of the provider: work Those involved with this call:  Provider: Noemi Chapel, DNP, FNP-C CMA: n/a Front Desk/Registration: Santina Evans  Time spent on call:  8 minutes with patient face to face via video conference. More than 50% of this time was spent in counseling and coordination of care. 15 minutes total spent in review of patient's record and preparation of their chart. I verified patient identity using two factors (patient name and date of birth). Patient consents verbally to being seen via telemedicine visit today.

## 2020-12-12 NOTE — Telephone Encounter (Signed)
Office visit is fine to discuss labs - can be virtual if this is better for patient

## 2020-12-25 ENCOUNTER — Other Ambulatory Visit: Payer: BC Managed Care – PPO

## 2020-12-25 LAB — URINALYSIS, ROUTINE W REFLEX MICROSCOPIC
Bacteria, UA: NONE SEEN /HPF
Bilirubin Urine: NEGATIVE
Casts: NONE SEEN /LPF
Crystals: NONE SEEN /HPF
Glucose, UA: NEGATIVE
Hgb urine dipstick: NEGATIVE
Hyaline Cast: NONE SEEN /LPF
Ketones, ur: NEGATIVE
Leukocytes,Ua: NEGATIVE
Nitrite: NEGATIVE
Protein, ur: NEGATIVE
RBC / HPF: NONE SEEN /HPF (ref 0–2)
Specific Gravity, Urine: 1.025 (ref 1.001–1.035)
WBC, UA: NONE SEEN /HPF (ref 0–5)
Yeast: NONE SEEN /HPF
pH: 6.5 (ref 5.0–8.0)

## 2020-12-27 ENCOUNTER — Other Ambulatory Visit: Payer: Self-pay | Admitting: Obstetrics and Gynecology

## 2020-12-27 DIAGNOSIS — Z3009 Encounter for other general counseling and advice on contraception: Secondary | ICD-10-CM

## 2020-12-27 NOTE — Telephone Encounter (Signed)
Last annual exam was 05/2020 Last Mammogram 09/2019 Annual scheduled on 05/22/2021

## 2020-12-31 ENCOUNTER — Other Ambulatory Visit: Payer: Self-pay | Admitting: Obstetrics and Gynecology

## 2020-12-31 DIAGNOSIS — Z3009 Encounter for other general counseling and advice on contraception: Secondary | ICD-10-CM

## 2020-12-31 NOTE — Telephone Encounter (Signed)
Please contact patient to schedule her follow up visit.  She had a new start on birth control and has not had a follow up appointment.  No further refills without office visit.

## 2021-02-06 ENCOUNTER — Encounter: Payer: Self-pay | Admitting: Nurse Practitioner

## 2021-02-06 DIAGNOSIS — R7303 Prediabetes: Secondary | ICD-10-CM

## 2021-02-06 MED ORDER — TRULICITY 0.75 MG/0.5ML ~~LOC~~ SOAJ: 0.7500 mg | SUBCUTANEOUS | 2 refills | Status: DC

## 2021-02-08 ENCOUNTER — Ambulatory Visit: Payer: BC Managed Care – PPO | Admitting: Nurse Practitioner

## 2021-04-16 DIAGNOSIS — Z0289 Encounter for other administrative examinations: Secondary | ICD-10-CM

## 2021-04-24 ENCOUNTER — Encounter (INDEPENDENT_AMBULATORY_CARE_PROVIDER_SITE_OTHER): Payer: Self-pay | Admitting: Family Medicine

## 2021-04-24 ENCOUNTER — Ambulatory Visit (INDEPENDENT_AMBULATORY_CARE_PROVIDER_SITE_OTHER): Payer: BC Managed Care – PPO | Admitting: Family Medicine

## 2021-04-24 VITALS — BP 126/83 | HR 89 | Temp 98.1°F | Ht 64.0 in | Wt 218.0 lb

## 2021-04-24 DIAGNOSIS — Z6837 Body mass index (BMI) 37.0-37.9, adult: Secondary | ICD-10-CM

## 2021-04-24 DIAGNOSIS — R5383 Other fatigue: Secondary | ICD-10-CM

## 2021-04-24 DIAGNOSIS — E559 Vitamin D deficiency, unspecified: Secondary | ICD-10-CM

## 2021-04-24 DIAGNOSIS — E669 Obesity, unspecified: Secondary | ICD-10-CM | POA: Diagnosis not present

## 2021-04-24 DIAGNOSIS — Z1331 Encounter for screening for depression: Secondary | ICD-10-CM | POA: Diagnosis not present

## 2021-04-24 DIAGNOSIS — R0602 Shortness of breath: Secondary | ICD-10-CM | POA: Diagnosis not present

## 2021-04-24 DIAGNOSIS — R7303 Prediabetes: Secondary | ICD-10-CM | POA: Diagnosis not present

## 2021-04-25 LAB — LIPID PANEL
Chol/HDL Ratio: 3.6 ratio (ref 0.0–4.4)
Cholesterol, Total: 194 mg/dL (ref 100–199)
HDL: 54 mg/dL (ref >39)
LDL Chol Calc (NIH): 126 mg/dL — ABNORMAL HIGH (ref 0–99)
Triglycerides: 76 mg/dL (ref 0–149)
VLDL Cholesterol Cal: 14 mg/dL (ref 5–40)

## 2021-04-25 LAB — COMPREHENSIVE METABOLIC PANEL
ALT: 22 IU/L (ref 0–32)
AST: 18 IU/L (ref 0–40)
Albumin/Globulin Ratio: 1.6 (ref 1.2–2.2)
Albumin: 4.7 g/dL (ref 3.8–4.8)
Alkaline Phosphatase: 92 IU/L (ref 44–121)
BUN/Creatinine Ratio: 18 (ref 9–23)
BUN: 12 mg/dL (ref 6–24)
Bilirubin Total: 0.3 mg/dL (ref 0.0–1.2)
CO2: 16 mmol/L — ABNORMAL LOW (ref 20–29)
Calcium: 9.5 mg/dL (ref 8.7–10.2)
Chloride: 102 mmol/L (ref 96–106)
Creatinine, Ser: 0.65 mg/dL (ref 0.57–1.00)
Globulin, Total: 2.9 g/dL (ref 1.5–4.5)
Glucose: 103 mg/dL — ABNORMAL HIGH (ref 70–99)
Potassium: 3.9 mmol/L (ref 3.5–5.2)
Sodium: 141 mmol/L (ref 134–144)
Total Protein: 7.6 g/dL (ref 6.0–8.5)
eGFR: 112 mL/min/{1.73_m2} (ref >59)

## 2021-04-25 LAB — CBC WITH DIFFERENTIAL/PLATELET
Basophils Absolute: 0 10*3/uL (ref 0.0–0.2)
Basos: 0 %
EOS (ABSOLUTE): 0.3 10*3/uL (ref 0.0–0.4)
Eos: 3 %
Hematocrit: 37.4 % (ref 34.0–46.6)
Hemoglobin: 12.2 g/dL (ref 11.1–15.9)
Immature Grans (Abs): 0 10*3/uL (ref 0.0–0.1)
Immature Granulocytes: 0 %
Lymphocytes Absolute: 2.3 10*3/uL (ref 0.7–3.1)
Lymphs: 23 %
MCH: 28.2 pg (ref 26.6–33.0)
MCHC: 32.6 g/dL (ref 31.5–35.7)
MCV: 86 fL (ref 79–97)
Monocytes Absolute: 0.6 10*3/uL (ref 0.1–0.9)
Monocytes: 6 %
Neutrophils Absolute: 6.7 10*3/uL (ref 1.4–7.0)
Neutrophils: 68 %
Platelets: 381 10*3/uL (ref 150–450)
RBC: 4.33 x10E6/uL (ref 3.77–5.28)
RDW: 13.4 % (ref 11.7–15.4)
WBC: 10 10*3/uL (ref 3.4–10.8)

## 2021-04-25 LAB — T3: T3, Total: 146 ng/dL (ref 71–180)

## 2021-04-25 LAB — HEMOGLOBIN A1C
Est. average glucose Bld gHb Est-mCnc: 123 mg/dL
Hgb A1c MFr Bld: 5.9 % — ABNORMAL HIGH (ref 4.8–5.6)

## 2021-04-25 LAB — FOLATE: Folate: 2.9 ng/mL — ABNORMAL LOW (ref >3.0)

## 2021-04-25 LAB — TSH: TSH: 1.52 u[IU]/mL (ref 0.450–4.500)

## 2021-04-25 LAB — VITAMIN B12: Vitamin B-12: 447 pg/mL (ref 232–1245)

## 2021-04-25 LAB — INSULIN, RANDOM: INSULIN: 32.8 u[IU]/mL — ABNORMAL HIGH (ref 2.6–24.9)

## 2021-04-25 LAB — T4, FREE: Free T4: 1.07 ng/dL (ref 0.82–1.77)

## 2021-04-25 LAB — VITAMIN D 25 HYDROXY (VIT D DEFICIENCY, FRACTURES): Vit D, 25-Hydroxy: 10.7 ng/mL — ABNORMAL LOW (ref 30.0–100.0)

## 2021-05-08 ENCOUNTER — Encounter (INDEPENDENT_AMBULATORY_CARE_PROVIDER_SITE_OTHER): Payer: Self-pay | Admitting: Family Medicine

## 2021-05-08 ENCOUNTER — Ambulatory Visit (INDEPENDENT_AMBULATORY_CARE_PROVIDER_SITE_OTHER): Payer: BC Managed Care – PPO | Admitting: Family Medicine

## 2021-05-08 VITALS — BP 117/83 | HR 100 | Temp 98.2°F | Ht 64.0 in | Wt 213.0 lb

## 2021-05-08 DIAGNOSIS — E669 Obesity, unspecified: Secondary | ICD-10-CM | POA: Diagnosis not present

## 2021-05-08 DIAGNOSIS — Z6836 Body mass index (BMI) 36.0-36.9, adult: Secondary | ICD-10-CM

## 2021-05-08 DIAGNOSIS — E559 Vitamin D deficiency, unspecified: Secondary | ICD-10-CM

## 2021-05-08 DIAGNOSIS — E7849 Other hyperlipidemia: Secondary | ICD-10-CM

## 2021-05-08 DIAGNOSIS — E66811 Obesity, class 1: Secondary | ICD-10-CM

## 2021-05-08 DIAGNOSIS — R7303 Prediabetes: Secondary | ICD-10-CM

## 2021-05-08 MED ORDER — VITAMIN D (ERGOCALCIFEROL) 1.25 MG (50000 UNIT) PO CAPS: 50000.0000 [IU] | ORAL_CAPSULE | ORAL | 0 refills | Status: DC

## 2021-05-08 MED ORDER — TRULICITY 1.5 MG/0.5ML ~~LOC~~ SOAJ: 1.5000 mg | SUBCUTANEOUS | 0 refills | Status: DC

## 2021-05-14 NOTE — Progress Notes (Deleted)
41 y.o. G1P1 Single African American female here for annual exam.    PCP: Noemi Chapel, NP    No LMP recorded.           Sexually active: {yes no:314532}  The current method of family planning is {contraception:315051}.    Exercising: {yes no:314532}  {types:19826} Smoker:  no  Health Maintenance: Pap: 05/17/20-WNL, HPV- neg, 03/21/19-WNL, HPV- neg, 03/19/18-WNL, HPV- neg History of abnormal Pap:  yes, 02-19-17 colpo revealed CIN 1 and Neg ECC;01-22-17 Neg:Pos HR HPV. 01-09-15 colpo revealed Neg ECC;12-28-14 ASCUS:Pos HR HPV. LEEP 2011. CIN 1 MMG: 09/26/19- birads 0; 10/06/19-Diag/US Lt Breast- neg birads 1 Colonoscopy: n/a BMD: n/a  Result n/a TDaP: 08/29/12 Gardasil:   no HIV: neg in past Hep C: neg in past  Screening Labs:  Hb today: ***, Urine today: ***   reports that she has never smoked. She has never used smokeless tobacco. She reports current alcohol use. She reports that she does not use drugs.  Past Medical History:  Diagnosis Date   Abnormal Pap smear    Anemia    Anemia    Gallbladder problem    Pilonidal cyst    Prediabetes    SOBOE (shortness of breath on exertion)    Urinary incontinence    Vitamin D deficiency     Past Surgical History:  Procedure Laterality Date   EYE SURGERY  1996   LEEP     CIN II    Current Outpatient Medications  Medication Sig Dispense Refill   Dulaglutide (TRULICITY) 1.5 0000000 SOPN Inject 1.5 mg into the skin once a week. 2 mL 0   Vitamin D, Ergocalciferol, (DRISDOL) 1.25 MG (50000 UNIT) CAPS capsule Take 1 capsule (50,000 Units total) by mouth every 7 (seven) days. 4 capsule 0   No current facility-administered medications for this visit.    Family History  Problem Relation Age of Onset   Depression Mother    Sleep apnea Mother    Alcoholism Mother    Hypertension Father    Congestive Heart Failure Father        had been on dialysis x 10 years   Diabetes Father    High Cholesterol Father    Heart disease Father     Kidney disease Father    Cancer Father    Obesity Father    Stroke Maternal Grandmother    Lung cancer Paternal Grandmother        smoker   Hypertension Paternal Grandmother    Hypothyroidism Paternal Grandmother    Hypertension Paternal Grandfather    Kidney failure Paternal Grandfather     Review of Systems  Exam:   There were no vitals taken for this visit.    General appearance: alert, cooperative and appears stated age Head: normocephalic, without obvious abnormality, atraumatic Neck: no adenopathy, supple, symmetrical, trachea midline and thyroid normal to inspection and palpation Lungs: clear to auscultation bilaterally Breasts: normal appearance, no masses or tenderness, No nipple retraction or dimpling, No nipple discharge or bleeding, No axillary adenopathy Heart: regular rate and rhythm Abdomen: soft, non-tender; no masses, no organomegaly Extremities: extremities normal, atraumatic, no cyanosis or edema Skin: skin color, texture, turgor normal. No rashes or lesions Lymph nodes: cervical, supraclavicular, and axillary nodes normal. Neurologic: grossly normal  Pelvic: External genitalia:  no lesions              No abnormal inguinal nodes palpated.              Urethra:  normal appearing urethra with no masses, tenderness or lesions              Bartholins and Skenes: normal                 Vagina: normal appearing vagina with normal color and discharge, no lesions              Cervix: no lesions              Pap taken: {yes no:314532} Bimanual Exam:  Uterus:  normal size, contour, position, consistency, mobility, non-tender              Adnexa: no mass, fullness, tenderness              Rectal exam: {yes no:314532}.  Confirms.              Anus:  normal sphincter tone, no lesions  Chaperone was present for exam:  ***  Assessment:   Well woman visit with gynecologic exam.   Plan: Mammogram screening discussed. Self breast awareness reviewed. Pap and HR HPV  as above. Guidelines for Calcium, Vitamin D, regular exercise program including cardiovascular and weight bearing exercise.   Follow up annually and prn.   Additional counseling given.  {yes [id number]. _______ minutes face to face time of which over 50% was spent in counseling.    After visit summary provided.

## 2021-05-14 NOTE — Progress Notes (Signed)
? ? ? ? ?Chief Complaint:  ? ?OBESITY ?Katie Curtis (MR# [id number]) is a 41 y.o. female who presents for evaluation and treatment of obesity and related comorbidities. Current BMI is Body mass index is 37.42 kg/m?Katie Curtis has been struggling with her weight for many years and has been unsuccessful in either losing weight, maintaining weight loss, or reaching her healthy weight goal. ? ?Katie Curtis is a retaining patient to our clinic after being away for approximately 3 years. ? ?Katie Curtis is currently in the action stage of change and ready to dedicate time achieving and maintaining a healthier weight. Katie Curtis is interested in becoming our patient and working on intensive lifestyle modifications including (but not limited to) diet and exercise for weight loss. ? ?Katie Curtis habits were reviewed today and are as follows: her desired weight loss is 38 lbs, she started gaining weight after COVID and after 41 years old, her heaviest weight ever was 220 pounds, she has significant food cravings issues, she snacks frequently in the evenings, she wakes up frequently in the middle of the night to eat, she skips meals frequently, she is frequently drinking liquids with calories, she frequently makes poor food choices, she has problems with excessive hunger, she frequently eats larger portions than normal, and she struggles with emotional eating. ? ?Depression Screen ?Katie Curtis's Food and Mood (modified PHQ-9) score was 16. ? ? ?  04/24/2021  ?  7:29 AM  ?Depression screen PHQ 2/9  ?Decreased Interest 2  ?Down, Depressed, Hopeless 2  ?PHQ - 2 Score 4  ?Altered sleeping 2  ?Tired, decreased energy 2  ?Change in appetite 3  ?Feeling bad or failure about yourself  2  ?Trouble concentrating 2  ?Moving slowly or fidgety/restless 0  ?Suicidal thoughts 1  ?PHQ-9 Score 16  ?Difficult doing work/chores Not difficult at all  ? ?Subjective:  ? ?1. Other fatigue ?Katie Curtis denies daytime somnolence and admits to waking up still tired. Patient has a  history of symptoms of morning fatigue. Montzerrat generally gets 6 hours of sleep per night, and states that she has nightime awakenings. Snoring is not present. Apneic episodes are not present. Epworth Sleepiness Score is 0.  ? ?2. SOBOE (shortness of breath on exertion) ?Katie Curtis notes increasing shortness of breath with exercising and seems to be worsening over time with weight gain. She notes getting out of breath sooner with activity than she used to. This has not gotten worse recently. Mishika denies shortness of breath at rest or orthopnea. ? ?3. Pre-diabetes ?Katie Curtis is working on weight loss and she is on Science writer. ? ?4. Vitamin D deficiency ?Katie Curtis has a history of Vitamin D deficiency, and she notes fatigue.  ? ?Assessment/Plan:  ? ?1. Other fatigue ?Katie Curtis does feel that her weight is causing her energy to be lower than it should be. Fatigue may be related to obesity, depression or many other causes. Labs will be ordered, and in the meanwhile, Elmarie will focus on self care including making healthy food choices, increasing physical activity and focusing on stress reduction. ? ?- Vitamin B12 ?- CBC with Differential/Platelet ?- Folate ?- Lipid panel ?- T3 ?- T4, free ?- TSH ? ?2. SOBOE (shortness of breath on exertion) ?Katie Curtis does feel that she gets out of breath more easily that she used to when she exercises. Katie Curtis shortness of breath appears to be obesity related and exercise induced. She has agreed to work on weight loss and gradually increase exercise to treat her exercise induced shortness of breath. Will  continue to monitor closely. ? ?3. Pre-diabetes ?We will check labs today. Demeka will start her Category 2 plan, and will follow. We may consider changing GLP-1 to another version with better weight loss.  ? ?- Comprehensive metabolic panel ?- Hemoglobin A1c ?- Insulin, random ? ?4. Vitamin D deficiency ?We will check labs today. Katie Curtis will follow-up for routine testing of Vitamin D, at least 2-3  times per year to avoid over-replacement. ? ?- VITAMIN D 25 Hydroxy (Vit-D Deficiency, Fractures) ? ?5. Depression screening ?Katie Curtis had a positive depression screening. Depression is commonly associated with obesity and often results in emotional eating behaviors. We will monitor this closely and work on CBT to help improve the non-hunger eating patterns. Referral to Psychology may be required if no improvement is seen as she continues in our clinic. ? ?6. Obesity, current BMI 37.6 ?Katie Curtis is currently in the action stage of change and her goal is to continue with weight loss efforts. I recommend Katie Curtis begin the structured treatment plan as follows: ? ?She has agreed to the Category 2 Plan + 100 calories. ? ?Exercise goals: No exercise has been prescribed for now, while we concentrate on nutritional changes. ? ?Behavioral modification strategies: decreasing simple carbohydrates and no skipping meals. ? ?She was informed of the importance of frequent follow-up visits to maximize her success with intensive lifestyle modifications for her multiple health conditions. She was informed we would discuss her lab results at her next visit unless there is a critical issue that needs to be addressed sooner. Katie Curtis agreed to keep her next visit at the agreed upon time to discuss these results. ? ?Objective:  ? ?Blood pressure 126/83, pulse 89, temperature 98.1 ?F (36.7 ?C), height 5\' 4"  (1.626 m), weight 218 lb (98.9 kg), SpO2 98 %. Body mass index is 37.42 kg/m?. ? ?EKG: Normal sinus rhythm, rate (unable to obtain). ? ?Indirect Calorimeter completed today shows a VO2 of 236 and a REE of 1627.  Her calculated basal metabolic rate is thus her basal metabolic rate is worse than expected. ? ?General: Cooperative, alert, well developed, in no acute distress. ?HEENT: Conjunctivae and lids unremarkable. ?Cardiovascular: Regular rhythm.  ?Lungs: Normal work of breathing. ?Neurologic: No focal deficits.  ? ?Lab Results   ?Component Value Date  ? CREATININE 0.65 04/24/2021  ? BUN 12 04/24/2021  ? NA 141 04/24/2021  ? K 3.9 04/24/2021  ? CL 102 04/24/2021  ? CO2 16 (L) 04/24/2021  ? ?Lab Results  ?Component Value Date  ? ALT 22 04/24/2021  ? AST 18 04/24/2021  ? ALKPHOS 92 04/24/2021  ? BILITOT 0.3 04/24/2021  ? ?Lab Results  ?Component Value Date  ? HGBA1C 5.9 (H) 04/24/2021  ? HGBA1C 6.1 (H) 04/17/2020  ? HGBA1C 5.7 (H) 03/21/2019  ? HGBA1C 5.7 (H) 06/29/2018  ? ?Lab Results  ?Component Value Date  ? INSULIN 32.8 (H) 04/24/2021  ? INSULIN 13.1 06/29/2018  ? ?Lab Results  ?Component Value Date  ? TSH 1.520 04/24/2021  ? ?Lab Results  ?Component Value Date  ? CHOL 194 04/24/2021  ? HDL 54 04/24/2021  ? LDLCALC 126 (H) 04/24/2021  ? TRIG 76 04/24/2021  ? CHOLHDL 3.6 04/24/2021  ? ?Lab Results  ?Component Value Date  ? WBC 10.0 04/24/2021  ? HGB 12.2 04/24/2021  ? HCT 37.4 04/24/2021  ? MCV 86 04/24/2021  ? PLT 381 04/24/2021  ? ?Lab Results  ?Component Value Date  ? FERRITIN 23 03/19/2018  ? ?Attestation Statements:  ? ?Reviewed  by clinician on day of visit: allergies, medications, problem list, medical history, surgical history, family history, social history, and previous encounter notes. ? ?Time spent on visit including pre-visit chart review and post-visit charting and care was 45 minutes.  ? ? ?I, Burt KnackSharon Martin, am acting as transcriptionist for Quillian Quincearen Derra Shartzer, MD. ? ?I have reviewed the above documentation for accuracy and completeness, and I agree with the above. - Quillian Quincearen Jonet Mathies, MD ? ? ?

## 2021-05-22 ENCOUNTER — Ambulatory Visit: Payer: BC Managed Care – PPO | Admitting: Obstetrics and Gynecology

## 2021-05-22 NOTE — Progress Notes (Signed)
Chief Complaint:   OBESITY Katie Curtis is here to discuss her progress with her obesity treatment plan along with follow-up of her obesity related diagnoses. Katie Curtis is on the Category 2 Plan + 100 calories and states she is following her eating plan approximately (unknown)% of the time. Katie Curtis states she is doing 0 minutes 0 times per week.  Today's visit was #: 2 Starting weight: 218 lbs Starting date: 04/24/2021 Today's weight: 213 lbs Today's date: 05/08/2021 Total lbs lost to date: 5 Total lbs lost since last in-office visit: 5  Interim History: Katie Curtis did well with weight loss on her Category 2 plan. Her hunger is mostly controlled and she is tolerating her plan well, but she is getting bored with lunch.  Subjective:   1. Vitamin D deficiency Katie Curtis's Vitamin D level is worsening, and is low again. Not yet at goal. I discussed labs with the patient today.  2. Pre-diabetes Katie Curtis's A1c is worsening and is elevated despite being on Trulicity. She denies nausea or vomiting. I discussed labs with the patient today.  3. Other hyperlipidemia Katie Curtis's LDL is elevated. She is not on a statin, and she is working on her diet and weight loss. I discussed labs with the patient today.  Assessment/Plan:   1. Vitamin D deficiency Low Vitamin D level contributes to fatigue and are associated with obesity, breast, and colon cancer. Katie Curtis agreed to start prescription Vitamin D 50,000 IU every week with no refills. She will follow-up for routine testing of Vitamin D, at least 2-3 times per year to avoid over-replacement.  - Vitamin D, Ergocalciferol, (DRISDOL) 1.25 MG (50000 UNIT) CAPS capsule; Take 1 capsule (50,000 Units total) by mouth every 7 (seven) days.  Dispense: 4 capsule; Refill: 0  2. Pre-diabetes Katie Curtis agreed to increase Trulicity to 1.5 mg q week with no refills, and we will follow up at her next visit. She will continue to work on weight loss, exercise, and decreasing simple  carbohydrates to help decrease the risk of diabetes.   - Dulaglutide (TRULICITY) 1.5 0000000 SOPN; Inject 1.5 mg into the skin once a week.  Dispense: 2 mL; Refill: 0  3. Other hyperlipidemia Cardiovascular risk and specific lipid/LDL goals reviewed.  We discussed several lifestyle modifications today. Katie Curtis will continue her diet, exercise ,and weight loss efforts. We will recheck labs in 3 months. Orders and follow up as documented in patient record.   4. Obesity, Current BMI 36.6 Katie Curtis is currently in the action stage of change. As such, her goal is to continue with weight loss efforts. She has agreed to the Category 2 Plan + 100 calories with lunch options.   Behavioral modification strategies: no skipping meals.  Katie Curtis has agreed to follow-up with our clinic in 2 weeks. She was informed of the importance of frequent follow-up visits to maximize her success with intensive lifestyle modifications for her multiple health conditions.   Objective:   Blood pressure 117/83, pulse 100, temperature 98.2 F (36.8 C), height 5\' 4"  (1.626 m), weight 213 lb (96.6 kg), SpO2 97 %. Body mass index is 36.56 kg/m.  General: Cooperative, alert, well developed, in no acute distress. HEENT: Conjunctivae and lids unremarkable. Cardiovascular: Regular rhythm.  Lungs: Normal work of breathing. Neurologic: No focal deficits.   Lab Results  Component Value Date   CREATININE 0.65 04/24/2021   BUN 12 04/24/2021   NA 141 04/24/2021   K 3.9 04/24/2021   CL 102 04/24/2021   CO2 16 (L) 04/24/2021  Lab Results  Component Value Date   ALT 22 04/24/2021   AST 18 04/24/2021   ALKPHOS 92 04/24/2021   BILITOT 0.3 04/24/2021   Lab Results  Component Value Date   HGBA1C 5.9 (H) 04/24/2021   HGBA1C 6.1 (H) 04/17/2020   HGBA1C 5.7 (H) 03/21/2019   HGBA1C 5.7 (H) 06/29/2018   Lab Results  Component Value Date   INSULIN 32.8 (H) 04/24/2021   INSULIN 13.1 06/29/2018   Lab Results  Component  Value Date   TSH 1.520 04/24/2021   Lab Results  Component Value Date   CHOL 194 04/24/2021   HDL 54 04/24/2021   LDLCALC 126 (H) 04/24/2021   TRIG 76 04/24/2021   CHOLHDL 3.6 04/24/2021   Lab Results  Component Value Date   VD25OH 10.7 (L) 04/24/2021   VD25OH 14 (L) 04/17/2020   VD25OH 22.3 (L) 06/29/2018   Lab Results  Component Value Date   WBC 10.0 04/24/2021   HGB 12.2 04/24/2021   HCT 37.4 04/24/2021   MCV 86 04/24/2021   PLT 381 04/24/2021   Lab Results  Component Value Date   FERRITIN 23 03/19/2018   Attestation Statements:   Reviewed by clinician on day of visit: allergies, medications, problem list, medical history, surgical history, family history, social history, and previous encounter notes.  Time spent on visit including pre-visit chart review and post-visit care and charting was 40 minutes.   I, Trixie Dredge, am acting as transcriptionist for Dennard Nip, MD.  I have reviewed the above documentation for accuracy and completeness, and I agree with the above. -  Dennard Nip, MD

## 2021-05-22 NOTE — Progress Notes (Signed)
TeleHealth Visit:  This visit was completed with telemedicine (audio/video) technology. Katie Curtis has verbally consented to this TeleHealth visit. The patient is located at home, the provider is located at home. The participants in this visit include the listed provider and patient. The visit was conducted today via MyChart video.  OBESITY Katie Curtis is here to discuss her progress with her obesity treatment plan along with follow-up of her obesity related diagnoses.   Today's visit was # 3 Starting weight: 218 lbs Starting date: 04/24/21 Weight at last in office visit: 213 lbs on 05/08/21 Total weight loss: 5 lbs at last in office visit on 05/08/21. Today's reported weight: 211 lbs    Nutrition Plan: the Category 2 Plan plus 100 calories- adhered 80-90%. Hunger is well controlled. Cravings are well controlled.  Current exercise: none  Interim History: Katie Curtis feels good about how things are going with the plan.  According to her home scale she has lost 2 pounds.  Has had some special occasions recently but made good choices.  She has cut out soda since restarting it at our clinic. She has an upcoming trip to [facility] and she will be going on a dinner cruise. She has yogurt for breakfast but usually does not have the toast with it. She would like to alternate virtual and in office visits.  Assessment/Plan:  1. Prediabetes Katie Curtis has a diagnosis of prediabetes based on her elevated HgA1c. She denies polyphagia. Medication(s): Trulicity 1.5 mg weekly.  Dose increased at last office visit.  Denies nausea, constipation. Lab Results  Component Value Date   HGBA1C 5.9 (H) 04/24/2021   Lab Results  Component Value Date   INSULIN 32.8 (H) 04/24/2021   INSULIN 13.1 06/29/2018    Plan: Continue Trulicity at 1.5 mg dose.  2. Vitamin D Deficiency Vitamin D is very low at 10.7.  She is on weekly prescription Vitamin D 50,000 IU.  Lab Results  Component Value Date    VD25OH 10.7 (L) 04/24/2021   VD25OH 14 (L) 04/17/2020   VD25OH 22.3 (L) 06/29/2018    Plan: Continue prescription vitamin D 50,000 IU weekly.   3. Obesity: Current BMI 36.6 Katie Curtis is currently in the action stage of change. As such, her goal is to continue with weight loss efforts.  She has agreed to the Category 2 Plan plus 100 calories.  Exercise goals: No exercise has been prescribed at this time.  Behavioral modification strategies: increasing lean protein intake, meal planning and cooking strategies, and travel eating strategies.  We discussed adding another source of protein as a snack since she only has yogurt for breakfast. She will add 1 protein equivalent choice when having only yogurt for breakfast. Protein equivalent sent via MyChart.  Katie Curtis has agreed to follow-up with our clinic in 2 weeks.   No orders of the defined types were placed in this encounter.   There are no discontinued medications.   No orders of the defined types were placed in this encounter.     Objective:   VITALS: Per patient if applicable, see vitals. GENERAL: Alert and in no acute distress. CARDIOPULMONARY: No increased WOB. Speaking in clear sentences.  PSYCH: Pleasant and cooperative. Speech normal rate and rhythm. Affect is appropriate. Insight and judgement are appropriate. Attention is focused, linear, and appropriate.  NEURO: Oriented as arrived to appointment on time with no prompting.   Lab Results  Component Value Date   CREATININE 0.65 04/24/2021   BUN 12 04/24/2021   NA 141 04/24/2021  K 3.9 04/24/2021   CL 102 04/24/2021   CO2 16 (L) 04/24/2021   Lab Results  Component Value Date   ALT 22 04/24/2021   AST 18 04/24/2021   ALKPHOS 92 04/24/2021   BILITOT 0.3 04/24/2021   Lab Results  Component Value Date   HGBA1C 5.9 (H) 04/24/2021   HGBA1C 6.1 (H) 04/17/2020   HGBA1C 5.7 (H) 03/21/2019   HGBA1C 5.7 (H) 06/29/2018   Lab Results  Component Value Date   INSULIN  32.8 (H) 04/24/2021   INSULIN 13.1 06/29/2018   Lab Results  Component Value Date   TSH 1.520 04/24/2021   Lab Results  Component Value Date   CHOL 194 04/24/2021   HDL 54 04/24/2021   LDLCALC 126 (H) 04/24/2021   TRIG 76 04/24/2021   CHOLHDL 3.6 04/24/2021   Lab Results  Component Value Date   WBC 10.0 04/24/2021   HGB 12.2 04/24/2021   HCT 37.4 04/24/2021   MCV 86 04/24/2021   PLT 381 04/24/2021   Lab Results  Component Value Date   FERRITIN 23 03/19/2018   Lab Results  Component Value Date   VD25OH 10.7 (L) 04/24/2021   VD25OH 14 (L) 04/17/2020   VD25OH 22.3 (L) 06/29/2018    Attestation Statements:   Reviewed by clinician on day of visit: allergies, medications, problem list, medical history, surgical history, family history, social history, and previous encounter notes.  Time spent on visit including pre-visit chart review and post-visit charting and care was 31 minutes.

## 2021-05-23 ENCOUNTER — Telehealth (INDEPENDENT_AMBULATORY_CARE_PROVIDER_SITE_OTHER): Payer: BC Managed Care – PPO | Admitting: Family Medicine

## 2021-05-23 ENCOUNTER — Encounter (INDEPENDENT_AMBULATORY_CARE_PROVIDER_SITE_OTHER): Payer: Self-pay | Admitting: Family Medicine

## 2021-05-23 DIAGNOSIS — E669 Obesity, unspecified: Secondary | ICD-10-CM | POA: Diagnosis not present

## 2021-05-23 DIAGNOSIS — Z6836 Body mass index (BMI) 36.0-36.9, adult: Secondary | ICD-10-CM

## 2021-05-23 DIAGNOSIS — R7303 Prediabetes: Secondary | ICD-10-CM

## 2021-05-23 DIAGNOSIS — E559 Vitamin D deficiency, unspecified: Secondary | ICD-10-CM | POA: Diagnosis not present

## 2021-06-03 ENCOUNTER — Other Ambulatory Visit (INDEPENDENT_AMBULATORY_CARE_PROVIDER_SITE_OTHER): Payer: Self-pay | Admitting: Family Medicine

## 2021-06-03 DIAGNOSIS — E559 Vitamin D deficiency, unspecified: Secondary | ICD-10-CM

## 2021-06-06 ENCOUNTER — Ambulatory Visit (INDEPENDENT_AMBULATORY_CARE_PROVIDER_SITE_OTHER): Payer: BC Managed Care – PPO | Admitting: Family Medicine

## 2021-06-06 ENCOUNTER — Encounter (INDEPENDENT_AMBULATORY_CARE_PROVIDER_SITE_OTHER): Payer: Self-pay | Admitting: Family Medicine

## 2021-06-06 VITALS — BP 120/78 | HR 85 | Temp 98.4°F | Ht 64.0 in | Wt 208.0 lb

## 2021-06-06 DIAGNOSIS — E559 Vitamin D deficiency, unspecified: Secondary | ICD-10-CM

## 2021-06-06 DIAGNOSIS — R7303 Prediabetes: Secondary | ICD-10-CM | POA: Diagnosis not present

## 2021-06-06 DIAGNOSIS — E669 Obesity, unspecified: Secondary | ICD-10-CM | POA: Diagnosis not present

## 2021-06-06 DIAGNOSIS — Z6835 Body mass index (BMI) 35.0-35.9, adult: Secondary | ICD-10-CM

## 2021-06-06 DIAGNOSIS — Z7985 Long-term (current) use of injectable non-insulin antidiabetic drugs: Secondary | ICD-10-CM

## 2021-06-06 MED ORDER — TRULICITY 1.5 MG/0.5ML ~~LOC~~ SOAJ: 1.5000 mg | SUBCUTANEOUS | 0 refills | Status: DC

## 2021-06-06 MED ORDER — VITAMIN D (ERGOCALCIFEROL) 1.25 MG (50000 UNIT) PO CAPS: 50000.0000 [IU] | ORAL_CAPSULE | ORAL | 0 refills | Status: DC

## 2021-06-12 NOTE — Progress Notes (Signed)
Chief Complaint:   OBESITY Katie Curtis is here to discuss her progress with her obesity treatment plan along with follow-up of her obesity related diagnoses. Katie Curtis is on the Category 2 Plan + 100 calories and states she is following her eating plan approximately 90% of the time. Katie Curtis states she is doing 0 minutes 0 times per week.  Today's visit was #: 4 Starting weight: 218 lbs Starting date: 04/24/2021 Today's weight: 208 lbs Today's date: 06/06/2021 Total lbs lost to date: 10 Total lbs lost since last in-office visit: 5  Interim History: Katie Curtis has done well with weight loss despite extra challenges since her last visit in office. She did well with making better choices and planning ahead.   Subjective:   1. Pre-diabetes Katie Curtis continues to work on decreasing simple carbohydrates. She notes decreased polyphagia.   2. Vitamin D deficiency Katie Curtis's last Vitamin D level was low. She is on Vitamin D prescription.   Assessment/Plan:   1. Pre-diabetes We will refill Trulicity 1.5 mg for 1 month. Katie Curtis will continue to work on weight loss, exercise, and decreasing simple carbohydrates to help decrease the risk of diabetes.   - Dulaglutide (TRULICITY) 1.5 MG/0.5ML SOPN; Inject 1.5 mg into the skin once a week.  Dispense: 2 mL; Refill: 0  2. Vitamin D deficiency We will refill prescription Vitamin D for 1 month. Katie Curtis will follow-up for routine testing of Vitamin D, at least 2-3 times per year to avoid over-replacement.  - Vitamin D, Ergocalciferol, (DRISDOL) 1.25 MG (50000 UNIT) CAPS capsule; Take 1 capsule (50,000 Units total) by mouth every 7 (seven) days.  Dispense: 4 capsule; Refill: 0  3. Obesity, Current BMI 35.8 Katie Curtis is currently in the action stage of change. As such, her goal is to continue with weight loss efforts. She has agreed to the Category 2 Plan + 100 calories.   Behavioral modification strategies: increasing lean protein intake.  Katie Curtis has agreed to  follow-up with our clinic in 2 weeks. She was informed of the importance of frequent follow-up visits to maximize her success with intensive lifestyle modifications for her multiple health conditions.   Objective:   Blood pressure 120/78, pulse 85, temperature 98.4 F (36.9 C), height 5\' 4"  (1.626 m), weight 208 lb (94.3 kg), SpO2 100 %. Body mass index is 35.7 kg/m.  General: Cooperative, alert, well developed, in no acute distress. HEENT: Conjunctivae and lids unremarkable. Cardiovascular: Regular rhythm.  Lungs: Normal work of breathing. Neurologic: No focal deficits.   Lab Results  Component Value Date   CREATININE 0.65 04/24/2021   BUN 12 04/24/2021   NA 141 04/24/2021   K 3.9 04/24/2021   CL 102 04/24/2021   CO2 16 (L) 04/24/2021   Lab Results  Component Value Date   ALT 22 04/24/2021   AST 18 04/24/2021   ALKPHOS 92 04/24/2021   BILITOT 0.3 04/24/2021   Lab Results  Component Value Date   HGBA1C 5.9 (H) 04/24/2021   HGBA1C 6.1 (H) 04/17/2020   HGBA1C 5.7 (H) 03/21/2019   HGBA1C 5.7 (H) 06/29/2018   Lab Results  Component Value Date   INSULIN 32.8 (H) 04/24/2021   INSULIN 13.1 06/29/2018   Lab Results  Component Value Date   TSH 1.520 04/24/2021   Lab Results  Component Value Date   CHOL 194 04/24/2021   HDL 54 04/24/2021   LDLCALC 126 (H) 04/24/2021   TRIG 76 04/24/2021   CHOLHDL 3.6 04/24/2021   Lab Results  Component Value  Date   VD25OH 10.7 (L) 04/24/2021   VD25OH 14 (L) 04/17/2020   VD25OH 22.3 (L) 06/29/2018   Lab Results  Component Value Date   WBC 10.0 04/24/2021   HGB 12.2 04/24/2021   HCT 37.4 04/24/2021   MCV 86 04/24/2021   PLT 381 04/24/2021   Lab Results  Component Value Date   FERRITIN 23 03/19/2018   Attestation Statements:   Reviewed by clinician on day of visit: allergies, medications, problem list, medical history, surgical history, family history, social history, and previous encounter notes.   I, Burt Knack,  am acting as transcriptionist for Quillian Quince, MD.  I have reviewed the above documentation for accuracy and completeness, and I agree with the above. -  Quillian Quince, MD

## 2021-06-23 NOTE — Progress Notes (Unsigned)
TeleHealth Visit:  This visit was completed with telemedicine (audio/video) technology. Katie Curtis has verbally consented to this TeleHealth visit. The patient is located at home, the provider is located at home. The participants in this visit include the listed provider and patient. The visit was conducted today via MyChart video.  OBESITY Katie Curtis is here to discuss her progress with her obesity treatment plan along with follow-up of her obesity related diagnoses.   Today's visit was # 5 Starting weight: 218 lbs Starting date: 04/24/2021 Weight at last in office visit: 208 lbs on 06/06/21 Total weight loss: 10 lbs at last in office visit on 06/06/21. Today's reported weight: *** lbs No weight reported.  Nutrition Plan: the Category 2 Plan plus 100 calories Hunger is {EWCONTROLASSESSMENT:24261}. Cravings are {EWCONTROLASSESSMENT:24261}.  Current exercise: {exercise types:16438}  Interim History: ***  Assessment/Plan:  1. ***  2. ***  3. ***  Obesity: Current BMI *** Katie Curtis {CHL AMB IS/IS NOT:210130109} currently in the action stage of change. As such, her goal is to {MWMwtloss#1:210800005}.  She has agreed to {MWMwtlossportion/plan2:23431}.   Exercise goals: {MWM EXERCISE RECS:23473}  Behavioral modification strategies: {MWMwtlossdietstrategies3:23432}.  Katie Curtis has agreed to follow-up with our clinic in {NUMBER 1-10:22536} weeks.   No orders of the defined types were placed in this encounter.   There are no discontinued medications.   No orders of the defined types were placed in this encounter.     Objective:   VITALS: Per patient if applicable, see vitals. GENERAL: Alert and in no acute distress. CARDIOPULMONARY: No increased WOB. Speaking in clear sentences.  PSYCH: Pleasant and cooperative. Speech normal rate and rhythm. Affect is appropriate. Insight and judgement are appropriate. Attention is focused, linear, and appropriate.  NEURO: Oriented as arrived to  appointment on time with no prompting.   Lab Results  Component Value Date   CREATININE 0.65 04/24/2021   BUN 12 04/24/2021   NA 141 04/24/2021   K 3.9 04/24/2021   CL 102 04/24/2021   CO2 16 (L) 04/24/2021   Lab Results  Component Value Date   ALT 22 04/24/2021   AST 18 04/24/2021   ALKPHOS 92 04/24/2021   BILITOT 0.3 04/24/2021   Lab Results  Component Value Date   HGBA1C 5.9 (H) 04/24/2021   HGBA1C 6.1 (H) 04/17/2020   HGBA1C 5.7 (H) 03/21/2019   HGBA1C 5.7 (H) 06/29/2018   Lab Results  Component Value Date   INSULIN 32.8 (H) 04/24/2021   INSULIN 13.1 06/29/2018   Lab Results  Component Value Date   TSH 1.520 04/24/2021   Lab Results  Component Value Date   CHOL 194 04/24/2021   HDL 54 04/24/2021   LDLCALC 126 (H) 04/24/2021   TRIG 76 04/24/2021   CHOLHDL 3.6 04/24/2021   Lab Results  Component Value Date   WBC 10.0 04/24/2021   HGB 12.2 04/24/2021   HCT 37.4 04/24/2021   MCV 86 04/24/2021   PLT 381 04/24/2021   Lab Results  Component Value Date   FERRITIN 23 03/19/2018   Lab Results  Component Value Date   VD25OH 10.7 (L) 04/24/2021   VD25OH 14 (L) 04/17/2020   VD25OH 22.3 (L) 06/29/2018    Attestation Statements:   Reviewed by clinician on day of visit: allergies, medications, problem list, medical history, surgical history, family history, social history, and previous encounter notes.  ***(delete if time-based billing not used) Time spent on visit including the items listed below was *** minutes.  -preparing to see the patient (e.g., review  of tests, history, previous notes) -obtaining and/or reviewing separately obtained history -counseling and educating the patient/family/caregiver -documenting clinical information in the electronic or other health record -ordering medications, tests, or procedures -independently interpreting results and communicating results to the patient/ family/caregiver -referring and communicating with other  health care professionals  -care coordination

## 2021-06-24 ENCOUNTER — Encounter (INDEPENDENT_AMBULATORY_CARE_PROVIDER_SITE_OTHER): Payer: Self-pay | Admitting: Family Medicine

## 2021-06-24 ENCOUNTER — Telehealth (INDEPENDENT_AMBULATORY_CARE_PROVIDER_SITE_OTHER): Payer: BC Managed Care – PPO | Admitting: Family Medicine

## 2021-06-24 DIAGNOSIS — R7303 Prediabetes: Secondary | ICD-10-CM | POA: Diagnosis not present

## 2021-06-24 DIAGNOSIS — Z6835 Body mass index (BMI) 35.0-35.9, adult: Secondary | ICD-10-CM | POA: Diagnosis not present

## 2021-06-24 DIAGNOSIS — E559 Vitamin D deficiency, unspecified: Secondary | ICD-10-CM | POA: Diagnosis not present

## 2021-06-24 DIAGNOSIS — E669 Obesity, unspecified: Secondary | ICD-10-CM

## 2021-06-24 DIAGNOSIS — Z7985 Long-term (current) use of injectable non-insulin antidiabetic drugs: Secondary | ICD-10-CM

## 2021-06-24 DIAGNOSIS — E66811 Obesity, class 1: Secondary | ICD-10-CM

## 2021-06-24 MED ORDER — TRULICITY 1.5 MG/0.5ML ~~LOC~~ SOAJ
1.5000 mg | SUBCUTANEOUS | 0 refills | Status: DC
Start: 2021-06-24 — End: 2021-07-11

## 2021-06-24 MED ORDER — VITAMIN D (ERGOCALCIFEROL) 1.25 MG (50000 UNIT) PO CAPS
50000.0000 [IU] | ORAL_CAPSULE | ORAL | 0 refills | Status: DC
Start: 2021-06-24 — End: 2021-07-11

## 2021-07-05 ENCOUNTER — Other Ambulatory Visit (INDEPENDENT_AMBULATORY_CARE_PROVIDER_SITE_OTHER): Payer: Self-pay | Admitting: Family Medicine

## 2021-07-05 DIAGNOSIS — E559 Vitamin D deficiency, unspecified: Secondary | ICD-10-CM

## 2021-07-11 ENCOUNTER — Ambulatory Visit (INDEPENDENT_AMBULATORY_CARE_PROVIDER_SITE_OTHER): Payer: BC Managed Care – PPO | Admitting: Family Medicine

## 2021-07-11 ENCOUNTER — Encounter (INDEPENDENT_AMBULATORY_CARE_PROVIDER_SITE_OTHER): Payer: Self-pay | Admitting: Family Medicine

## 2021-07-11 VITALS — BP 136/88 | HR 57 | Temp 98.3°F | Ht 64.0 in | Wt 214.0 lb

## 2021-07-11 DIAGNOSIS — Z7985 Long-term (current) use of injectable non-insulin antidiabetic drugs: Secondary | ICD-10-CM

## 2021-07-11 DIAGNOSIS — E669 Obesity, unspecified: Secondary | ICD-10-CM | POA: Diagnosis not present

## 2021-07-11 DIAGNOSIS — R7303 Prediabetes: Secondary | ICD-10-CM | POA: Diagnosis not present

## 2021-07-11 DIAGNOSIS — Z6836 Body mass index (BMI) 36.0-36.9, adult: Secondary | ICD-10-CM

## 2021-07-11 DIAGNOSIS — E559 Vitamin D deficiency, unspecified: Secondary | ICD-10-CM

## 2021-07-11 MED ORDER — TRULICITY 1.5 MG/0.5ML ~~LOC~~ SOAJ: 1.5000 mg | SUBCUTANEOUS | 0 refills | Status: DC

## 2021-07-11 MED ORDER — VITAMIN D (ERGOCALCIFEROL) 1.25 MG (50000 UNIT) PO CAPS: 50000.0000 [IU] | ORAL_CAPSULE | ORAL | 0 refills | Status: DC

## 2021-07-15 ENCOUNTER — Ambulatory Visit (INDEPENDENT_AMBULATORY_CARE_PROVIDER_SITE_OTHER): Payer: BC Managed Care – PPO | Admitting: Obstetrics and Gynecology

## 2021-07-15 ENCOUNTER — Other Ambulatory Visit (HOSPITAL_COMMUNITY)
Admission: RE | Admit: 2021-07-15 | Discharge: 2021-07-15 | Disposition: A | Discharge status: Home / Self Care | Payer: BC Managed Care – PPO | Source: Ambulatory Visit | Attending: Obstetrics and Gynecology | Admitting: Obstetrics and Gynecology

## 2021-07-15 ENCOUNTER — Encounter: Payer: Self-pay | Admitting: Obstetrics and Gynecology

## 2021-07-15 VITALS — BP 140/84 | HR 92 | Ht 63.5 in | Wt 214.0 lb

## 2021-07-15 DIAGNOSIS — Z124 Encounter for screening for malignant neoplasm of cervix: Secondary | ICD-10-CM | POA: Insufficient documentation

## 2021-07-15 DIAGNOSIS — Z113 Encounter for screening for infections with a predominantly sexual mode of transmission: Secondary | ICD-10-CM | POA: Insufficient documentation

## 2021-07-15 DIAGNOSIS — Z1159 Encounter for screening for other viral diseases: Secondary | ICD-10-CM

## 2021-07-15 DIAGNOSIS — Z87410 Personal history of cervical dysplasia: Secondary | ICD-10-CM

## 2021-07-15 DIAGNOSIS — Z114 Encounter for screening for human immunodeficiency virus [HIV]: Secondary | ICD-10-CM

## 2021-07-15 DIAGNOSIS — Z01419 Encounter for gynecological examination (general) (routine) without abnormal findings: Secondary | ICD-10-CM | POA: Diagnosis not present

## 2021-07-15 NOTE — Patient Instructions (Signed)

## 2021-07-15 NOTE — Progress Notes (Signed)
41 y.o. G1P1 Single [place] American female here for annual exam.    Patient wants STD testing.  Wants a pap today.  She understands that insurance may not pay.   PCP:   None  Patient's last menstrual period was 06/24/2021 (exact date).     Period Cycle (Days): 30 Period Duration (Days): 4-5 Period Pattern: Regular Menstrual Flow: Moderate Menstrual Control: Maxi pad Menstrual Control Change Freq (Hours): changes maxi pad 3x/day on heaviest day Dysmenorrhea: (!) Mild Dysmenorrhea Symptoms: Cramping     Sexually active: No.  The current method of family planning is abstinence.    Exercising: No.  The patient does not participate in regular exercise at present. 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 01-09-15 colpo revealed Neg ECC;12-28-14 ASCUS:Pos HR HPV. LEEP 2011. CIN 1 MMG: 09-26-19 Rt.Br.neg;Lt.Br.axilla poss.adenopathy.  Diag.Lt.Br.w/US/Neg/Birads1/screening 56yr Colonoscopy:  n/a BMD:   n/a  Result  n/a TDaP:  08-29-12 Gardasil:   no HIV:Neg in the past Hep C:Neg in the past Screening Labs:  PCP.   reports that she has never smoked. She has never used smokeless tobacco. She reports current alcohol use. She reports that she does not use drugs.  Past Medical History:  Diagnosis Date   Abnormal Pap smear    Anemia    Anemia    Gallbladder problem    Pilonidal cyst    Prediabetes    SOBOE (shortness of breath on exertion)    Urinary incontinence    Vitamin D deficiency     Past Surgical History:  Procedure Laterality Date   EYE SURGERY  1996   LEEP     CIN II    Current Outpatient Medications  Medication Sig Dispense Refill   Dulaglutide (TRULICITY) 1.5 MG/0.5ML SOPN Inject 1.5 mg into the skin once a week. 2 mL 0   Vitamin D, Ergocalciferol, (DRISDOL) 1.25 MG (50000 UNIT) CAPS capsule Take 1  capsule (50,000 Units total) by mouth every 7 (seven) days. 4 capsule 0   No current facility-administered medications for this visit.    Family History  Problem Relation Age of Onset   Depression Mother    Sleep apnea Mother    Alcoholism Mother    Hypertension Father    Congestive Heart Failure Father        had been on dialysis x 10 years   Diabetes Father    High Cholesterol Father    Heart disease Father    Kidney disease Father    Cancer Father    Obesity Father    Stroke Maternal Grandmother    Lung cancer Paternal Grandmother        smoker   Hypertension Paternal Grandmother    Hypothyroidism Paternal Grandmother    Hypertension Paternal Grandfather    Kidney failure Paternal Grandfather     Review of Systems  All other systems reviewed and are negative.   Exam:   BP 140/84   Pulse 92   Ht 5' 3.5" (1.613 m)   Wt 214 lb (97.1 kg)   LMP 06/24/2021 (Exact Date)   SpO2 98%   BMI 37.31 kg/m     General appearance: alert, cooperative and appears stated age Head: normocephalic, without obvious abnormality, atraumatic Neck: no adenopathy, supple, symmetrical, trachea midline and thyroid normal to inspection and palpation Lungs: clear to auscultation bilaterally  Breasts: normal appearance, no masses or tenderness, No nipple retraction or dimpling, No nipple discharge or bleeding, No axillary adenopathy Heart: regular rate and rhythm Abdomen: soft, non-tender; no masses, no organomegaly.  Exam limited by involuntary guiarding.  Extremities: extremities normal, atraumatic, no cyanosis or edema Skin: skin color, texture, turgor normal. No rashes or lesions Lymph nodes: cervical, supraclavicular, and axillary nodes normal. Neurologic: grossly normal  Pelvic: External genitalia:  no lesions              No abnormal inguinal nodes palpated.              Urethra:  normal appearing urethra with no masses, tenderness or lesions              Bartholins and Skenes: normal                  Vagina: normal appearing vagina with normal color and discharge, no lesions              Cervix: no lesions              Pap taken: yes Bimanual Exam:  Uterus:  normal size, contour, position, consistency, mobility, non-tender.  Bimanual exam limited by involuntary guarding.               Adnexa: no mass, fullness, tenderness              Rectal exam: yes.  Confirms.              Anus:  normal sphincter tone, no lesions  Chaperone was present for exam:  Marchelle Folks, CMA.  Assessment:   Well woman visit with gynecologic exam. Hx recurrent cervical dysplasia. Hx CIN II, status post LEEP.  Elevated A1C.  On Trulicity.   Plan: Mammogram screening discussed.  She will schedule at [facility]. Self breast awareness reviewed. Pap and HR HPV as above. Guidelines for Calcium, Vitamin D, regular exercise program including cardiovascular and weight bearing exercise. STD screening for HIV, syphilis, hpe C GC/CT, and trichomonas.  Decline Gardasil vaccine.  Follow up annually and prn.   After visit summary provided.

## 2021-07-15 NOTE — Progress Notes (Signed)
Chief Complaint:   OBESITY Katie Curtis is here to discuss her progress with her obesity treatment plan along with follow-up of her obesity related diagnoses. Katie Curtis is on the Category 2 Plan + 100 calories and states she is following her eating plan approximately 50% of the time. Katie Curtis states she is doing 0 minutes 0 times per week.  Today's visit was #: 7 Starting weight: 218 lbs Starting date: 04/24/2021 Today's weight: 214 lbs Today's date: 07/11/2021 Total lbs lost to date: 4 Total lbs lost since last in-office visit: 0  Interim History: Katie Curtis has been doing more traveling and eating out.  She is up some weight but approximately half of this appears to be water weight.  She will continue to do more traveling this summer and she would like to discuss travel eating strategies.  Subjective:   1. Pre-diabetes Katie Curtis is stable on Trulicity.  She notes some increased nausea if she goes too long in between meals.  2. Vitamin D deficiency Katie Curtis is stable on vitamin D, with no side effects noted.  Assessment/Plan:   1. Pre-diabetes Katie Curtis will continue Trulicity 1.5 mg once weekly, and we will refill for 1 month.  She is to plan for a protein snack to help avoid nausea.  - Dulaglutide (TRULICITY) 1.5 MG/0.5ML SOPN; Inject 1.5 mg into the skin once a week.  Dispense: 2 mL; Refill: 0  2. Vitamin D deficiency We will refill prescription Vitamin D 50,000 IU every week for 1 month. Katie Curtis will follow-up for routine testing of Vitamin D, at least 2-3 times per year to avoid over-replacement.  - Vitamin D, Ergocalciferol, (DRISDOL) 1.25 MG (50000 UNIT) CAPS capsule; Take 1 capsule (50,000 Units total) by mouth every 7 (seven) days.  Dispense: 4 capsule; Refill: 0  3. Obesity, Current BMI 36.8 Katie Curtis is currently in the action stage of change. As such, her goal is to continue with weight loss efforts. She has agreed to the Category 2 Plan.   Behavioral modification strategies: travel  eating strategies.  Katie Curtis has agreed to follow-up with our clinic in 2 to 3 weeks. She was informed of the importance of frequent follow-up visits to maximize her success with intensive lifestyle modifications for her multiple health conditions.   Objective:   Blood pressure 136/88, pulse (!) 57, temperature 98.3 F (36.8 C), height 5\' 4"  (1.626 m), weight 214 lb (97.1 kg), SpO2 100 %. Body mass index is 36.73 kg/m.  General: Cooperative, alert, well developed, in no acute distress. HEENT: Conjunctivae and lids unremarkable. Cardiovascular: Regular rhythm.  Lungs: Normal work of breathing. Neurologic: No focal deficits.   Lab Results  Component Value Date   CREATININE 0.65 04/24/2021   BUN 12 04/24/2021   NA 141 04/24/2021   K 3.9 04/24/2021   CL 102 04/24/2021   CO2 16 (L) 04/24/2021   Lab Results  Component Value Date   ALT 22 04/24/2021   AST 18 04/24/2021   ALKPHOS 92 04/24/2021   BILITOT 0.3 04/24/2021   Lab Results  Component Value Date   HGBA1C 5.9 (H) 04/24/2021   HGBA1C 6.1 (H) 04/17/2020   HGBA1C 5.7 (H) 03/21/2019   HGBA1C 5.7 (H) 06/29/2018   Lab Results  Component Value Date   INSULIN 32.8 (H) 04/24/2021   INSULIN 13.1 06/29/2018   Lab Results  Component Value Date   TSH 1.520 04/24/2021   Lab Results  Component Value Date   CHOL 194 04/24/2021   HDL 54 04/24/2021  LDLCALC 126 (H) 04/24/2021   TRIG 76 04/24/2021   CHOLHDL 3.6 04/24/2021   Lab Results  Component Value Date   VD25OH 10.7 (L) 04/24/2021   VD25OH 14 (L) 04/17/2020   VD25OH 22.3 (L) 06/29/2018   Lab Results  Component Value Date   WBC 10.0 04/24/2021   HGB 12.2 04/24/2021   HCT 37.4 04/24/2021   MCV 86 04/24/2021   PLT 381 04/24/2021   Lab Results  Component Value Date   FERRITIN 23 03/19/2018   Attestation Statements:   Reviewed by clinician on day of visit: allergies, medications, problem list, medical history, surgical history, family history, social history,  and previous encounter notes.   I, Burt Knack, am acting as transcriptionist for Quillian Quince, MD.  I have reviewed the above documentation for accuracy and completeness, and I agree with the above. -  Quillian Quince, MD

## 2021-07-16 LAB — HIV ANTIBODY (ROUTINE TESTING W REFLEX): HIV 1&2 Ab, 4th Generation: NONREACTIVE

## 2021-07-16 LAB — HEPATITIS C ANTIBODY: Hepatitis C Ab: NONREACTIVE

## 2021-07-16 LAB — RPR: RPR Ser Ql: NONREACTIVE

## 2021-07-18 LAB — CYTOLOGY - PAP
Chlamydia: NEGATIVE
Comment: NEGATIVE
Comment: NEGATIVE
Comment: NEGATIVE
Comment: NORMAL
Diagnosis: NEGATIVE
Diagnosis: REACTIVE
High risk HPV: NEGATIVE
Neisseria Gonorrhea: NEGATIVE
Trichomonas: NEGATIVE

## 2021-07-24 NOTE — Progress Notes (Signed)
TeleHealth Visit:  This visit was completed with telemedicine (audio/video) technology. Katie Curtis has verbally consented to this TeleHealth visit. The patient is located at home, the provider is located at home. The participants in this visit include the listed provider and patient. The visit was conducted today via MyChart video.  OBESITY Katie Curtis is here to discuss her progress with her obesity treatment plan along with follow-up of her obesity related diagnoses.   Today's visit was # 8 Starting weight: 218 lbs Starting date: 04/24/2021 Weight at last in office visit: 214 lbs on 07/11/21 Total weight loss: 4 lbs at last in office visit on 07/11/21. Today's reported weight: No weight reported.  Nutrition Plan: the Category 2 Plan.  Hunger is moderately controlled. Cravings are moderately controlled.  Current exercise: none  Interim History: Katie Curtis has traveled quite a bit this summer and still has a trip to [place] coming up.  She reports being somewhat off plan.  She does well at breakfast and lunch but then deviates from plan at dinner sometimes.   She is currently staying with her mother and her air conditioning is out which has curtailed cooking to some degree. She says she has plans to exercise every week but has not put this into action yet.  She has a Research scientist (physical sciences) at [employer]. Endorses sweets cravings.   Assessment/Plan:  1. Prediabetes Last A1c was elevated at 5.9. Medication(s): Trulicity 1.5 mg weekly.  Notes some hunger and sweets cravings.  Denies side effects. Lab Results  Component Value Date   HGBA1C 5.9 (H) 04/24/2021   Lab Results  Component Value Date   INSULIN 32.8 (H) 04/24/2021   INSULIN 13.1 06/29/2018    Plan: Increase dose of Trulicity to 3 mg. Refill Trulicity 3 mg weekly.   2. Vitamin D Deficiency Vitamin D is not at goal of 50-last vitamin D was 10.7 on 04/24/2021.Marland Kitchen  She is on weekly prescription Vitamin D 50,000 IU.  Lab Results  Component  Value Date   VD25OH 10.7 (L) 04/24/2021   VD25OH 14 (L) 04/17/2020   VD25OH 22.3 (L) 06/29/2018    Plan: Refill prescription vitamin D 50,000 IU weekly.  3. Obesity: Current BMI 36.7 Katie Curtis is currently in the action stage of change. As such, her goal is to continue with weight loss efforts.  She has agreed to the Category 2 Plan and keeping a food journal and adhering to recommended goals of 400-500 calories and 35 gms protein.   Discussed how making a weekly grocery trip and planning meals could help with adherence to plan at dinner. Information for journaling dinner and recipe handout sent via MyChart. Exercise goals: She will go to the gym once weekly over the next few weeks.  Behavioral modification strategies: increasing lean protein intake, decreasing simple carbohydrates, meal planning and cooking strategies, and keeping healthy foods in the home.  Katie Curtis has agreed to follow-up with our clinic in 3 weeks.   No orders of the defined types were placed in this encounter.   Medications Discontinued During This Encounter  Medication Reason   Dulaglutide (TRULICITY) 1.5 MG/0.5ML SOPN Dose change   Vitamin D, Ergocalciferol, (DRISDOL) 1.25 MG (50000 UNIT) CAPS capsule Reorder     Meds ordered this encounter  Medications   Dulaglutide (TRULICITY) 3 MG/0.5ML SOPN    Sig: Inject 3 mg as directed once a week.    Dispense:  2 mL    Refill:  0    Order Specific Question:   Supervising Provider  Answer:   Quillian Quince D [[id number]]   Vitamin D, Ergocalciferol, (DRISDOL) 1.25 MG (50000 UNIT) CAPS capsule    Sig: Take 1 capsule (50,000 Units total) by mouth every 7 (seven) days.    Dispense:  4 capsule    Refill:  0    Order Specific Question:   Supervising Provider    Answer:   Quillian Quince D [[id number]]      Objective:   VITALS: Per patient if applicable, see vitals. GENERAL: Alert and in no acute distress. CARDIOPULMONARY: No increased WOB. Speaking in clear sentences.   PSYCH: Pleasant and cooperative. Speech normal rate and rhythm. Affect is appropriate. Insight and judgement are appropriate. Attention is focused, linear, and appropriate.  NEURO: Oriented as arrived to appointment on time with no prompting.   Lab Results  Component Value Date   CREATININE 0.65 04/24/2021   BUN 12 04/24/2021   NA 141 04/24/2021   K 3.9 04/24/2021   CL 102 04/24/2021   CO2 16 (L) 04/24/2021   Lab Results  Component Value Date   ALT 22 04/24/2021   AST 18 04/24/2021   ALKPHOS 92 04/24/2021   BILITOT 0.3 04/24/2021   Lab Results  Component Value Date   HGBA1C 5.9 (H) 04/24/2021   HGBA1C 6.1 (H) 04/17/2020   HGBA1C 5.7 (H) 03/21/2019   HGBA1C 5.7 (H) 06/29/2018   Lab Results  Component Value Date   INSULIN 32.8 (H) 04/24/2021   INSULIN 13.1 06/29/2018   Lab Results  Component Value Date   TSH 1.520 04/24/2021   Lab Results  Component Value Date   CHOL 194 04/24/2021   HDL 54 04/24/2021   LDLCALC 126 (H) 04/24/2021   TRIG 76 04/24/2021   CHOLHDL 3.6 04/24/2021   Lab Results  Component Value Date   WBC 10.0 04/24/2021   HGB 12.2 04/24/2021   HCT 37.4 04/24/2021   MCV 86 04/24/2021   PLT 381 04/24/2021   Lab Results  Component Value Date   FERRITIN 23 03/19/2018   Lab Results  Component Value Date   VD25OH 10.7 (L) 04/24/2021   VD25OH 14 (L) 04/17/2020   VD25OH 22.3 (L) 06/29/2018    Attestation Statements:   Reviewed by clinician on day of visit: allergies, medications, problem list, medical history, surgical history, family history, social history, and previous encounter notes.

## 2021-07-25 ENCOUNTER — Telehealth (INDEPENDENT_AMBULATORY_CARE_PROVIDER_SITE_OTHER): Payer: BC Managed Care – PPO | Admitting: Family Medicine

## 2021-07-25 ENCOUNTER — Encounter (INDEPENDENT_AMBULATORY_CARE_PROVIDER_SITE_OTHER): Payer: Self-pay | Admitting: Family Medicine

## 2021-07-25 DIAGNOSIS — R7303 Prediabetes: Secondary | ICD-10-CM

## 2021-07-25 DIAGNOSIS — E669 Obesity, unspecified: Secondary | ICD-10-CM | POA: Diagnosis not present

## 2021-07-25 DIAGNOSIS — Z6836 Body mass index (BMI) 36.0-36.9, adult: Secondary | ICD-10-CM | POA: Diagnosis not present

## 2021-07-25 DIAGNOSIS — E559 Vitamin D deficiency, unspecified: Secondary | ICD-10-CM

## 2021-07-25 MED ORDER — VITAMIN D (ERGOCALCIFEROL) 1.25 MG (50000 UNIT) PO CAPS: 50000.0000 [IU] | ORAL_CAPSULE | ORAL | 0 refills | Status: DC

## 2021-07-25 MED ORDER — TRULICITY 3 MG/0.5ML ~~LOC~~ SOAJ: 3.0000 mg | SUBCUTANEOUS | 0 refills | Status: DC

## 2021-08-14 ENCOUNTER — Encounter (INDEPENDENT_AMBULATORY_CARE_PROVIDER_SITE_OTHER): Payer: Self-pay

## 2021-08-15 ENCOUNTER — Encounter (INDEPENDENT_AMBULATORY_CARE_PROVIDER_SITE_OTHER): Payer: Self-pay | Admitting: Family Medicine

## 2021-08-15 ENCOUNTER — Ambulatory Visit (INDEPENDENT_AMBULATORY_CARE_PROVIDER_SITE_OTHER): Payer: BC Managed Care – PPO | Admitting: Family Medicine

## 2021-08-15 VITALS — BP 135/89 | HR 93 | Temp 97.9°F | Ht 64.0 in | Wt 214.0 lb

## 2021-08-15 DIAGNOSIS — E669 Obesity, unspecified: Secondary | ICD-10-CM

## 2021-08-15 DIAGNOSIS — Z6833 Body mass index (BMI) 33.0-33.9, adult: Secondary | ICD-10-CM

## 2021-08-15 DIAGNOSIS — Z7985 Long-term (current) use of injectable non-insulin antidiabetic drugs: Secondary | ICD-10-CM

## 2021-08-15 DIAGNOSIS — R7303 Prediabetes: Secondary | ICD-10-CM

## 2021-08-15 DIAGNOSIS — E559 Vitamin D deficiency, unspecified: Secondary | ICD-10-CM

## 2021-08-15 DIAGNOSIS — Z6838 Body mass index (BMI) 38.0-38.9, adult: Secondary | ICD-10-CM

## 2021-08-15 MED ORDER — VITAMIN D (ERGOCALCIFEROL) 1.25 MG (50000 UNIT) PO CAPS: 50000.0000 [IU] | ORAL_CAPSULE | ORAL | 0 refills | Status: DC

## 2021-08-15 MED ORDER — TRULICITY 3 MG/0.5ML ~~LOC~~ SOAJ: 3.0000 mg | SUBCUTANEOUS | 0 refills | Status: DC

## 2021-08-23 ENCOUNTER — Other Ambulatory Visit (INDEPENDENT_AMBULATORY_CARE_PROVIDER_SITE_OTHER): Payer: Self-pay | Admitting: Family Medicine

## 2021-08-23 DIAGNOSIS — R7303 Prediabetes: Secondary | ICD-10-CM

## 2021-08-26 ENCOUNTER — Telehealth (INDEPENDENT_AMBULATORY_CARE_PROVIDER_SITE_OTHER): Payer: Self-pay | Admitting: Family Medicine

## 2021-08-26 NOTE — Progress Notes (Unsigned)
Chief Complaint:   OBESITY Katie Curtis is here to discuss her progress with her obesity treatment plan along with follow-up of her obesity related diagnoses. Katie Curtis is on the Category 2 Plan and keeping a food journal and adhering to recommended goals of 400-500 calories and 35 grams of protein and states she is following her eating plan approximately 50% of the time. Katie Curtis states she is doing 0 minutes 0 times per week.  Today's visit was #: 9 Starting weight: 218 lbs Starting date: 04/24/2021 Today's weight: 214 lbs Today's date: 08/15/2021 Total lbs lost to date: 4 Total lbs lost since last in-office visit: 0  Interim History: Katie Curtis has done well with maintaining her weight loss even with being on vacation and with some celebration eating.  She is working on getting back on track with her eating plan and exercise.  Subjective:   1. Vitamin D deficiency Katie Curtis is stable on vitamin D, and she is due to have labs done soon.  2. Prediabetes Katie Curtis is working on her diet, and she has no problems with Trulicity.  Assessment/Plan:   1. Vitamin D deficiency We will refill prescription Vitamin D for 1 month, and we will recheck labs in 1-2 months.   - Vitamin D, Ergocalciferol, (DRISDOL) 1.25 MG (50000 UNIT) CAPS capsule; Take 1 capsule (50,000 Units total) by mouth every 7 (seven) days.  Dispense: 4 capsule; Refill: 0  2. Prediabetes We will refill Trulicity for 1 month, and we will recheck labs in 1-2 months.   - Dulaglutide (TRULICITY) 3 MG/0.5ML SOPN; Inject 3 mg as directed once a week.  Dispense: 2 mL; Refill: 0  3. Obesity, Current BMI 38.0 Katie Curtis is currently in the action stage of change. As such, her goal is to continue with weight loss efforts. She has agreed to the Category 2 Plan and keeping a food journal and adhering to recommended goals of 400-500 calories and 35 grams of protein.   Exercise goals: For substantial health benefits, adults should do at least 150  minutes (2 hours and 30 minutes) a week of moderate-intensity, or 75 minutes (1 hour and 15 minutes) a week of vigorous-intensity aerobic physical activity, or an equivalent combination of moderate- and vigorous-intensity aerobic activity. Aerobic activity should be performed in episodes of at least 10 minutes, and preferably, it should be spread throughout the week.  Behavioral modification strategies: increasing lean protein intake.  Katie Curtis has agreed to follow-up with our clinic in 2 weeks via virtual with [facility], FNP-C, and then in 2 to 3 weeks with myself (fasting for labs). She was informed of the importance of frequent follow-up visits to maximize her success with intensive lifestyle modifications for her multiple health conditions.   Objective:   Blood pressure 135/89, pulse 93, temperature 97.9 F (36.6 C), height 5\' 4"  (1.626 m), weight 214 lb (97.1 kg), SpO2 99 %. Body mass index is 36.73 kg/m.  General: Cooperative, alert, well developed, in no acute distress. HEENT: Conjunctivae and lids unremarkable. Cardiovascular: Regular rhythm.  Lungs: Normal work of breathing. Neurologic: No focal deficits.   Lab Results  Component Value Date   CREATININE 0.65 04/24/2021   BUN 12 04/24/2021   NA 141 04/24/2021   K 3.9 04/24/2021   CL 102 04/24/2021   CO2 16 (L) 04/24/2021   Lab Results  Component Value Date   ALT 22 04/24/2021   AST 18 04/24/2021   ALKPHOS 92 04/24/2021   BILITOT 0.3 04/24/2021   Lab Results  Component Value Date   HGBA1C 5.9 (H) 04/24/2021   HGBA1C 6.1 (H) 04/17/2020   HGBA1C 5.7 (H) 03/21/2019   HGBA1C 5.7 (H) 06/29/2018   Lab Results  Component Value Date   INSULIN 32.8 (H) 04/24/2021   INSULIN 13.1 06/29/2018   Lab Results  Component Value Date   TSH 1.520 04/24/2021   Lab Results  Component Value Date   CHOL 194 04/24/2021   HDL 54 04/24/2021   LDLCALC 126 (H) 04/24/2021   TRIG 76 04/24/2021   CHOLHDL 3.6 04/24/2021   Lab  Results  Component Value Date   VD25OH 10.7 (L) 04/24/2021   VD25OH 14 (L) 04/17/2020   VD25OH 22.3 (L) 06/29/2018   Lab Results  Component Value Date   WBC 10.0 04/24/2021   HGB 12.2 04/24/2021   HCT 37.4 04/24/2021   MCV 86 04/24/2021   PLT 381 04/24/2021   Lab Results  Component Value Date   FERRITIN 23 03/19/2018   Attestation Statements:   Reviewed by clinician on day of visit: allergies, medications, problem list, medical history, surgical history, family history, social history, and previous encounter notes.   I, Burt Knack, am acting as transcriptionist for Quillian Quince, MD.  I have reviewed the above documentation for accuracy and completeness, and I agree with the above. -  Quillian Quince, MD

## 2021-08-26 NOTE — Telephone Encounter (Signed)
TRULICITY Prior Auth form was faxed Friday, 8/21 per patient. Pt request phone call to update status of receipt of form. Pt states she is to begin using medication today.

## 2021-08-26 NOTE — Telephone Encounter (Signed)
Patient already sent a mychart message in reference to status of prior authorization. Thanks!

## 2021-08-27 ENCOUNTER — Telehealth (INDEPENDENT_AMBULATORY_CARE_PROVIDER_SITE_OTHER): Payer: Self-pay | Admitting: Family Medicine

## 2021-08-27 ENCOUNTER — Encounter (INDEPENDENT_AMBULATORY_CARE_PROVIDER_SITE_OTHER): Payer: Self-pay

## 2021-08-27 NOTE — Telephone Encounter (Signed)
Dr. Dalbert Garnet - Prior authorization denied for Trulicity. Per insurance: Patient does not have type 2 diabetes. Patient sent denial message via mychart.

## 2021-09-04 NOTE — Progress Notes (Signed)
TeleHealth Visit:  This visit was completed with telemedicine (audio/video) technology. Katie Curtis has verbally consented to this TeleHealth visit. The patient is located at home, the provider is located at home. The participants in this visit include the listed provider and patient. The visit was conducted today via MyChart video.  OBESITY Katie Curtis is here to discuss her progress with her obesity treatment plan along with follow-up of her obesity related diagnoses.   Today's visit was # 10 Starting weight: 218 lbs Starting date: 04/24/2021 Weight at last in office visit: 214 lbs on 08/15/21 Total weight loss: 4 lbs at last in office visit on 08/15/21. Today's reported weight:  No weight reported.  Nutrition Plan: the Category 2 Plan and keeping a food journal and adhering to recommended goals of 400-500 calories and 35 gms protein at dinner- 60-70% adherence Hunger is poorly controlled.  Current exercise: Got gym membership on Aug. 11. Working out a few days per week-30-40 minutes on treadmill. (None this week).  Interim History: Katie Curtis feels she is sticking to the plan well at breakfast and lunch but struggles at dinner.  She does not feel like cooking after work.  Sometimes stops at [employer] or [employer] for dinner. She packs her lunch for work. 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 Trulicity coverage denied by insurance.  Assessment/Plan:  1. Prediabetes Last A1c elevated at 5.9. Medication(s): None.  Trulicity was not covered by insurance. Reports polyphagia. Lab Results  Component Value Date   HGBA1C 5.9 (H) 04/24/2021   Lab Results  Component Value Date   INSULIN 32.8 (H) 04/24/2021   INSULIN 13.1 06/29/2018    Plan: Continue to work on reducing simple carbohydrates. We will try to get Saxenda covered.   2. Vitamin D Deficiency Vitamin D is very low at  10.7. She is on weekly prescription Vitamin D 50,000 IU.  Lab Results  Component Value Date   VD25OH 10.7 (L) 04/24/2021   VD25OH 14 (L) 04/17/2020   VD25OH 22.3 (L) 06/29/2018    Plan: Continue prescription vitamin D 50,000 IU weekly.   3.  Obesity: Current BMI 36.7 New prescription for Saxenda 3 mg daily, dispense 15 mL, no refills. Katie Curtis is to take 0.6 mg for 1 week.  Increase to 1.2 mg after 1 week if no nausea.  Katie Curtis is currently in the action stage of change. As such, her goal is to continue with weight loss efforts.  She has agreed to the Category 2 Plan and keeping a food journal and adhering to recommended goals of 400-500 calories and 35 protein.   Message sent via MyChart: Dining out handout, directions for Saxenda dosage.  Exercise goals: She will get back to going to the gym and incorporate resistance training as well as walking on the treadmill.  Behavioral modification strategies: increasing lean protein intake, decreasing simple carbohydrates, and meal planning and cooking strategies.  Katie Curtis has agreed to follow-up with our clinic in 2 weeks.   No orders of the defined types were placed in this encounter.   Medications Discontinued During This Encounter  Medication Reason   Dulaglutide (TRULICITY) 3 MG/0.5ML SOPN Cost of medication     Meds ordered this encounter  Medications   Liraglutide -Weight Management (SAXENDA) 18 MG/3ML SOPN    Sig: Inject 3 mg into the skin daily.    Dispense:  15 mL    Refill:  0  Order Specific Question:   Supervising Provider    Answer:   Wilder Glade [[id number]]   Insulin Pen Needle (BD PEN NEEDLE NANO 2ND GEN) 32G X 4 MM MISC    Sig: Use 1 needle daily to inject medication.    Dispense:  100 each    Refill:  0    Order Specific Question:   Supervising Provider    Answer:   Quillian Quince D [[id number]]      Objective:   VITALS: Per patient if applicable, see vitals. GENERAL: Alert and in no acute  distress. CARDIOPULMONARY: No increased WOB. Speaking in clear sentences.  PSYCH: Pleasant and cooperative. Speech normal rate and rhythm. Affect is appropriate. Insight and judgement are appropriate. Attention is focused, linear, and appropriate.  NEURO: Oriented as arrived to appointment on time with no prompting.   Lab Results  Component Value Date   CREATININE 0.65 04/24/2021   BUN 12 04/24/2021   NA 141 04/24/2021   K 3.9 04/24/2021   CL 102 04/24/2021   CO2 16 (L) 04/24/2021   Lab Results  Component Value Date   ALT 22 04/24/2021   AST 18 04/24/2021   ALKPHOS 92 04/24/2021   BILITOT 0.3 04/24/2021   Lab Results  Component Value Date   HGBA1C 5.9 (H) 04/24/2021   HGBA1C 6.1 (H) 04/17/2020   HGBA1C 5.7 (H) 03/21/2019   HGBA1C 5.7 (H) 06/29/2018   Lab Results  Component Value Date   INSULIN 32.8 (H) 04/24/2021   INSULIN 13.1 06/29/2018   Lab Results  Component Value Date   TSH 1.520 04/24/2021   Lab Results  Component Value Date   CHOL 194 04/24/2021   HDL 54 04/24/2021   LDLCALC 126 (H) 04/24/2021   TRIG 76 04/24/2021   CHOLHDL 3.6 04/24/2021   Lab Results  Component Value Date   WBC 10.0 04/24/2021   HGB 12.2 04/24/2021   HCT 37.4 04/24/2021   MCV 86 04/24/2021   PLT 381 04/24/2021   Lab Results  Component Value Date   FERRITIN 23 03/19/2018   Lab Results  Component Value Date   VD25OH 10.7 (L) 04/24/2021   VD25OH 14 (L) 04/17/2020   VD25OH 22.3 (L) 06/29/2018    Attestation Statements:   Reviewed by clinician on day of visit: allergies, medications, problem list, medical history, surgical history, family history, social history, and previous encounter notes.

## 2021-09-05 ENCOUNTER — Telehealth (INDEPENDENT_AMBULATORY_CARE_PROVIDER_SITE_OTHER): Payer: Self-pay | Admitting: Family Medicine

## 2021-09-05 ENCOUNTER — Encounter (INDEPENDENT_AMBULATORY_CARE_PROVIDER_SITE_OTHER): Payer: Self-pay | Admitting: Family Medicine

## 2021-09-05 ENCOUNTER — Telehealth (INDEPENDENT_AMBULATORY_CARE_PROVIDER_SITE_OTHER): Payer: BC Managed Care – PPO | Admitting: Family Medicine

## 2021-09-05 ENCOUNTER — Encounter (INDEPENDENT_AMBULATORY_CARE_PROVIDER_SITE_OTHER): Payer: Self-pay

## 2021-09-05 DIAGNOSIS — R7303 Prediabetes: Secondary | ICD-10-CM

## 2021-09-05 DIAGNOSIS — Z6836 Body mass index (BMI) 36.0-36.9, adult: Secondary | ICD-10-CM | POA: Diagnosis not present

## 2021-09-05 DIAGNOSIS — Z7985 Long-term (current) use of injectable non-insulin antidiabetic drugs: Secondary | ICD-10-CM

## 2021-09-05 DIAGNOSIS — E559 Vitamin D deficiency, unspecified: Secondary | ICD-10-CM

## 2021-09-05 DIAGNOSIS — E669 Obesity, unspecified: Secondary | ICD-10-CM | POA: Diagnosis not present

## 2021-09-05 DIAGNOSIS — Z6833 Body mass index (BMI) 33.0-33.9, adult: Secondary | ICD-10-CM

## 2021-09-05 MED ORDER — BD PEN NEEDLE NANO 2ND GEN 32G X 4 MM MISC: 0 refills | Status: DC

## 2021-09-05 MED ORDER — SAXENDA 18 MG/3ML ~~LOC~~ SOPN: 3.0000 mg | PEN_INJECTOR | Freq: Every day | SUBCUTANEOUS | 0 refills | Status: DC

## 2021-09-05 NOTE — Telephone Encounter (Signed)
Dawn [employer] - Prior authorization approved for [employer]. Effective: 09/05/2021 to 01/05/2022. Patient sent approval message via mychart.

## 2021-09-13 ENCOUNTER — Other Ambulatory Visit: Payer: Self-pay | Admitting: Podiatry

## 2021-09-13 ENCOUNTER — Ambulatory Visit (INDEPENDENT_AMBULATORY_CARE_PROVIDER_SITE_OTHER): Payer: BC Managed Care – PPO | Admitting: Podiatry

## 2021-09-13 ENCOUNTER — Ambulatory Visit (INDEPENDENT_AMBULATORY_CARE_PROVIDER_SITE_OTHER): Payer: BC Managed Care – PPO

## 2021-09-13 ENCOUNTER — Encounter: Payer: Self-pay | Admitting: Podiatry

## 2021-09-13 DIAGNOSIS — M79672 Pain in left foot: Secondary | ICD-10-CM

## 2021-09-13 DIAGNOSIS — M7672 Peroneal tendinitis, left leg: Secondary | ICD-10-CM | POA: Diagnosis not present

## 2021-09-13 MED ORDER — MELOXICAM 15 MG PO TABS: 15.0000 mg | ORAL_TABLET | Freq: Every day | ORAL | 0 refills | Status: DC

## 2021-09-13 NOTE — Patient Instructions (Signed)
Peroneal Tendinopathy Rehab Ask your health care provider which exercises are safe for you. Do exercises exactly as told by your health care provider and adjust them as directed. It is normal to feel mild stretching, pulling, tightness, or discomfort as you do these exercises. Stop right away if you feel sudden pain or your pain gets worse. Do not begin these exercises until told by your health care provider. Stretching and range-of-motion exercises These exercises warm up your muscles and joints. They can help improve the movement and flexibility of your ankle. They may also help to relieve pain and stiffness. Gastrocnemius and soleus stretch, standing This is an exercise in which you stand on a step and use your body weight to stretch your calf muscles. To do this exercise: Stand on the edge of a step on the ball of your left / right foot. The ball of your foot is on the walking surface, right under your toes. Keep your other foot firmly on the same step. Hold on to the wall, a railing, or a chair for balance. Slowly lift your other foot, allowing your body weight to press your left / right heel down over the edge of the step. You should feel a stretch in your left / right calf (gastrocnemius and soleus). Hold this position for __________ seconds. Return both feet to the step. Repeat this exercise with a slight bend in your left / right knee. Repeat __________ times with your left / right knee straight and __________ times with your left / right knee bent. Complete this exercise __________ times a day. Strengthening exercises These exercises build strength and endurance in your foot and ankle. Endurance is the ability to use your muscles for a long time, even after they get tired. Ankle dorsiflexion with band  Secure a rubber exercise band or tube to an object, such as a table leg, that will not move when the band is pulled. Secure the other end of the band around your left / right foot. Sit on  the floor. Face the object with your left / right leg extended. The band or tube should be slightly tense when your foot is relaxed. Slowly flex your left / right ankle and toes to bring your foot toward you (dorsiflexion). Hold this position for __________ seconds. Let the band or tube slowly pull your foot back to the starting position. Repeat __________ times. Complete this exercise __________ times a day. Ankle eversion  Sit on the floor with your legs straight out in front of you. Loop a rubber exercise band or tube around the ball of your left / right foot. The ball of your foot is on the walking surface, right under your toes. Hold the ends of the band in your hands. You can also secure the band to a stable object. The band or tube should be slightly tense when your foot is relaxed. Slowly push your foot outward, away from your other leg (eversion). Hold this position for __________ seconds. Slowly return your foot to the starting position. Repeat __________ times. Complete this exercise __________ times a day. Plantar flexion, standing This exercise is sometimes called a standing heel raise. Stand with your feet shoulder-width apart. Place your hands on a wall or table to steady yourself as needed. Try not to use it for support. Keep your weight spread evenly over the width of your feet while you slowly rise up on your toes (plantar flexion). If told by your health care provider: Shift your weight   toward your left / right leg until you feel challenged. Stand on your left / right leg only. Hold this position for __________ seconds. Repeat __________ times. Complete this exercise __________ times a day. Single leg stand  Without shoes, stand near a railing or in a doorway. You may hold on to the railing or doorframe as needed. Stand on your left / right foot. Keep your big toe down on the floor and try to keep your arch lifted. Do not roll to the outside of your foot. If this  exercise is too easy, you can try it with your eyes closed or while standing on a pillow. Hold this position for __________ seconds. Repeat __________ times. Complete this exercise __________ times a day. This information is not intended to replace advice given to you by your health care provider. Make sure you discuss any questions you have with your health care provider. Document Revised: 04/18/2021 Document Reviewed: 04/18/2021 Elsevier Patient Education  2023 Elsevier Inc.  

## 2021-09-13 NOTE — Progress Notes (Signed)
  Subjective:  Patient ID: Katie Curtis, female    DOB: 11-12-1980,   MRN: [id number]  Chief Complaint  Patient presents with   Foot Pain    NP - L - outside of foot has throbbing pain    41 y.o. female presents for concern of pain on the outside of her left foot that has been going on for almost a week. Relates it came on suddenly this past Wednesday. States the pain is mostly on the outside of her ankle. Relates pain when walking. Has tried icing and icy hot and has been taking aleve.  Denies any other pedal complaints. Denies n/v/f/c.   Past Medical History:  Diagnosis Date   Abnormal Pap smear    Anemia    Anemia    Gallbladder problem    Pilonidal cyst    Prediabetes    SOBOE (shortness of breath on exertion)    Urinary incontinence    Vitamin D deficiency     Objective:  Physical Exam: Vascular: DP/PT pulses 2/4 bilateral. CFT <3 seconds. Normal hair growth on digits. No edema.  Skin. No lacerations or abrasions bilateral feet.  Musculoskeletal: MMT 5/5 bilateral lower extremities in DF, PF, Inversion and Eversion. Deceased ROM in DF of ankle joint. Tender along insertion of peroneal tendon and proximally along tendon to posterior lateral malleolus. Pain with plantarflexion and eversion of the foot.  Neurological: Sensation intact to light touch.   Assessment:   1. Peroneal tendonitis, left      Plan:  Patient was evaluated and treated and all questions answered. X-rays reviewed and discussed with patient. No acute fractures or dislocations noted.  Os peroneum noted.  Discussed peroneal tendinitis and treatment options at length with patient Discussed stretching exercises and provided handout. Prescription for meloxicam provided Dispensed Tri-Lock ankle brace. Discussed that if the symptoms do not improve can consider PT/MRI. Patient to return in 6 to 8 weeks or sooner if symptoms fail to improve or worsen.   Louann Sjogren, DPM

## 2021-09-17 ENCOUNTER — Ambulatory Visit (INDEPENDENT_AMBULATORY_CARE_PROVIDER_SITE_OTHER): Payer: BC Managed Care – PPO | Admitting: Family Medicine

## 2021-09-17 ENCOUNTER — Encounter (INDEPENDENT_AMBULATORY_CARE_PROVIDER_SITE_OTHER): Payer: Self-pay | Admitting: Family Medicine

## 2021-09-17 VITALS — BP 142/91 | HR 89 | Temp 98.0°F | Ht 64.0 in | Wt 225.0 lb

## 2021-09-17 DIAGNOSIS — E7800 Pure hypercholesterolemia, unspecified: Secondary | ICD-10-CM | POA: Insufficient documentation

## 2021-09-17 DIAGNOSIS — E559 Vitamin D deficiency, unspecified: Secondary | ICD-10-CM

## 2021-09-17 DIAGNOSIS — R7303 Prediabetes: Secondary | ICD-10-CM

## 2021-09-17 DIAGNOSIS — Z6838 Body mass index (BMI) 38.0-38.9, adult: Secondary | ICD-10-CM

## 2021-09-17 DIAGNOSIS — R030 Elevated blood-pressure reading, without diagnosis of hypertension: Secondary | ICD-10-CM | POA: Diagnosis not present

## 2021-09-17 DIAGNOSIS — E669 Obesity, unspecified: Secondary | ICD-10-CM

## 2021-09-19 ENCOUNTER — Ambulatory Visit (INDEPENDENT_AMBULATORY_CARE_PROVIDER_SITE_OTHER): Payer: BC Managed Care – PPO | Admitting: Family Medicine

## 2021-09-19 LAB — HEMOGLOBIN A1C
Est. average glucose Bld gHb Est-mCnc: 123 mg/dL
Hgb A1c MFr Bld: 5.9 % — ABNORMAL HIGH (ref 4.8–5.6)

## 2021-09-19 LAB — CMP14+EGFR
ALT: 24 IU/L (ref 0–32)
AST: 23 IU/L (ref 0–40)
Albumin/Globulin Ratio: 1.6 (ref 1.2–2.2)
Albumin: 4.1 g/dL (ref 3.9–4.9)
Alkaline Phosphatase: 84 IU/L (ref 44–121)
BUN/Creatinine Ratio: 16 (ref 9–23)
BUN: 10 mg/dL (ref 6–24)
Bilirubin Total: 0.3 mg/dL (ref 0.0–1.2)
CO2: 22 mmol/L (ref 20–29)
Calcium: 9.1 mg/dL (ref 8.7–10.2)
Chloride: 104 mmol/L (ref 96–106)
Creatinine, Ser: 0.64 mg/dL (ref 0.57–1.00)
Globulin, Total: 2.5 g/dL (ref 1.5–4.5)
Glucose: 101 mg/dL — ABNORMAL HIGH (ref 70–99)
Potassium: 4 mmol/L (ref 3.5–5.2)
Sodium: 139 mmol/L (ref 134–144)
Total Protein: 6.6 g/dL (ref 6.0–8.5)
eGFR: 112 mL/min/{1.73_m2} (ref >59)

## 2021-09-19 LAB — INSULIN, RANDOM: INSULIN: 31.2 u[IU]/mL — ABNORMAL HIGH (ref 2.6–24.9)

## 2021-09-19 LAB — VITAMIN B12: Vitamin B-12: 420 pg/mL (ref 232–1245)

## 2021-09-19 LAB — LIPID PANEL WITH LDL/HDL RATIO
Cholesterol, Total: 170 mg/dL (ref 100–199)
HDL: 51 mg/dL (ref >39)
LDL Chol Calc (NIH): 106 mg/dL — ABNORMAL HIGH (ref 0–99)
LDL/HDL Ratio: 2.1 ratio (ref 0.0–3.2)
Triglycerides: 68 mg/dL (ref 0–149)
VLDL Cholesterol Cal: 13 mg/dL (ref 5–40)

## 2021-09-19 LAB — VITAMIN D 25 HYDROXY (VIT D DEFICIENCY, FRACTURES): Vit D, 25-Hydroxy: 35.3 ng/mL (ref 30.0–100.0)

## 2021-09-23 NOTE — Progress Notes (Unsigned)
Chief Complaint:   OBESITY Katie Curtis is here to discuss her progress with her obesity treatment plan along with follow-up of her obesity related diagnoses. Katie Curtis is on the Category 2 Plan and keeping a food journal and adhering to recommended goals of 400-500 calories and 35 grams of protein and states she is following her eating plan approximately 50-60% of the time. Katie Curtis states she is doing 0 minutes 0 times per week.  Today's visit was #: 11 Starting weight: 218 lbs Starting date: 04/24/2021 Today's weight: 225 lbs Today's date: 09/17/2021 Total lbs lost to date: 0 Total lbs lost since last in-office visit: 0  Interim History: Katie Curtis has gotten off track with her eating plan and exercise due to foot pain and being especially busy.  Subjective:   1. Elevated blood-pressure reading without diagnosis of hypertension Katie Curtis's blood pressure is elevated today, but normally controlled.  She is not on medications.  2. Prediabetes Katie Curtis is going to start on a GLP-1, and she is working on her diet.  She is due to have labs.  3. Vitamin D deficiency Katie Curtis is on vitamin D, and she is due for labs.  She denies nausea, vomiting, or muscle weakness.  4. Pure hypercholesterolemia Katie Curtis is not on statin and she is working on her diet.  She is due for labs.  Assessment/Plan:   1. Elevated blood-pressure reading without diagnosis of hypertension We will check labs today. Shrinika will continue working on her diet and weight loss.   2. Prediabetes We will check labs today. Katie Curtis will continue to work on weight loss, exercise, and decreasing simple carbohydrates to help decrease the risk of diabetes.   - CMP14+EGFR - Vitamin B12 - Hemoglobin A1c - Insulin, random  3. Vitamin D deficiency We will check labs today. Katie Curtis will follow-up for routine testing of Vitamin D, at least 2-3 times per year to avoid over-replacement.  - VITAMIN D 25 Hydroxy (Vit-D Deficiency, Fractures)  4.  Pure hypercholesterolemia We will check labs today. Cardiovascular risk and specific lipid/LDL goals reviewed.  We discussed several lifestyle modifications today. Katie Curtis will continue to work on diet, exercise and weight loss efforts. Orders and follow up as documented in patient record.   - Lipid Panel With LDL/HDL Ratio  5. Obesity: Current BMI 38.7 Katie Curtis is currently in the action stage of change. As such, her goal is to continue with weight loss efforts. She has agreed to change to keeping a food journal and adhering to recommended goals of 1100-1400 calories and 80+ grams of protein daily.   Behavioral modification strategies: increasing lean protein intake.  Katie Curtis has agreed to follow-up with our clinic in 2 to 3 weeks. She was informed of the importance of frequent follow-up visits to maximize her success with intensive lifestyle modifications for her multiple health conditions.   Katie Curtis was informed we would discuss her lab results at her next visit unless there is a critical issue that needs to be addressed sooner. Katie Curtis agreed to keep her next visit at the agreed upon time to discuss these results.  Objective:   Blood pressure (!) 142/91, pulse 89, temperature 98 F (36.7 C), height '5\' 4"'  (1.626 m), weight 225 lb (102.1 kg), SpO2 98 %. Body mass index is 38.62 kg/m.  General: Cooperative, alert, well developed, in no acute distress. HEENT: Conjunctivae and lids unremarkable. Cardiovascular: Regular rhythm.  Lungs: Normal work of breathing. Neurologic: No focal deficits.   Lab Results  Component Value Date  CREATININE 0.64 09/17/2021   BUN 10 09/17/2021   NA 139 09/17/2021   K 4.0 09/17/2021   CL 104 09/17/2021   CO2 22 09/17/2021   Lab Results  Component Value Date   ALT 24 09/17/2021   AST 23 09/17/2021   ALKPHOS 84 09/17/2021   BILITOT 0.3 09/17/2021   Lab Results  Component Value Date   HGBA1C 5.9 (H) 09/17/2021   HGBA1C 5.9 (H) 04/24/2021   HGBA1C 6.1  (H) 04/17/2020   HGBA1C 5.7 (H) 03/21/2019   HGBA1C 5.7 (H) 06/29/2018   Lab Results  Component Value Date   INSULIN 31.2 (H) 09/17/2021   INSULIN 32.8 (H) 04/24/2021   INSULIN 13.1 06/29/2018   Lab Results  Component Value Date   TSH 1.520 04/24/2021   Lab Results  Component Value Date   CHOL 170 09/17/2021   HDL 51 09/17/2021   LDLCALC 106 (H) 09/17/2021   TRIG 68 09/17/2021   CHOLHDL 3.6 04/24/2021   Lab Results  Component Value Date   VD25OH 35.3 09/17/2021   VD25OH 10.7 (L) 04/24/2021   VD25OH 14 (L) 04/17/2020   Lab Results  Component Value Date   WBC 10.0 04/24/2021   HGB 12.2 04/24/2021   HCT 37.4 04/24/2021   MCV 86 04/24/2021   PLT 381 04/24/2021   Lab Results  Component Value Date   FERRITIN 23 03/19/2018   Attestation Statements:   Reviewed by clinician on day of visit: allergies, medications, problem list, medical history, surgical history, family history, social history, and previous encounter notes.  Pt total visit time including pre-visit review and post visit documentation was 74 minutes  I, Trixie Dredge, am acting as transcriptionist for Dennard Nip, MD.  I have reviewed the above documentation for accuracy and completeness, and I agree with the above. -  Dennard Nip, MD

## 2021-10-01 NOTE — Progress Notes (Signed)
TeleHealth Visit:  This visit was completed with telemedicine (audio/video) technology. Katie Curtis has verbally consented to this TeleHealth visit. The patient is located at home, the provider is located at home. The participants in this visit include the listed provider and patient. The visit was conducted today via MyChart video.  OBESITY Katie Curtis is here to discuss her progress with her obesity treatment plan along with follow-up of her obesity related diagnoses.   Today's visit was # 12 Starting weight: 218 lbs Starting date: 04/24/2021 Weight at last in office visit: 225 lbs on 09/17/21 Total weight loss: 0 lbs at last in office visit on 09/17/21. Today's reported weight: *** lbs No weight reported.   Nutrition Plan: keeping a food journal and adhering to recommended goals of 1100-1400 calories and 80 + gms protein daily. Hunger is {EWCONTROLASSESSMENT:24261}. Cravings are {EWCONTROLASSESSMENT:24261}.  Current exercise: {exercise types:16438}  Interim History: ***  Assessment/Plan:  1. ***  2. ***  3. ***  Obesity: Current BMI *** [name] {CHL AMB IS/IS NOT:210130109} currently in the action stage of change. As such, her goal is to {MWMwtloss#1:210800005}.  She has agreed to {MWMwtlossportion/plan2:23431}.   Exercise goals: {MWM EXERCISE RECS:23473}  Behavioral modification strategies: {MWMwtlossdietstrategies3:23432}.  [name] has agreed to follow-up with our clinic in {NUMBER 1-10:22536} weeks.   No orders of the defined types were placed in this encounter.   There are no discontinued medications.   No orders of the defined types were placed in this encounter.     Objective:   VITALS: Per patient if applicable, see vitals. GENERAL: Alert and in no acute distress. CARDIOPULMONARY: No increased WOB. Speaking in clear sentences.  PSYCH: Pleasant and cooperative. Speech normal rate and rhythm. Affect is appropriate. Insight and judgement are appropriate.  Attention is focused, linear, and appropriate.  NEURO: Oriented as arrived to appointment on time with no prompting.   Lab Results  Component Value Date   CREATININE 0.64 09/17/2021   BUN 10 09/17/2021   NA 139 09/17/2021   K 4.0 09/17/2021   CL 104 09/17/2021   CO2 22 09/17/2021   Lab Results  Component Value Date   ALT 24 09/17/2021   AST 23 09/17/2021   ALKPHOS 84 09/17/2021   BILITOT 0.3 09/17/2021   Lab Results  Component Value Date   HGBA1C 5.9 (H) 09/17/2021   HGBA1C 5.9 (H) 04/24/2021   HGBA1C 6.1 (H) 04/17/2020   HGBA1C 5.7 (H) 03/21/2019   HGBA1C 5.7 (H) 06/29/2018   Lab Results  Component Value Date   INSULIN 31.2 (H) 09/17/2021   INSULIN 32.8 (H) 04/24/2021   INSULIN 13.1 06/29/2018   Lab Results  Component Value Date   TSH 1.520 04/24/2021   Lab Results  Component Value Date   CHOL 170 09/17/2021   HDL 51 09/17/2021   LDLCALC 106 (H) 09/17/2021   TRIG 68 09/17/2021   CHOLHDL 3.6 04/24/2021   Lab Results  Component Value Date   WBC 10.0 04/24/2021   HGB 12.2 04/24/2021   HCT 37.4 04/24/2021   MCV 86 04/24/2021   PLT 381 04/24/2021   Lab Results  Component Value Date   FERRITIN 23 03/19/2018   Lab Results  Component Value Date   VD25OH 35.3 09/17/2021   VD25OH 10.7 (L) 04/24/2021   VD25OH 14 (L) 04/17/2020    Attestation Statements:   Reviewed by clinician on day of visit: allergies, medications, problem list, medical history, surgical history, family history, social history, and previous encounter notes.  ***(delete if time-based billing  not used) Time spent on visit including the items listed below was *** minutes.  -preparing to see the patient (e.g., review of tests, history, previous notes) -obtaining and/or reviewing separately obtained history -counseling and educating the patient/family/caregiver -documenting clinical information in the electronic or other health record -ordering medications, tests, or  procedures -independently interpreting results and communicating results to the patient/ family/caregiver -referring and communicating with other health care professionals  -care coordination

## 2021-10-02 ENCOUNTER — Other Ambulatory Visit (HOSPITAL_COMMUNITY): Payer: Self-pay

## 2021-10-02 ENCOUNTER — Telehealth (INDEPENDENT_AMBULATORY_CARE_PROVIDER_SITE_OTHER): Payer: BC Managed Care – PPO | Admitting: Family Medicine

## 2021-10-02 ENCOUNTER — Encounter (INDEPENDENT_AMBULATORY_CARE_PROVIDER_SITE_OTHER): Payer: Self-pay | Admitting: Family Medicine

## 2021-10-02 DIAGNOSIS — Z6838 Body mass index (BMI) 38.0-38.9, adult: Secondary | ICD-10-CM

## 2021-10-02 DIAGNOSIS — R7303 Prediabetes: Secondary | ICD-10-CM

## 2021-10-02 DIAGNOSIS — E669 Obesity, unspecified: Secondary | ICD-10-CM

## 2021-10-02 DIAGNOSIS — E7800 Pure hypercholesterolemia, unspecified: Secondary | ICD-10-CM

## 2021-10-02 MED ORDER — SAXENDA 18 MG/3ML ~~LOC~~ SOPN
3.0000 mg | PEN_INJECTOR | Freq: Every day | SUBCUTANEOUS | 0 refills | Status: DC
  Filled 2021-10-02: qty 15, 30d supply, fill #0

## 2021-10-02 MED ORDER — BD PEN NEEDLE NANO 2ND GEN 32G X 4 MM MISC
0 refills | Status: AC
  Filled 2021-10-02 (×2): qty 100, 90d supply, fill #0

## 2021-10-08 ENCOUNTER — Other Ambulatory Visit (INDEPENDENT_AMBULATORY_CARE_PROVIDER_SITE_OTHER): Payer: Self-pay | Admitting: Family Medicine

## 2021-10-08 DIAGNOSIS — E559 Vitamin D deficiency, unspecified: Secondary | ICD-10-CM

## 2021-10-10 ENCOUNTER — Other Ambulatory Visit: Payer: Self-pay | Admitting: Podiatry

## 2021-10-15 ENCOUNTER — Ambulatory Visit (INDEPENDENT_AMBULATORY_CARE_PROVIDER_SITE_OTHER): Payer: BC Managed Care – PPO | Admitting: Family Medicine

## 2021-10-25 ENCOUNTER — Encounter: Payer: Self-pay | Admitting: Podiatrist

## 2021-10-25 ENCOUNTER — Ambulatory Visit (INDEPENDENT_AMBULATORY_CARE_PROVIDER_SITE_OTHER): Payer: BC Managed Care – PPO | Admitting: Podiatrist

## 2021-10-25 DIAGNOSIS — M7672 Peroneal tendinitis, left leg: Secondary | ICD-10-CM

## 2021-10-25 NOTE — Progress Notes (Signed)
Chief Complaint  Patient presents with   Foot Pain    Patient states foot pain is much better since the last visit , but the pain is still there     HPI: Patient is 41 y.o. female who presents today for recheck of foot pain of the left foot. She relates the pain has improved but it is not well.  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  Relates her foot still has some pain when she stands.    Allergies  Allergen Reactions   Hydrocodone    Oxycodone    Penicillins Nausea And Vomiting    Review of systems is negative except as noted in the HPI.  Denies nausea/ vomiting/ fevers/ chills or night sweats.   Denies difficulty breathing, denies calf pain or tenderness  Physical Exam  Patient is awake, alert, and oriented x 3.  In no acute distress.    Vascular status is intact with palpable pedal pulses DP and PT bilateral and capillary refill time less than 3 seconds bilateral.  No edema or erythema noted.   Neurological exam reveals epicritic and protective sensation grossly intact bilateral.   Dermatological exam reveals skin is supple and dry to bilateral feet.  No open lesions present.    Musculoskeletal exam: pain lateral left foot at insertion of the peroneal tendon near fifth metatarsal base is noted.  No pain posterior to malleolus.      Assessment:   ICD-10-CM   1. Peroneal tendonitis, left  M76.72        Plan: Discussed treatment recommendations and exam findings.  Recommended an injection to calm down the area of tenderness. She agreed and this was carried out today with 4mg  Dexamethasone and 0.5%marcaine plain superficial to the peroneal tendoon on the left foot.  Recommended a supportive ankle brace and good sneakers for the next few days.  Also recommended she try a boot if her foot pain is not improved after a week.  She will call for further  evaluation and possible referral to PT if no improvement noted.

## 2021-10-25 NOTE — Patient Instructions (Signed)
If no improvement noticed in 2 or so weeks, try a fracture boot/ or walking boot.  You can call our office and we are happy to provide one for you.    We can also try physical therapy if you would like that as well.  Just call and I can set that up for you.

## 2021-10-31 ENCOUNTER — Ambulatory Visit (INDEPENDENT_AMBULATORY_CARE_PROVIDER_SITE_OTHER): Payer: BC Managed Care – PPO | Admitting: Family Medicine

## 2021-11-26 ENCOUNTER — Telehealth (INDEPENDENT_AMBULATORY_CARE_PROVIDER_SITE_OTHER): Payer: BC Managed Care – PPO | Admitting: Family Medicine

## 2022-01-07 ENCOUNTER — Other Ambulatory Visit (INDEPENDENT_AMBULATORY_CARE_PROVIDER_SITE_OTHER): Payer: Self-pay | Admitting: Family Medicine

## 2022-01-07 ENCOUNTER — Other Ambulatory Visit (HOSPITAL_COMMUNITY): Payer: Self-pay

## 2022-01-07 ENCOUNTER — Telehealth (INDEPENDENT_AMBULATORY_CARE_PROVIDER_SITE_OTHER): Payer: Self-pay | Admitting: Family Medicine

## 2022-01-07 DIAGNOSIS — E669 Obesity, unspecified: Secondary | ICD-10-CM

## 2022-01-07 NOTE — Telephone Encounter (Signed)
Your demographic data has been sent to [employer] successfully!  [employer] typically takes 5-10 minutes to respond, but it may take a little longer in some cases. You will be notified by email when available. You can also check for an update later by opening this request from your dashboard. Please do not fax or call [employer] to resubmit this request. If you need assistance, please chat with [employer] or call us at [phone].  If it has been longer than 24 hours, please reach out to [employer]. 

## 2022-01-07 NOTE — Telephone Encounter (Signed)
PA has been submitted for [employer] via CoverMyMeds, awaiting a response.  (Key: [id number]) [employer] is processing your PA request and will respond shortly with next steps. You are currently using the fastest method to process this prior authorization. Please do not fax or call [employer] to resubmit this request. To check for an update later, open this request again from your dashboard.

## 2022-01-08 ENCOUNTER — Other Ambulatory Visit (HOSPITAL_COMMUNITY): Payer: Self-pay

## 2022-01-08 MED ORDER — SAXENDA 18 MG/3ML ~~LOC~~ SOPN
3.0000 mg | PEN_INJECTOR | Freq: Every day | SUBCUTANEOUS | 0 refills | Status: DC
  Filled 2022-01-08: qty 15, 30d supply, fill #0

## 2022-01-14 ENCOUNTER — Ambulatory Visit
Admission: RE | Admit: 2022-01-14 | Discharge: 2022-01-14 | Disposition: A | Discharge status: Home / Self Care | Payer: BC Managed Care – PPO | Source: Ambulatory Visit | Attending: Orthopaedic Surgery | Admitting: Orthopaedic Surgery

## 2022-01-14 ENCOUNTER — Other Ambulatory Visit: Payer: Self-pay | Admitting: Orthopaedic Surgery

## 2022-01-14 DIAGNOSIS — M79672 Pain in left foot: Secondary | ICD-10-CM

## 2022-01-22 ENCOUNTER — Other Ambulatory Visit (HOSPITAL_COMMUNITY): Payer: Self-pay

## 2022-04-28 ENCOUNTER — Encounter (HOSPITAL_COMMUNITY): Payer: Self-pay | Admitting: Obstetrics and Gynecology

## 2022-04-28 ENCOUNTER — Other Ambulatory Visit (HOSPITAL_COMMUNITY): Payer: Self-pay | Admitting: Obstetrics and Gynecology

## 2022-04-28 ENCOUNTER — Telehealth: Payer: Self-pay | Admitting: Obstetrics and Gynecology

## 2022-04-28 DIAGNOSIS — N6320 Unspecified lump in the left breast, unspecified quadrant: Secondary | ICD-10-CM

## 2022-04-28 DIAGNOSIS — Z1231 Encounter for screening mammogram for malignant neoplasm of breast: Secondary | ICD-10-CM

## 2022-04-28 NOTE — Telephone Encounter (Signed)
Please contact patient regarding her breast health.   I received an order to sign for a diagnostic mammogram and ultrasound due to a breast mass.   Please have the patient make an office visit with me for evaluation.  This will need to be completed before I can sign off an orders for breast imaging.

## 2022-04-29 NOTE — Telephone Encounter (Signed)
Call to patient, patient states now not a good time to talk, she will return call at a later time to further discuss.

## 2022-04-30 NOTE — Telephone Encounter (Signed)
I declined the order for diagnostic breast imaging with [facility] and indicated that I need to see the patient for evaluation first.

## 2022-05-08 ENCOUNTER — Encounter (HOSPITAL_COMMUNITY): Payer: BC Managed Care – PPO

## 2022-05-08 ENCOUNTER — Encounter (HOSPITAL_COMMUNITY): Payer: Self-pay

## 2022-05-08 ENCOUNTER — Other Ambulatory Visit (HOSPITAL_COMMUNITY): Payer: BC Managed Care – PPO

## 2022-05-08 ENCOUNTER — Ambulatory Visit (HOSPITAL_COMMUNITY)
Admission: RE | Admit: 2022-05-08 | Discharge: 2022-05-08 | Disposition: A | Discharge status: Home / Self Care | Payer: BC Managed Care – PPO | Source: Ambulatory Visit | Attending: Obstetrics and Gynecology | Admitting: Obstetrics and Gynecology

## 2022-05-08 DIAGNOSIS — N6320 Unspecified lump in the left breast, unspecified quadrant: Secondary | ICD-10-CM

## 2022-05-08 NOTE — Telephone Encounter (Signed)
Patient called because diagnostic breast orders were declined by Dr. Edward Jolly. I explained that Dr. Edward Jolly recommends OV for breast exam and then she will order any diagnostic breast imaging needed. I did remind patient that Chapman Fitch., RN had called her to discuss this but she could not talk and told her she would call back when she could.  Patient is agreeable to scheduling breast exam and message sent to appt desk to schedule.

## 2022-05-13 NOTE — Telephone Encounter (Signed)
Comments  May 7-VM full    I sent patient a My Chart message and let her know that our appointment team tried to call her to arrange breast exam however her voice mail box was full. I asked her to call asap to schedule breast exam.

## 2022-05-14 ENCOUNTER — Encounter: Payer: Self-pay | Admitting: Obstetrics and Gynecology

## 2022-05-23 ENCOUNTER — Telehealth: Payer: Self-pay | Admitting: Obstetrics and Gynecology

## 2022-05-23 NOTE — Telephone Encounter (Signed)
Dr Edward Jolly wants patient to come in for a breast check. Try calling her voicemail is full mailed letter on May 8 for patient to call and schedule appointment; patient has not called back.

## 2022-05-28 NOTE — Telephone Encounter (Signed)
Call to patient, mailbox full, unable to leave message.  

## 2022-06-09 NOTE — Telephone Encounter (Signed)
Refer to telephone encounter dated 05/23/2022.

## 2022-06-19 NOTE — Telephone Encounter (Signed)
Call to patient, mailbox full, unable to leave message.  

## 2022-07-28 NOTE — Telephone Encounter (Signed)
Call to patient, left detailed message, ok per dpr. Advised f/u on the recommendation for breast check with Dr. Edward Jolly. Please return call to office at [phone] to provide an update on symptoms, or to schedule an OV with Dr. Edward Jolly.

## 2022-07-30 NOTE — Telephone Encounter (Signed)
No response from patient.   MyChart message to patient.

## 2022-08-01 NOTE — Telephone Encounter (Signed)
No response from patient.   Routing to Dr. Edward Jolly to review and advise.

## 2022-08-06 NOTE — Telephone Encounter (Signed)
Patient has been advised to schedule an office visit for a breast check.   Ok to close encounter.

## 2022-08-07 NOTE — Telephone Encounter (Signed)
Reviewed and closed.  

## 2022-08-21 ENCOUNTER — Other Ambulatory Visit (HOSPITAL_COMMUNITY): Payer: Self-pay | Admitting: Obstetrics and Gynecology

## 2022-08-21 DIAGNOSIS — Z1231 Encounter for screening mammogram for malignant neoplasm of breast: Secondary | ICD-10-CM

## 2022-08-29 ENCOUNTER — Ambulatory Visit (HOSPITAL_COMMUNITY): Payer: BC Managed Care – PPO

## 2022-08-29 ENCOUNTER — Ambulatory Visit (HOSPITAL_COMMUNITY): Admission: RE | Admit: 2022-08-29 | Payer: BC Managed Care – PPO | Source: Ambulatory Visit

## 2022-08-29 DIAGNOSIS — Z1231 Encounter for screening mammogram for malignant neoplasm of breast: Secondary | ICD-10-CM | POA: Diagnosis present

## 2023-07-21 ENCOUNTER — Other Ambulatory Visit: Payer: Self-pay

## 2023-07-21 ENCOUNTER — Emergency Department (HOSPITAL_COMMUNITY)

## 2023-07-21 ENCOUNTER — Observation Stay (HOSPITAL_COMMUNITY)
Admission: EM | Admit: 2023-07-21 | Discharge: 2023-07-22 | Disposition: A | Discharge status: Home / Self Care | Attending: Emergency Medicine | Admitting: Emergency Medicine

## 2023-07-21 ENCOUNTER — Encounter (HOSPITAL_COMMUNITY)
Admission: EM | Disposition: A | Discharge status: Home / Self Care | Payer: Self-pay | Source: Home / Self Care | Attending: Emergency Medicine

## 2023-07-21 ENCOUNTER — Encounter (HOSPITAL_COMMUNITY): Payer: Self-pay

## 2023-07-21 ENCOUNTER — Observation Stay (HOSPITAL_COMMUNITY): Admitting: Anesthesiology

## 2023-07-21 DIAGNOSIS — F1090 Alcohol use, unspecified, uncomplicated: Secondary | ICD-10-CM | POA: Insufficient documentation

## 2023-07-21 DIAGNOSIS — K8020 Calculus of gallbladder without cholecystitis without obstruction: Principal | ICD-10-CM | POA: Diagnosis present

## 2023-07-21 DIAGNOSIS — K810 Acute cholecystitis: Secondary | ICD-10-CM | POA: Diagnosis not present

## 2023-07-21 DIAGNOSIS — R112 Nausea with vomiting, unspecified: Secondary | ICD-10-CM | POA: Diagnosis present

## 2023-07-21 DIAGNOSIS — K8000 Calculus of gallbladder with acute cholecystitis without obstruction: Principal | ICD-10-CM | POA: Insufficient documentation

## 2023-07-21 LAB — HEPATITIS PANEL, ACUTE
HCV Ab: NONREACTIVE
Hep A IgM: NONREACTIVE
Hep B C IgM: NONREACTIVE
Hepatitis B Surface Ag: NONREACTIVE

## 2023-07-21 LAB — COMPREHENSIVE METABOLIC PANEL WITH GFR
ALT: 681 U/L — ABNORMAL HIGH (ref 0–44)
AST: 963 U/L — ABNORMAL HIGH (ref 15–41)
Albumin: 3.9 g/dL (ref 3.5–5.0)
Alkaline Phosphatase: 100 U/L (ref 38–126)
Anion gap: 12 (ref 5–15)
BUN: 7 mg/dL (ref 6–20)
CO2: 22 mmol/L (ref 22–32)
Calcium: 9.1 mg/dL (ref 8.9–10.3)
Chloride: 100 mmol/L (ref 98–111)
Creatinine, Ser: 0.65 mg/dL (ref 0.44–1.00)
GFR, Estimated: >60 mL/min (ref >60)
Glucose, Bld: 153 mg/dL — ABNORMAL HIGH (ref 70–99)
Potassium: 3.8 mmol/L (ref 3.5–5.1)
Sodium: 134 mmol/L — ABNORMAL LOW (ref 135–145)
Total Bilirubin: 1 mg/dL (ref 0.0–1.2)
Total Protein: 7.6 g/dL (ref 6.5–8.1)

## 2023-07-21 LAB — CBC
HCT: 34.5 % — ABNORMAL LOW (ref 36.0–46.0)
Hemoglobin: 11.2 g/dL — ABNORMAL LOW (ref 12.0–15.0)
MCH: 29 pg (ref 26.0–34.0)
MCHC: 32.5 g/dL (ref 30.0–36.0)
MCV: 89.4 fL (ref 80.0–100.0)
Platelets: 373 K/uL (ref 150–400)
RBC: 3.86 MIL/uL — ABNORMAL LOW (ref 3.87–5.11)
RDW: 13.4 % (ref 11.5–15.5)
WBC: 8.7 K/uL (ref 4.0–10.5)
nRBC: 0 % (ref 0.0–0.2)

## 2023-07-21 LAB — LIPASE, BLOOD: Lipase: 31 U/L (ref 11–51)

## 2023-07-21 LAB — PROTIME-INR
INR: 1 (ref 0.8–1.2)
Prothrombin Time: 13.7 s (ref 11.4–15.2)

## 2023-07-21 LAB — POCT PREGNANCY, URINE: Preg Test, Ur: NEGATIVE

## 2023-07-21 LAB — HCG, QUANTITATIVE, PREGNANCY: hCG, Beta Chain, Quant, S: <1 m[IU]/mL (ref <5)

## 2023-07-21 SURGERY — CHOLECYSTECTOMY, ROBOT-ASSISTED, LAPAROSCOPIC
Anesthesia: General | Site: Abdomen

## 2023-07-21 MED ORDER — ORAL CARE MOUTH RINSE: 15.0000 mL | Freq: Once | OROMUCOSAL | Status: AC

## 2023-07-21 MED ORDER — SODIUM CHLORIDE 0.9 % IV SOLN
2.0000 g | INTRAVENOUS | Status: DC
  Administered 2023-07-21: 2 g via INTRAVENOUS
  Filled 2023-07-21: qty 20

## 2023-07-21 MED ORDER — LACTATED RINGERS IV SOLN: INTRAVENOUS | Status: DC

## 2023-07-21 MED ORDER — SUGAMMADEX SODIUM 200 MG/2ML IV SOLN
INTRAVENOUS | Status: DC | PRN
  Administered 2023-07-21: 300 mg via INTRAVENOUS

## 2023-07-21 MED ORDER — DEXAMETHASONE SODIUM PHOSPHATE 10 MG/ML IJ SOLN
INTRAMUSCULAR | Status: AC
Start: 2023-07-21 — End: 2023-07-21
  Filled 2023-07-21: qty 1

## 2023-07-21 MED ORDER — SODIUM CHLORIDE 0.9 % IV SOLN
INTRAVENOUS | Status: AC
  Filled 2023-07-21: qty 2

## 2023-07-21 MED ORDER — SUCCINYLCHOLINE CHLORIDE 200 MG/10ML IV SOSY
PREFILLED_SYRINGE | INTRAVENOUS | Status: AC
  Filled 2023-07-21: qty 10

## 2023-07-21 MED ORDER — SCOPOLAMINE 1 MG/3DAYS TD PT72
MEDICATED_PATCH | TRANSDERMAL | Status: AC
  Administered 2023-07-21: 1.5 mg via TRANSDERMAL
  Filled 2023-07-21: qty 1

## 2023-07-21 MED ORDER — METOPROLOL TARTRATE 5 MG/5ML IV SOLN
INTRAVENOUS | Status: AC
Start: 2023-07-21 — End: 2023-07-21
  Filled 2023-07-21: qty 5

## 2023-07-21 MED ORDER — CHLORHEXIDINE GLUCONATE 0.12 % MT SOLN
15.0000 mL | Freq: Once | OROMUCOSAL | Status: AC
  Administered 2023-07-21: 15 mL via OROMUCOSAL

## 2023-07-21 MED ORDER — MIDAZOLAM HCL 2 MG/2ML IJ SOLN
INTRAMUSCULAR | Status: AC
  Filled 2023-07-21: qty 2

## 2023-07-21 MED ORDER — MIDAZOLAM HCL 2 MG/2ML IJ SOLN
INTRAMUSCULAR | Status: DC | PRN
Start: 2023-07-21 — End: 2023-07-21
  Administered 2023-07-21: 2 mg via INTRAVENOUS

## 2023-07-21 MED ORDER — SODIUM CHLORIDE 0.9 % IV SOLN: INTRAVENOUS | Status: DC

## 2023-07-21 MED ORDER — SODIUM CHLORIDE 0.9 % IV SOLN
2.0000 g | INTRAVENOUS | Status: AC
  Administered 2023-07-21: 2 g via INTRAVENOUS

## 2023-07-21 MED ORDER — LIDOCAINE 2% (20 MG/ML) 5 ML SYRINGE
INTRAMUSCULAR | Status: DC | PRN
  Administered 2023-07-21: 100 mg via INTRAVENOUS

## 2023-07-21 MED ORDER — ONDANSETRON HCL 4 MG/2ML IJ SOLN
INTRAMUSCULAR | Status: AC
  Filled 2023-07-21: qty 2

## 2023-07-21 MED ORDER — BUPIVACAINE HCL (PF) 0.5 % IJ SOLN
INTRAMUSCULAR | Status: AC
Start: 2023-07-21 — End: 2023-07-21
  Filled 2023-07-21: qty 30

## 2023-07-21 MED ORDER — ENOXAPARIN SODIUM 40 MG/0.4ML IJ SOSY: 40.0000 mg | PREFILLED_SYRINGE | INTRAMUSCULAR | Status: DC

## 2023-07-21 MED ORDER — LACTATED RINGERS IV BOLUS
1000.0000 mL | Freq: Once | INTRAVENOUS | Status: AC
  Administered 2023-07-21: 1000 mL via INTRAVENOUS

## 2023-07-21 MED ORDER — ONDANSETRON HCL 4 MG/2ML IJ SOLN
4.0000 mg | Freq: Once | INTRAMUSCULAR | Status: AC
  Administered 2023-07-21: 4 mg via INTRAVENOUS
  Filled 2023-07-21: qty 2

## 2023-07-21 MED ORDER — ONDANSETRON 4 MG PO TBDP: 4.0000 mg | ORAL_TABLET | Freq: Four times a day (QID) | ORAL | Status: DC | PRN

## 2023-07-21 MED ORDER — PROPOFOL 10 MG/ML IV BOLUS
INTRAVENOUS | Status: DC | PRN
  Administered 2023-07-21: 200 mg via INTRAVENOUS

## 2023-07-21 MED ORDER — FENTANYL CITRATE (PF) 100 MCG/2ML IJ SOLN
50.0000 ug | Freq: Once | INTRAMUSCULAR | Status: AC
  Administered 2023-07-21: 50 ug via INTRAVENOUS
  Filled 2023-07-21: qty 2

## 2023-07-21 MED ORDER — ONDANSETRON HCL 4 MG/2ML IJ SOLN: 4.0000 mg | Freq: Four times a day (QID) | INTRAMUSCULAR | Status: DC | PRN

## 2023-07-21 MED ORDER — INDOCYANINE GREEN 25 MG IV SOLR
INTRAVENOUS | Status: AC
  Administered 2023-07-21: 2.5 mg via INTRAVENOUS
  Filled 2023-07-21: qty 10

## 2023-07-21 MED ORDER — CHLORHEXIDINE GLUCONATE CLOTH 2 % EX PADS: 6.0000 | MEDICATED_PAD | Freq: Once | CUTANEOUS | Status: DC

## 2023-07-21 MED ORDER — OXYCODONE HCL 5 MG PO TABS: 5.0000 mg | ORAL_TABLET | ORAL | Status: DC | PRN

## 2023-07-21 MED ORDER — LABETALOL HCL 5 MG/ML IV SOLN
5.0000 mg | INTRAVENOUS | Status: DC | PRN
  Administered 2023-07-21: 5 mg via INTRAVENOUS

## 2023-07-21 MED ORDER — ACETAMINOPHEN 500 MG PO TABS
1000.0000 mg | ORAL_TABLET | Freq: Four times a day (QID) | ORAL | Status: DC
Start: 2023-07-21 — End: 2023-07-22
  Administered 2023-07-21 – 2023-07-22 (×4): 1000 mg via ORAL
  Filled 2023-07-21 (×4): qty 2

## 2023-07-21 MED ORDER — KETOROLAC TROMETHAMINE 30 MG/ML IJ SOLN
INTRAMUSCULAR | Status: DC | PRN
  Administered 2023-07-21: 30 mg via INTRAVENOUS

## 2023-07-21 MED ORDER — INDOCYANINE GREEN 25 MG IV SOLR: 2.5000 mg | Freq: Once | INTRAVENOUS | Status: AC

## 2023-07-21 MED ORDER — MORPHINE SULFATE (PF) 2 MG/ML IV SOLN
2.0000 mg | INTRAVENOUS | Status: DC | PRN
  Administered 2023-07-21: 2 mg via INTRAVENOUS
  Filled 2023-07-21: qty 1

## 2023-07-21 MED ORDER — MORPHINE SULFATE (PF) 2 MG/ML IV SOLN
1.0000 mg | INTRAVENOUS | Status: DC | PRN
  Administered 2023-07-21 (×2): 2 mg via INTRAVENOUS
  Filled 2023-07-21 (×2): qty 1

## 2023-07-21 MED ORDER — STERILE WATER FOR IRRIGATION IR SOLN
Status: DC | PRN
  Administered 2023-07-21: 1000 mL

## 2023-07-21 MED ORDER — ROCURONIUM BROMIDE 10 MG/ML (PF) SYRINGE
PREFILLED_SYRINGE | INTRAVENOUS | Status: AC
  Filled 2023-07-21: qty 10

## 2023-07-21 MED ORDER — SCOPOLAMINE 1 MG/3DAYS TD PT72: 1.0000 | MEDICATED_PATCH | Freq: Once | TRANSDERMAL | Status: DC

## 2023-07-21 MED ORDER — SODIUM CHLORIDE 0.9 % IR SOLN
Status: DC | PRN
  Administered 2023-07-21: 3000 mL

## 2023-07-21 MED ORDER — KETOROLAC TROMETHAMINE 30 MG/ML IJ SOLN
INTRAMUSCULAR | Status: AC
  Filled 2023-07-21: qty 1

## 2023-07-21 MED ORDER — BUPIVACAINE HCL (PF) 0.5 % IJ SOLN
INTRAMUSCULAR | Status: DC | PRN
  Administered 2023-07-21: 30 mL

## 2023-07-21 MED ORDER — CHLORHEXIDINE GLUCONATE CLOTH 2 % EX PADS
6.0000 | MEDICATED_PAD | Freq: Once | CUTANEOUS | Status: AC
  Administered 2023-07-21: 6 via TOPICAL

## 2023-07-21 MED ORDER — ROCURONIUM BROMIDE 10 MG/ML (PF) SYRINGE
PREFILLED_SYRINGE | INTRAVENOUS | Status: DC | PRN
Start: 2023-07-21 — End: 2023-07-21
  Administered 2023-07-21: 15 mg via INTRAVENOUS
  Administered 2023-07-21: 50 mg via INTRAVENOUS

## 2023-07-21 MED ORDER — SUCCINYLCHOLINE CHLORIDE 200 MG/10ML IV SOSY
PREFILLED_SYRINGE | INTRAVENOUS | Status: DC | PRN
  Administered 2023-07-21: 160 mg via INTRAVENOUS

## 2023-07-21 MED ORDER — FENTANYL CITRATE (PF) 250 MCG/5ML IJ SOLN
INTRAMUSCULAR | Status: DC | PRN
  Administered 2023-07-21: 100 ug via INTRAVENOUS
  Administered 2023-07-21 (×3): 50 ug via INTRAVENOUS

## 2023-07-21 MED ORDER — FENTANYL CITRATE (PF) 250 MCG/5ML IJ SOLN
INTRAMUSCULAR | Status: AC
  Filled 2023-07-21: qty 5

## 2023-07-21 MED ORDER — KETAMINE HCL 50 MG/5ML IJ SOSY
PREFILLED_SYRINGE | INTRAMUSCULAR | Status: DC | PRN
Start: 2023-07-21 — End: 2023-07-21
  Administered 2023-07-21: 50 mg via INTRAVENOUS

## 2023-07-21 MED ORDER — LABETALOL HCL 5 MG/ML IV SOLN
INTRAVENOUS | Status: AC
Start: 2023-07-21 — End: 2023-07-21
  Filled 2023-07-21: qty 4

## 2023-07-21 MED ORDER — KETAMINE HCL 50 MG/5ML IJ SOSY
PREFILLED_SYRINGE | INTRAMUSCULAR | Status: AC
  Filled 2023-07-21: qty 5

## 2023-07-21 MED ORDER — DEXAMETHASONE SODIUM PHOSPHATE 10 MG/ML IJ SOLN
INTRAMUSCULAR | Status: DC | PRN
  Administered 2023-07-21: 10 mg via INTRAVENOUS

## 2023-07-21 MED ORDER — PROPOFOL 10 MG/ML IV BOLUS
INTRAVENOUS | Status: AC
  Filled 2023-07-21: qty 20

## 2023-07-21 MED ORDER — METOPROLOL TARTRATE 5 MG/5ML IV SOLN
INTRAVENOUS | Status: DC | PRN
  Administered 2023-07-21 (×3): 1 mg via INTRAVENOUS

## 2023-07-21 MED ORDER — ONDANSETRON HCL 4 MG/2ML IJ SOLN
INTRAMUSCULAR | Status: DC | PRN
  Administered 2023-07-21: 4 mg via INTRAVENOUS

## 2023-07-21 MED ORDER — SODIUM CHLORIDE 0.9 % IV SOLN: 12.5000 mg | INTRAVENOUS | Status: DC | PRN

## 2023-07-21 SURGICAL SUPPLY — 43 items
BLADE SURG 15 STRL LF DISP TIS (BLADE) ×1 IMPLANT
CAUTERY HOOK MNPLR 1.6 DVNC XI (INSTRUMENTS) ×1 IMPLANT
CHLORAPREP W/TINT 26 (MISCELLANEOUS) ×1 IMPLANT
CLIP LIGATING HEM O LOK PURPLE (MISCELLANEOUS) ×1 IMPLANT
COVER LIGHT HANDLE (MISCELLANEOUS) IMPLANT
DEFOGGER SCOPE WARMER CLEARIFY (MISCELLANEOUS) ×1 IMPLANT
DERMABOND ADVANCED .7 DNX12 (GAUZE/BANDAGES/DRESSINGS) ×1 IMPLANT
DRAPE ARM DVNC X/XI (DISPOSABLE) ×4 IMPLANT
DRAPE COLUMN DVNC XI (DISPOSABLE) ×1 IMPLANT
ELECTRODE REM PT RTRN 9FT ADLT (ELECTROSURGICAL) ×1 IMPLANT
FORCEPS BPLR R/ABLATION 8 DVNC (INSTRUMENTS) ×1 IMPLANT
FORCEPS PROGRASP DVNC XI (FORCEP) ×1 IMPLANT
GLOVE BIO SURGEON STRL SZ7.5 (GLOVE) IMPLANT
GLOVE BIOGEL PI IND STRL 6.5 (GLOVE) ×2 IMPLANT
GLOVE BIOGEL PI IND STRL 7.0 (GLOVE) ×3 IMPLANT
GLOVE BIOGEL PI IND STRL 8 (GLOVE) IMPLANT
GLOVE SURG SS PI 6.5 STRL IVOR (GLOVE) ×2 IMPLANT
GOWN STRL REUS W/TWL LRG LVL3 (GOWN DISPOSABLE) ×3 IMPLANT
GRASPER SUT TROCAR 14GX15 (MISCELLANEOUS) ×1 IMPLANT
IRRIGATOR SUCT 8 DISP DVNC XI (IRRIGATION / IRRIGATOR) IMPLANT
KIT TURNOVER KIT A (KITS) ×1 IMPLANT
MANIFOLD NEPTUNE II (INSTRUMENTS) ×1 IMPLANT
NDL HYPO 21X1.5 SAFETY (NEEDLE) ×1 IMPLANT
NDL INSUFFLATION 14GA 120MM (NEEDLE) ×1 IMPLANT
NEEDLE HYPO 21X1.5 SAFETY (NEEDLE) ×1 IMPLANT
NEEDLE INSUFFLATION 14GA 120MM (NEEDLE) ×1 IMPLANT
OBTURATOR OPTICALSTD 8 DVNC (TROCAR) ×1 IMPLANT
PACK LAP CHOLE LZT030E (CUSTOM PROCEDURE TRAY) ×1 IMPLANT
PAD ARMBOARD POSITIONER FOAM (MISCELLANEOUS) ×1 IMPLANT
PENCIL HANDSWITCHING (ELECTRODE) ×1 IMPLANT
POSITIONER HEAD 8X9X4 ADT (SOFTGOODS) ×1 IMPLANT
RELOAD STAPLE 30 2.5 WHT DVNC (STAPLE) IMPLANT
SEAL UNIV 5-12 XI (MISCELLANEOUS) ×4 IMPLANT
SET BASIN LINEN APH (SET/KITS/TRAYS/PACK) ×1 IMPLANT
SET TUBE SMOKE EVAC HIGH FLOW (TUBING) ×1 IMPLANT
SOL .9 NS 3000ML IRR UROMATIC (IV SOLUTION) IMPLANT
STAPLER 30 CRVD 8 SUREFORM (STAPLE) IMPLANT
STAPLER RELOAD 30 WHITE 8 (STAPLE) ×1 IMPLANT
SUT MNCRL AB 4-0 PS2 18 (SUTURE) ×2 IMPLANT
SUT VICRYL 0 UR6 27IN ABS (SUTURE) ×1 IMPLANT
SYR 30ML LL (SYRINGE) ×1 IMPLANT
SYSTEM RETRIEVL 5MM INZII UNIV (BASKET) ×1 IMPLANT
WATER STERILE IRR 500ML POUR (IV SOLUTION) ×1 IMPLANT

## 2023-07-21 NOTE — Progress Notes (Signed)
 Patient was able to get OOB and ambulate to the bathroom with a steady gait.

## 2023-07-21 NOTE — Anesthesia Procedure Notes (Signed)
 Procedure Name: Intubation Date/Time: 07/21/2023 1:08 PM  Performed by: Augusta Daved SAILOR, CRNAPre-anesthesia Checklist: Patient identified, Emergency Drugs available, Suction available and Patient being monitored Patient Re-evaluated:Patient Re-evaluated prior to induction Oxygen Delivery Method: Circle System Utilized Preoxygenation: Pre-oxygenation with 100% oxygen Induction Type: IV induction, Cricoid Pressure applied and Rapid sequence Laryngoscope Size: Glidescope and 3 Grade View: Grade II Tube type: Oral Number of attempts: 1 Airway Equipment and Method: Stylet and Oral airway Placement Confirmation: ETT inserted through vocal cords under direct vision, positive ETCO2 and breath sounds checked- equal and bilateral Secured at: 21 cm Tube secured with: Tape Dental Injury: Teeth and Oropharynx as per pre-operative assessment

## 2023-07-21 NOTE — Discharge Instructions (Addendum)
 Surgery Discharge Instructions  **GO TO [facility] TOMORROW, 7/17, FOR BLOOD WORK  General Anesthesia or Sedation Do not drive or operate heavy machinery for 24 hours.  Do not consume alcohol, tranquilizers, sleeping medications, or any non-prescribed medications for 24 hours. Do not make important decisions or sign any important papers in the next 24 hours. You should have someone with you tonight at home.  Activity  You are advised to go directly home from the hospital.  Restrict your activities and rest for a day.  Resume light activity tomorrow. No heavy lifting over 10 lbs or strenuous exercise.  Fluids and Diet Regular diet  Medications  If you have not had a bowel movement in 24 hours, take 2 tablespoons over the counter Milk of mag.             You May resume your blood thinners tomorrow (Aspirin, coumadin, or other).  You are being discharged with prescriptions for Opioid/Narcotic Medications: There are some specific considerations for these medications that you should know. Opioid Meds have risks & benefits. Addiction to these meds is always a concern with prolonged use Take medication only as directed Do not drive while taking narcotic pain medication Do not crush tablets or capsules Do not use a different container than medication was dispensed in Lock the container of medication in a cool, dry place out of reach of children and pets. Opioid medication can cause addiction Do not share with anyone else (this is a felony) Do not store medications for future use. Dispose of them properly.     Disposal:  Find a [place]  household drug take back site near you.  If you can't get to a drug take back site, use the recipe below as a last resort to dispose of expired, unused or unwanted drugs. Disposal  (Do not dispose chemotherapy drugs this way, talk to your prescribing doctor instead.) Step 1: Mix drugs (do not crush) with dirt, kitty litter, or used coffee  grounds and add a small amount of water  to dissolve any solid medications. Step 2: Seal drugs in plastic bag. Step 3: Place plastic bag in trash. Step 4: Take prescription container and scratch out personal information, then recycle or throw away.  Operative Site  You have a liquid bandage over your incisions, this will begin to flake off in about a week. Ok to English as a second language teacher. Keep wound clean and dry. No baths or swimming. No lifting more than 10 pounds.  Contact Information: If you have questions or concerns, please call our office, [phone], Monday- Thursday 8AM-5PM and Friday 8AM-12Noon.  If it is after hours or on the weekend, please call [facility] Main Number, [phone], and ask to speak to the surgeon on call for Dr. Evonnie at [facility].   SPECIFIC COMPLICATIONS TO WATCH FOR: Inability to urinate Fever over 101? F by mouth Nausea and vomiting lasting longer than 24 hours. Pain not relieved by medication ordered Swelling around the operative site Increased redness, warmth, hardness, around operative area Numbness, tingling, or cold fingers or toes Blood -soaked dressing, (small amounts of oozing may be normal) Increasing and progressive drainage from surgical area or exam site    Providers Accepting New Patients in [place], [place]    [facility] [address]  [place], [place] [address] [phone] Accepts most insurances  [facility] [address] [place], [place] [address] [phone] Accepts most insurances  [facility] [address]  [address] [place], [place] [address]  [phone] Must meet requirements  [facility] [address] [place], [place] [address] [phone] Accepts most insurances  [facility] [address]  [place], [place] [address] [phone] Accepts most insurances  [facility] [address]   [place], [place]   [phone] Accepts  most insurances  [facility])  [phone] [address]  [place], [place] [address] [phone] Accepts most insurances     [facility] [address]  [place], [place] [address] [phone] Accepts most insurances  [facility] [address] [place], [place] [address] [phone] option 1 Accepts Medicaid and [facility] [address]  [place], [place] [address] [phone] Accepts most insurances  Benita Outhouse, MD [address] [place], [place] [address] [phone] Accepts most insurances  [facility] [address]  [place], [place] [address] [phone] Accepts most insurances  [facility] [phone] [address] [place], [place] [address] [phone] Accepts most insurances  [place] [place] [address] [place], [place] [address] [phone]  Accepts most insurances

## 2023-07-21 NOTE — Transfer of Care (Signed)
 Immediate Anesthesia Transfer of Care Note  Patient: Katie Curtis  Procedure(s) Performed: CHOLECYSTECTOMY, ROBOT-ASSISTED, LAPAROSCOPIC (Abdomen)  Patient Location: PACU  Anesthesia Type:General  Level of Consciousness: drowsy  Airway & Oxygen Therapy: Patient Spontanous Breathing and Patient connected to face mask oxygen  Post-op Assessment: Report given to RN and Post -op Vital signs reviewed and stable  Post vital signs: Reviewed and stable  Last Vitals:  Vitals Value Taken Time  BP 176/104   Temp 97.6   Pulse 84 07/21/23 15:16  Resp 18 07/21/23 15:16  SpO2 100 % 07/21/23 15:16  Vitals shown include unfiled device data.  Last Pain:  Vitals:   07/21/23 1229  TempSrc: Oral  PainSc: 5          Complications: No notable events documented.

## 2023-07-21 NOTE — Op Note (Signed)
 [facility] Operative Note  07/21/23  Preoperative Diagnosis: Cholelithiasis, biliary colic   Postoperative Diagnosis: Acute cholecystitis, empyema of the gallbladder   Procedure(s) Performed: Robotic Assisted Laparoscopic Cholecystectomy   Surgeon: [name], DO   Assistants: No qualified resident was available    Anesthesia: General endotracheal   Anesthesiologist: Dr. Landry    Specimens: Gallbladder   Estimated Blood Loss: 30 cc   Blood Replacement: None    Complications: None   Wound Class: Dirty/Infected   Operative Indications: The patient presented to the emergency department with a 1 day history of worsening right upper quadrant abdominal pain, nausea, and vomiting.  On ultrasound she was demonstrated to have a distended gallbladder with some mild wall thickening and a stone in the neck of the gallbladder, equivocal for acute cholecystitis.  She had no leukocytosis, but elevation of her AST and ALT.  We discussed the risk of the procedure including but not limited to bleeding, infection, injury to the common bile duct, bile leak, need for further procedures, chance of subtotal cholecystectomy.  Patient is agreeable to surgery.  Findings:  Acute cholecystitis of the gallbladder with associated empyema of the gallbladder Critical view of safety noted with only the infundibulum of the gallbladder or other structures entering the Staple line intact at the end of the case Adequate hemostasis   Procedure: Firefly was given in the preoperative area.  The patient was taken to the operating room and placed supine. General endotracheal anesthesia was induced. Intravenous antibiotics were administered per protocol.  An orogastric tube positioned to decompress the stomach. The abdomen was prepared and draped in the usual sterile fashion. A time-out was completed verifying correct patient, procedure, site, positioning, and implant(s) and/or special  equipment prior to beginning this procedure.  Veress needle was placed at the infraumbilical area and insufflation was started after confirming a positive saline drop test and no immediate increase in abdominal pressure.  After reaching 15 mm, the Veress needle was removed and a 8 mm port was placed via optiview technique infraumbilical, measuring 20 mm away from the suspected position of the gallbladder.  The abdomen was inspected and no abnormalities or injuries were found.  Under direct vision, ports were placed in the following locations in a semi curvilinear position around the target of the gallbladder: Two 8 mm ports on the patient's right each having 8cm clearance to the adjacent ports and one 8 mm port placed on the patient's left 8 cm from the umbilical port. Once ports were placed, the table was placed in the reverse Trendelenburg position with the right side up. The Xi platform was brought into the operative field and docked to the ports successfully.  An endoscope was placed through the umbilical port, prograsp through the most lateral right port, fenestrated bipolar to the port just right of the umbilicus, and then a hook cautery in the left port.  The dome of the gallbladder was grasped with prograsp and retracted over the dome of the liver. Adhesions between the gallbladder and omentum, duodenum and transverse colon were lysed via hook cautery.  The gallbladder was noted to be significantly distended and acutely inflamed.  The infundibulum was grasped with the fenestrated grasper and retracted toward the right lower quadrant. This maneuver exposed Calot's triangle. Firefly was used throughout the dissection to ensure safe visualization of the cystic duct.  The peritoneum overlying the gallbladder infundibulum was then dissected and the infundibulum of the gallbladder and cystic artery identified.  During lateral traction  of the infundibulum, there is a small hole created in the body of the  gallbladder, and purulent drainage was noted, consistent with an empyema of the gallbladder.  The cystic artery was taken down with electrocautery during dissection to allow for complete visualization of the infundibulum of the gallbladder.  The infundibulum of the gallbladder was dilated, and there was significant chronic adhesions within the triangle of Calot.  No stones were palpable within the infundibulum.  No other structures were noted to be entering the gallbladder, and the lower one third of the gallbladder was dissected off of the gallbladder fossa.  Critical view of safety with the liver bed clearly visible behind the infundibulum of the gallbladder with no additional structures noted to be entering the gallbladder.  Decision was made to divide the gallbladder over the infundibulum to decrease risk of injury to the common bile duct with the chronic adhesions noted within the triangle of Calot.  The white load GIA stapler was then fired across the infundibulum of the gallbladder.    The gallbladder was then dissected from its peritoneal and liver bed attachments by electrocautery. Hemostasis was checked prior to removing the hook cautery.  The Anton was undocked and moved out of the field.  A 5mm Endo Catch bag was then placed through the umbilical port and the gallbladder was removed.  The gallbladder was passed off the table as a specimen. There was no evidence of bleeding from the gallbladder fossa or cystic artery or leakage of the bile from the staple line.  The gallbladder fossa was irrigated with warm saline.  The umbilical port site closed with a 0 vicryl with a PMI needle.  The abdomen was desufflated and secondary trocars were removed under direct vision. No bleeding was noted. Incisions were localized with marcaine .  All skin incisions were closed with subcuticular sutures of 4-0 monocryl and dermabond.    Final inspection revealed acceptable hemostasis. All counts were correct at the end of  the case. The patient was awakened from anesthesia and extubated without complication. The OG tube was removed.  The patient went to the PACU in stable condition.   [name], DO [facility] [address] [place], [place] [address] [phone] (office)

## 2023-07-21 NOTE — Progress Notes (Signed)
 [facility]  Spoke with the patient's son and mother in the consultation room.  I explained that she tolerated the procedure without difficulty.  She has dissolvable stitches under the skin with overlying skin glue.  This will flake off in 10 to 14 days.  I recommend that the patient stay in the hospital tonight to receive IV antibiotics and to recheck blood work tomorrow.  If she is tolerating a diet and pain is controlled with oral medications, she will be stable for discharge home tomorrow with a prescription for antibiotics.  The patient will follow-up with me in 2 weeks.  All questions were answered to their expressed satisfaction.  Plan: -Patient to be transferred to the MedSurg floor, observation order in place -Continue IV Rocephin  given empyema of gallbladder noted during surgery -Regular diet -NS at 40 cc/hr -AM labs -PRN pain control and antiemetics -Hopeful for discharge home in the next 24 to 48 hours pending progression postoperatively  [name], DO [facility] [address] [place], [place] [address] [phone] (office)

## 2023-07-21 NOTE — Anesthesia Postprocedure Evaluation (Signed)
 Anesthesia Post Note  Patient: Katie Curtis  Procedure(s) Performed: CHOLECYSTECTOMY, ROBOT-ASSISTED, LAPAROSCOPIC (Abdomen)  Patient location during evaluation: PACU Anesthesia Type: General Level of consciousness: awake and alert Pain management: pain level controlled Vital Signs Assessment: post-procedure vital signs reviewed and stable Respiratory status: spontaneous breathing, nonlabored ventilation, respiratory function stable and patient connected to nasal cannula oxygen Cardiovascular status: blood pressure returned to baseline and stable Postop Assessment: no apparent nausea or vomiting Anesthetic complications: no   There were no known notable events for this encounter.   Last Vitals:  Vitals:   07/21/23 1545 07/21/23 1600  BP: (!) 168/98 (!) 167/90  Pulse: 83 76  Resp: (!) 22 19  Temp:  36.7 C  SpO2: 96% 94%    Last Pain:  Vitals:   07/21/23 1555  TempSrc:   PainSc: 8                  Monti Villers L [name]

## 2023-07-21 NOTE — H&P (Signed)
 [facility] History and Physical  Reason for Referral: Right-sided abdominal pain, vomiting Referring Physician: Dr. Freddi  Chief Complaint   Nausea; Emesis     Katie Curtis is a 43 y.o. female.  HPI: Patient presents with a 1 day history of right sided abdominal pain with 3 episodes of emesis.  She has had a history of right sided abdominal pain in the past, and even followed with Dr. Kallie in 2022 for this pain.  Since that time, she has been modifying her diet to try to help her with her pain control.  She started having similar right upper quadrant pain episodes in December and has had several of them in the last 7 months.  The reason she presented to the emergency department with this episode was the pain persisted overnight and she had episodes of emesis associated with it.  She is still having pain in her right upper quadrant and is feeling a little nauseous, though she has not vomited since being in the emergency department.  She has a past medical history significant for prediabetes.  She denies use of blood thinning medications.  She was previously on Wegovy, but has not taken this in over 3 months.  She denies any history of surgeries on her abdomen.  In the ED, she was hemodynamically stable.  She had no leukocytosis, but elevation in her AST and ALT of 963 and 681 respectively.  Her total bilirubin and alkaline phosphatase are normal.  On abdominal ultrasound she was noted to have a dilated gallbladder with stones towards the neck of her gallbladder with mild wall thickening.  Her CBD was enlarged at 10 mm, but was previously 9 mm on ultrasound in 2022.  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    Family History  Problem Relation Age of Onset   Depression Mother    Sleep apnea Mother    Alcoholism Mother    Hypertension Father    Congestive Heart Failure Father        had been on dialysis x 10 years   Diabetes Father    High Cholesterol Father    Heart disease Father    Kidney disease Father    Cancer Father    Obesity Father    Stroke Maternal Grandmother    Lung cancer Paternal Grandmother        smoker   Hypertension Paternal Grandmother    Hypothyroidism Paternal Grandmother    Hypertension Paternal Grandfather    Kidney failure Paternal Grandfather     Social History   Tobacco Use   Smoking status: Never   Smokeless tobacco: Never  Vaping Use   Vaping status: Never Used  Substance Use Topics   Alcohol use: Yes    Alcohol/week: 0.0 - 1.0 standard drinks of alcohol    Comment: socially--1 drink every 2-3 months   Drug use: No    Medications: I have reviewed the patient's current medications. Allergies as of 07/21/2023       Reactions   Hydrocodone    Oxycodone   Penicillins Nausea And Vomiting       ROS:  Pertinent items are noted in HPI.  Blood pressure (!) 155/96, pulse 90, temperature 98.5 F (36.9 C), temperature source Oral, resp. rate 20, height 5' 4 (1.626 m), weight 93 kg, last menstrual period 07/16/2023, SpO2 98%. Physical Exam Vitals reviewed.  Constitutional:      Appearance: Normal appearance.  HENT:     Head: Normocephalic and atraumatic.  Eyes:     Extraocular Movements: Extraocular movements intact.     Pupils: Pupils are equal, round, and reactive to light.  Cardiovascular:     Rate and Rhythm: Normal rate.  Pulmonary:     Effort: Pulmonary effort is normal.  Abdominal:     Comments: Abdomen soft, nondistended, no percussion tenderness, tenderness to palpation in the right upper quadrant; no rigidity, guarding, rebound tenderness; negative Murphy sign  Musculoskeletal:        General:  Normal range of motion.     Cervical back: Normal range of motion.  Skin:    General: Skin is warm and dry.  Neurological:     General: No focal deficit present.     Mental Status: She is alert and oriented to person, place, and time.  Psychiatric:        Mood and Affect: Mood normal.        Behavior: Behavior normal.     Results: Results for orders placed or performed during the hospital encounter of 07/21/23 (from the past 48 hours)  Lipase, blood     Status: None   Collection Time: 07/21/23  7:41 AM  Result Value Ref Range   Lipase 31 11 - 51 U/L    Comment: Performed at [facility], [address] [place], [place] [address]  Comprehensive metabolic panel     Status: Abnormal   Collection Time: 07/21/23  7:41 AM  Result Value Ref Range   Sodium 134 (L) 135 - 145 mmol/L   Potassium 3.8 3.5 - 5.1 mmol/L   Chloride 100 98 - 111 mmol/L   CO2 22 22 - 32 mmol/L   Glucose, Bld 153 (H) 70 - 99 mg/dL    Comment: Glucose reference range applies only to samples taken after fasting for at least 8 hours.   BUN 7 6 - 20 mg/dL   Creatinine, Ser 9.34 0.44 - 1.00 mg/dL   Calcium 9.1 8.9 - 89.6 mg/dL   Total Protein 7.6 6.5 - 8.1 g/dL   Albumin 3.9 3.5 - 5.0 g/dL   AST 036 (H) 15 - 41 U/L   ALT 681 (H) 0 - 44 U/L   Alkaline Phosphatase 100 38 - 126 U/L   Total Bilirubin 1.0 0.0 - 1.2 mg/dL   GFR, Estimated >39 >39 mL/min    Comment: (NOTE) Calculated using the CKD-EPI Creatinine Equation (2021)    Anion gap 12 5 - 15    Comment: Performed at [facility], [address], [place], [place] [address]  CBC     Status: Abnormal   Collection Time: 07/21/23  7:41 AM  Result Value Ref Range   WBC 8.7 4.0 - 10.5 K/uL   RBC 3.86 (L) 3.87 - 5.11 MIL/uL   Hemoglobin 11.2 (L) 12.0 - 15.0 g/dL   HCT 65.4 (L) 63.9 - 53.9 %   MCV 89.4 80.0 - 100.0 fL   MCH 29.0 26.0 - 34.0 pg   MCHC 32.5 30.0 - 36.0 g/dL   RDW 86.5 88.4 - 84.4 %   Platelets  373 150 - 400 K/uL   nRBC 0.0 0.0 - 0.2 %     Comment: Performed at [facility], [address] [place], [place] [address]  Protime-INR     Status: None   Collection Time: 07/21/23 10:01 AM  Result Value Ref Range   Prothrombin Time 13.7 11.4 - 15.2 seconds   INR 1.0 0.8 - 1.2    Comment: (NOTE) INR goal varies based on device and disease states. Performed at [facility], [address], [place], [place] [address]     US  Abdomen Limited RUQ (LIVER/GB) Result Date: 07/21/2023 CLINICAL DATA:  Right upper quadrant pain for 12 hours EXAM: ULTRASOUND ABDOMEN LIMITED RIGHT UPPER QUADRANT COMPARISON:  Ultrasound 10/28/2020 FINDINGS: Gallbladder: Dilated gallbladder. Multiple shadowing stones. Stones also towards the neck. Slight gallbladder wall thickening of 4 mm. No adjacent fluid. The sonographer does report pain when scanning the gallbladder. Common bile duct: Diameter: Enlarged at 10 mm. However previously was measured at 9 mm in 2022. some ectasia of the intrahepatic biliary tree Liver: No focal lesion identified. Within normal limits in parenchymal echogenicity. Portal vein is patent on color Doppler imaging with normal direction of blood flow towards the liver. Other: Some limitation with overlapping bowel gas and soft tissue. IMPRESSION: Dilated gallbladder with stones including a stone towards the neck. There is also a reported Murphy's sign and mild gallbladder wall thickening. Please correlate with clinical findings of acute cholecystitis and if needed confirmatory HIDA scan when clinically appropriate. Ectatic common duct but was seen previously. Please correlate for known history otherwise confirmatory study could be performed as well when appropriate. Electronically Signed   By: Ranell Bring M.D.   On: 07/21/2023 09:55     Assessment & Plan:  Katie Curtis is a 43 y.o. female who presents with a 1 day history of right upper quadrant abdominal pain, nausea, and vomiting.  Imaging and blood work evaluated by myself.  -We discussed  the pathophysiology of gallbladder disease, and I explained that she has likely been having issues with her gallbladder since 2022.  While her imaging and blood work is equivocal for acute cholecystitis, I recommend that we proceed with cholecystectomy at this time -I discussed that she would likely be admitted overnight with plan for repeat blood work tomorrow morning.  If she feels very well after surgery, she could potentially go home later today with plans for repeat blood work at follow-up visit in 1 to 2 weeks. -I have a low threshold for choledocholithiasis, as her CBD was previously dilated and her bilirubin is normal -I counseled the patient about the indication, risks and benefits of robotic assisted laparoscopic cholecystectomy.  She understands there is a very small chance for bleeding, infection, injury to normal structures (including common bile duct), conversion to open surgery, persistent symptoms, evolution of postcholecystectomy diarrhea, need for secondary interventions, anesthesia reaction, cardiopulmonary issues and other risks not specifically detailed here. I described the expected recovery, the plan for follow-up and the restrictions during the recovery phase.  All questions were answered. -NPO -IVF  -Prophylactic cefotetan  -PRN pain control and antiemetics -Further recommendations to follow surgery  All questions were answered to the satisfaction of the patient.  Note: Portions of this report may have been transcribed using voice recognition software. Every effort has been made to ensure accuracy; however, inadvertent computerized transcription errors may still be present.   [name], DO [facility] [address] [place], [place] [address] [phone] (office)

## 2023-07-21 NOTE — TOC CM/SW Note (Signed)
 Transition of Care [facility]) - Inpatient Brief Assessment   Patient Details  Name: Katie Curtis MRN: [id number] Date of Birth: 1980/03/02  Transition of Care [facility]) CM/SW Contact:    Noreen [name], LCSWA Phone Number: 07/21/2023, 12:21 PM   Clinical Narrative:  Transition of Care Department [facility]) has reviewed patient and no TOC needs have been identified at this time. We will continue to monitor patient advancement through interdisciplinary progression rounds. If new patient transition needs arise, please place a TOC consult.  Transition of Care Asessment: Insurance and Status: Insurance coverage has been reviewed Patient has primary care physician: No (PCP list added to AVS) Home environment has been reviewed: Single Family Home Prior level of function:: Independent Prior/Current Home Services: No current home services Social Drivers of Health Review: SDOH reviewed no interventions necessary Readmission risk has been reviewed: Yes Transition of care needs: no transition of care needs at this time

## 2023-07-21 NOTE — ED Triage Notes (Signed)
 Pt c/o dull pain the RUQ, N/V x3, food or dye present with each so uncertain if bilious or other; pt states pain 10/10, onset last night after meal; pt denies headache, dizziness; A&Ox4.

## 2023-07-21 NOTE — Anesthesia Preprocedure Evaluation (Signed)
 Anesthesia Evaluation  Patient identified by MRN, date of birth, ID band Patient awake    Reviewed: Allergy & Precautions, H&P , NPO status , Patient's Chart, lab work & pertinent test results, reviewed documented beta blocker date and time   Airway Mallampati: II  TM Distance: >3 FB Neck ROM: full    Dental no notable dental hx. (+) Dental Advisory Given, Teeth Intact   Pulmonary neg pulmonary ROS   Pulmonary exam normal breath sounds clear to auscultation       Cardiovascular Exercise Tolerance: Good negative cardio ROS Normal cardiovascular exam Rhythm:regular Rate:Normal     Neuro/Psych negative neurological ROS  negative psych ROS   GI/Hepatic Cholelithiasis with elevated liver enzymes   Endo/Other  negative endocrine ROS    Renal/GU negative Renal ROS  negative genitourinary   Musculoskeletal   Abdominal  (+) + obese  Peds  Hematology negative hematology ROS (+)   Anesthesia Other Findings   Reproductive/Obstetrics negative OB ROS                              Anesthesia Physical Anesthesia Plan  ASA: 2  Anesthesia Plan: General   Post-op Pain Management: Dilaudid IV   Induction: Intravenous  PONV Risk Score and Plan: Ondansetron , Dexamethasone , Scopolamine  patch - Pre-op and Midazolam   Airway Management Planned: Oral ETT  Additional Equipment: None  Intra-op Plan:   Post-operative Plan: Extubation in OR  Informed Consent: I have reviewed the patients History and Physical, chart, labs and discussed the procedure including the risks, benefits and alternatives for the proposed anesthesia with the patient or authorized representative who has indicated his/her understanding and acceptance.     Dental Advisory Given  Plan Discussed with: CRNA  Anesthesia Plan Comments:         Anesthesia Quick Evaluation

## 2023-07-21 NOTE — ED Provider Notes (Signed)
 [place] EMERGENCY DEPARTMENT AT [facility] Provider Note   CSN: [id number] Arrival date & time: 07/21/23  0720     Patient presents with: Nausea and Emesis   Katie Curtis is a 43 y.o. female.   HPI 43 year old female presents with right upper quadrant abdominal pain.  Started last night.  She has had 3 episodes of nonbloody emesis.  No diarrhea, fever, chest pain, shortness of breath, or cough.  Pain is currently about a 9 out of 10.  Seems to wrap around to her back.  Patient has had issues with pain in a similar area on and off for over a year but never this bad.  Patient endorses that this past weekend she drank a couple alcoholic drinks but only estimates about 2 mixed drinks for the entire weekend.  Otherwise has no significant alcohol history and while she does use Tylenol  if she gets significant pain, she never uses it more than once a day.  Prior to Admission medications   Medication Sig Start Date End Date Taking? Authorizing Provider  Insulin  Pen Needle (BD PEN NEEDLE NANO 2ND GEN) 32G X 4 MM MISC Use 1 needle daily to inject medication. 10/02/21   Whitmire, Dawn, FNP  Liraglutide  -Weight Management (SAXENDA ) 18 MG/3ML SOPN Inject 3 mg into the skin daily. 01/08/22   Whitmire, Dawn, FNP  meloxicam  (MOBIC ) 15 MG tablet TAKE 1 TABLET(15 MG) BY MOUTH DAILY 10/10/21   McDonald, Juliene SAUNDERS, DPM  Vitamin D , Ergocalciferol , (DRISDOL ) 1.25 MG (50000 UNIT) CAPS capsule Take 1 capsule (50,000 Units total) by mouth every 7 (seven) days. 08/15/21   Verdon Parry D, MD    Allergies: Hydrocodone, Oxycodone , and Penicillins    Review of Systems  Constitutional:  Negative for fever.  Respiratory:  Negative for cough and shortness of breath.   Cardiovascular:  Negative for chest pain.  Gastrointestinal:  Positive for abdominal pain, nausea and vomiting. Negative for diarrhea.    Updated Vital Signs BP (!) 155/96   Pulse 90   Temp 98.5 F (36.9 C) (Oral)   Resp 20   Ht 5' 4  (1.626 m)   Wt 93 kg   LMP 07/16/2023 (Approximate)   SpO2 98%   BMI 35.19 kg/m   Physical Exam Vitals and nursing note reviewed.  Constitutional:      Appearance: She is well-developed.  HENT:     Head: Normocephalic and atraumatic.  Cardiovascular:     Rate and Rhythm: Normal rate and regular rhythm.     Heart sounds: Normal heart sounds.  Pulmonary:     Effort: Pulmonary effort is normal.     Breath sounds: Normal breath sounds.  Abdominal:     Palpations: Abdomen is soft.     Tenderness: There is abdominal tenderness in the right upper quadrant.  Skin:    General: Skin is warm and dry.  Neurological:     Mental Status: She is alert.     (all labs ordered are listed, but only abnormal results are displayed) Labs Reviewed  COMPREHENSIVE METABOLIC PANEL WITH GFR - Abnormal; Notable for the following components:      Result Value   Sodium 134 (*)    Glucose, Bld 153 (*)    AST 963 (*)    ALT 681 (*)    All other components within normal limits  CBC - Abnormal; Notable for the following components:   RBC 3.86 (*)    Hemoglobin 11.2 (*)    HCT 34.5 (*)  All other components within normal limits  LIPASE, BLOOD  URINALYSIS, ROUTINE W REFLEX MICROSCOPIC  PROTIME-INR  HEPATITIS PANEL, ACUTE  POC URINE PREG, ED    EKG: None  Radiology: US  Abdomen Limited RUQ (LIVER/GB) Result Date: 07/21/2023 CLINICAL DATA:  Right upper quadrant pain for 12 hours EXAM: ULTRASOUND ABDOMEN LIMITED RIGHT UPPER QUADRANT COMPARISON:  Ultrasound 10/28/2020 FINDINGS: Gallbladder: Dilated gallbladder. Multiple shadowing stones. Stones also towards the neck. Slight gallbladder wall thickening of 4 mm. No adjacent fluid. The sonographer does report pain when scanning the gallbladder. Common bile duct: Diameter: Enlarged at 10 mm. However previously was measured at 9 mm in 2022. some ectasia of the intrahepatic biliary tree Liver: No focal lesion identified. Within normal limits in parenchymal  echogenicity. Portal vein is patent on color Doppler imaging with normal direction of blood flow towards the liver. Other: Some limitation with overlapping bowel gas and soft tissue. IMPRESSION: Dilated gallbladder with stones including a stone towards the neck. There is also a reported Murphy's sign and mild gallbladder wall thickening. Please correlate with clinical findings of acute cholecystitis and if needed confirmatory HIDA scan when clinically appropriate. Ectatic common duct but was seen previously. Please correlate for known history otherwise confirmatory study could be performed as well when appropriate. Electronically Signed   By: Ranell Bring M.D.   On: 07/21/2023 09:55     Procedures   Medications Ordered in the ED  fentaNYL  (SUBLIMAZE ) injection 50 mcg (50 mcg Intravenous Given 07/21/23 0920)  ondansetron  (ZOFRAN ) injection 4 mg (4 mg Intravenous Given 07/21/23 0919)  lactated ringers  bolus 1,000 mL (1,000 mLs Intravenous New Bag/Given 07/21/23 0919)                                    Medical Decision Making Amount and/or Complexity of Data Reviewed Labs: ordered.    Details: Abnormal AST and ALT Radiology: ordered and independent interpretation performed.    Details: Gallstone  Risk Prescription drug management. Decision regarding hospitalization.   Patient symptoms appear to be coming from an impacted gallstone.  Doubt choledocholithiasis given normal bilirubin.  However her AST and ALT are abnormal, probably from the stone though this would be a little atypical with a normal alk phos.  No other red flags such as high Tylenol  use or alcohol abuse.  Pain is better with fentanyl  but still present.  Discussed with general surgery, Dr. Evonnie, who will admit for cholecystectomy.     Final diagnoses:  Calculus of gallbladder without cholecystitis without obstruction    ED Discharge Orders     None          Freddi Hamilton, MD 07/21/23 1024

## 2023-07-21 NOTE — Plan of Care (Signed)

## 2023-07-22 LAB — CBC
HCT: 31.8 % — ABNORMAL LOW (ref 36.0–46.0)
Hemoglobin: 10.4 g/dL — ABNORMAL LOW (ref 12.0–15.0)
MCH: 28.9 pg (ref 26.0–34.0)
MCHC: 32.7 g/dL (ref 30.0–36.0)
MCV: 88.3 fL (ref 80.0–100.0)
Platelets: 359 K/uL (ref 150–400)
RBC: 3.6 MIL/uL — ABNORMAL LOW (ref 3.87–5.11)
RDW: 13.5 % (ref 11.5–15.5)
WBC: 12.1 K/uL — ABNORMAL HIGH (ref 4.0–10.5)
nRBC: 0 % (ref 0.0–0.2)

## 2023-07-22 LAB — COMPREHENSIVE METABOLIC PANEL WITH GFR
ALT: 753 U/L — ABNORMAL HIGH (ref 0–44)
AST: 479 U/L — ABNORMAL HIGH (ref 15–41)
Albumin: 3.2 g/dL — ABNORMAL LOW (ref 3.5–5.0)
Alkaline Phosphatase: 115 U/L (ref 38–126)
Anion gap: 8 (ref 5–15)
BUN: 9 mg/dL (ref 6–20)
CO2: 23 mmol/L (ref 22–32)
Calcium: 9 mg/dL (ref 8.9–10.3)
Chloride: 104 mmol/L (ref 98–111)
Creatinine, Ser: 0.72 mg/dL (ref 0.44–1.00)
GFR, Estimated: >60 mL/min (ref >60)
Glucose, Bld: 182 mg/dL — ABNORMAL HIGH (ref 70–99)
Potassium: 3.7 mmol/L (ref 3.5–5.1)
Sodium: 135 mmol/L (ref 135–145)
Total Bilirubin: 2.2 mg/dL — ABNORMAL HIGH (ref 0.0–1.2)
Total Protein: 6.9 g/dL (ref 6.5–8.1)

## 2023-07-22 LAB — HIV ANTIBODY (ROUTINE TESTING W REFLEX): HIV Screen 4th Generation wRfx: NONREACTIVE

## 2023-07-22 MED ORDER — AMOXICILLIN-POT CLAVULANATE 500-125 MG PO TABS: 1.0000 | ORAL_TABLET | Freq: Three times a day (TID) | ORAL | 0 refills | Status: AC

## 2023-07-22 MED ORDER — DOCUSATE SODIUM 100 MG PO CAPS
100.0000 mg | ORAL_CAPSULE | Freq: Two times a day (BID) | ORAL | 2 refills | Status: AC
Start: 2023-07-22 — End: 2024-07-21

## 2023-07-22 MED ORDER — ACETAMINOPHEN 500 MG PO TABS: 1000.0000 mg | ORAL_TABLET | Freq: Four times a day (QID) | ORAL | 0 refills | Status: AC

## 2023-07-22 MED ORDER — OXYCODONE HCL 5 MG PO TABS: 5.0000 mg | ORAL_TABLET | Freq: Four times a day (QID) | ORAL | 0 refills | Status: DC | PRN

## 2023-07-22 MED ORDER — ONDANSETRON HCL 4 MG PO TABS: 4.0000 mg | ORAL_TABLET | Freq: Every day | ORAL | 1 refills | Status: DC | PRN

## 2023-07-22 NOTE — Plan of Care (Signed)

## 2023-07-22 NOTE — Progress Notes (Addendum)
 I was present with the medical student for this service. I personally verified the history of present illness, performed the physical exam, and made the plan for this encounter. I have verified the medical student's documentation and made modifications where appropriately. I have personally documented in my own words a brief history, physical, and plan below.     Please see discharge summary.  Katie Brittle, DO [facility] [address] [place], [place] [address] [phone] (office)   1 Day Post-Op  Subjective: Patient reports feeling generally well this morning and at rest has 0/10 abdominal pain postop day 1. She does not believe she has passed flatus yet and has not felt the urge for a bowel movement. She slept well and reports no overnight events. She does endorse some swelling on the dorsal aspect of her right hand which she says developed following insertion of an IV in the hand. It is not painful for her. She is ambulating with assistance. She denies any fevers, chills, nausea, or vomiting.   Objective: Vital signs in last 24 hours: Temp:  [97.6 F (36.4 C)-98.8 F (37.1 C)] 98.8 F (37.1 C) (07/16 0413) Pulse Rate:  [76-98] 98 (07/16 0413) Resp:  [16-22] 16 (07/16 0413) BP: (124-180)/(78-104) 150/85 (07/16 0413) SpO2:  [94 %-100 %] 96 % (07/16 0413) Last BM Date : 07/20/23  Intake/Output from previous day: 07/15 0701 - 07/16 0700 In: 1740.2 [P.O.:240; I.V.:1398.2; IV Piggyback:102] Out: 5 [Blood:5] Intake/Output this shift: No intake/output data recorded.  General appearance: alert and no distress Head: Normocephalic, without obvious abnormality, atraumatic Resp: clear to auscultation bilaterally GI: normal findings: bowel sounds normal, no bruits heard, and no distention. Does endorse tenderness at her left lateral port incision on palpation Extremities: edema present in the right hand but none in either lower extremity. Right  hand is not erythematous or tender. Pulses: 2+ and symmetric Skin: Skin color, texture, turgor normal. No rashes or lesions Incision/Wound: No erythema or drainage at any of her port incisions.   Lab Results:  Recent Labs    07/21/23 0741 07/22/23 0449  WBC 8.7 12.1*  HGB 11.2* 10.4*  HCT 34.5* 31.8*  PLT 373 359   BMET Recent Labs    07/21/23 0741 07/22/23 0449  NA 134* 135  K 3.8 3.7  CL 100 104  CO2 22 23  GLUCOSE 153* 182*  BUN 7 9  CREATININE 0.65 0.72  CALCIUM 9.1 9.0   PT/INR Recent Labs    07/21/23 1001  LABPROT 13.7  INR 1.0    Studies/Results: US  Abdomen Limited RUQ (LIVER/GB) Result Date: 07/21/2023 CLINICAL DATA:  Right upper quadrant pain for 12 hours EXAM: ULTRASOUND ABDOMEN LIMITED RIGHT UPPER QUADRANT COMPARISON:  Ultrasound 10/28/2020 FINDINGS: Gallbladder: Dilated gallbladder. Multiple shadowing stones. Stones also towards the neck. Slight gallbladder wall thickening of 4 mm. No adjacent fluid. The sonographer does report pain when scanning the gallbladder. Common bile duct: Diameter: Enlarged at 10 mm. However previously was measured at 9 mm in 2022. some ectasia of the intrahepatic biliary tree Liver: No focal lesion identified. Within normal limits in parenchymal echogenicity. Portal vein is patent on color Doppler imaging with normal direction of blood flow towards the liver. Other: Some limitation with overlapping bowel gas and soft tissue. IMPRESSION: Dilated gallbladder with stones including a stone towards the neck. There is also a reported Murphy's sign and mild gallbladder wall thickening. Please correlate with clinical findings of acute cholecystitis and if needed confirmatory HIDA scan when clinically  appropriate. Ectatic common duct but was seen previously. Please correlate for known history otherwise confirmatory study could be performed as well when appropriate. Electronically Signed   By: Ranell Bring M.D.   On: 07/21/2023 09:55     Anti-infectives: Anti-infectives (From admission, onward)    Start     Dose/Rate Route Frequency Ordered Stop   07/21/23 1800  cefTRIAXone  (ROCEPHIN ) 2 g in sodium chloride  0.9 % 100 mL IVPB        2 g 200 mL/hr over 30 Minutes Intravenous Every 24 hours 07/21/23 1610 07/28/23 1759   07/21/23 1330  cefoTEtan  (CEFOTAN ) 2 g in sodium chloride  0.9 % 100 mL IVPB        2 g 200 mL/hr over 30 Minutes Intravenous On call to O.R. 07/21/23 1220 07/21/23 1612   07/21/23 1234  sodium chloride  0.9 % with cefoTEtan  (CEFOTAN ) ADS Med       Note to Pharmacy: Dario Stabs S: cabinet override      07/21/23 1234 07/21/23 1327       Assessment/Plan: s/p Procedure(s): CHOLECYSTECTOMY, ROBOT-ASSISTED, LAPAROSCOPIC Impression: Doing well s/p above. Patient does still have slightly elevated bilirubin which could be reactive to surgical intervention. Patient is not in any pain and has no RUQ tenderness. Will plan to obtain repeat LFTs on an outpatient basis. Continue to normal diet as tolerated, continue ambulation. Plan for discharge later today.  Discharge  LOS: 0 days    Katie Curtis 07/22/2023

## 2023-07-22 NOTE — Discharge Summary (Signed)
 Physician Discharge Summary  Patient ID: Katie Curtis MRN: [id number] DOB/AGE: 1980-05-22 43 y.o.  Admit date: 07/21/2023 Discharge date: 07/22/2023  Admission Diagnoses: Acute cholecystitis  Discharge Diagnoses:  Principal Problem:   Cholelithiasis Acute cholecystitis  Discharged Condition: stable  Hospital Course: Patient is a 43 year old female who was admitted with cholelithiasis and concern for biliary colic.  She underwent a robotic assisted laparoscopic cholecystectomy on 7/15, at which time she was noted to have acute cholecystitis with empyema of the gallbladder.  She was admitted postoperatively, and was able to tolerate a diet without nausea and vomiting.  Her pain was controlled with oral medications.  She had mild increase in her leukocytosis which was thought to be secondary to surgery.  She had a mild bump in her total bilirubin and ALT with normal alkaline phosphatase and decrease in her AST.  I discussed with the patient that we will plan to get an outpatient CMP tomorrow to evaluate her LFTs.  We discussed that she could develop choledocholithiasis if a stone was pushed into her common bile duct during the surgery.  Will hold off on MRCP at this time, as her LFT elevations could be secondary to the surgery.  She is stable for discharge home at this time.  She will be sent home with prescriptions for Augmentin  for 4 days, Zofran , oxycodone , Tylenol , and Colace.  I will call her with the results of her LFTs tomorrow.  She will follow-up with me in 2 weeks.  Consults: None  Significant Diagnostic Studies: labs: AST and ALT elevation preoperatively and radiology: Ultrasound: Distended gallbladder with wall thickening, equivocal for acute cholecystitis  Treatments: IV hydration, antibiotics: ceftriaxone , analgesia: acetaminophen , Morphine , and oxycodone , and surgery: Robotic assisted laparoscopic cholecystectomy on 7/15  Discharge Exam: Blood pressure (!) 150/85, pulse 98,  temperature 98.8 F (37.1 C), temperature source Oral, resp. rate 16, height 5' 4 (1.626 m), weight 93 kg, last menstrual period 07/16/2023, SpO2 96%. General appearance: alert, cooperative, and no distress GI: Abdomen soft, nondistended, no percussion tenderness, mild incisional tenderness to palpation; no rigidity, guarding, rebound tenderness; laparoscopic incision sites C/D/I with skin glue in place  Disposition: Discharge disposition: 01-Home or Self Care       Discharge Instructions     Call MD for:  persistant nausea and vomiting   Complete by: As directed    Call MD for:  redness, tenderness, or signs of infection (pain, swelling, redness, odor or green/yellow discharge around incision site)   Complete by: As directed    Call MD for:  severe uncontrolled pain   Complete by: As directed    Call MD for:  temperature >100.4   Complete by: As directed    Diet - low sodium heart healthy   Complete by: As directed    Increase activity slowly   Complete by: As directed       Allergies as of 07/22/2023       Reactions   Hydrocodone    Oxycodone     Penicillins Nausea And Vomiting        Medication List     TAKE these medications    acetaminophen  500 MG tablet Commonly known as: TYLENOL  Take 2 tablets (1,000 mg total) by mouth every 6 (six) hours for 7 days. What changed:  when to take this reasons to take this   amoxicillin -clavulanate 500-125 MG tablet Commonly known as: Augmentin  Take 1 tablet by mouth 3 (three) times daily for 4 days.   BD Pen Needle Nano  2nd Gen 32G X 4 MM Misc Generic drug: Insulin  Pen Needle Use 1 needle daily to inject medication.   docusate sodium  100 MG capsule Commonly known as: Colace Take 1 capsule (100 mg total) by mouth 2 (two) times daily.   ibuprofen  800 MG tablet Commonly known as: ADVIL  Take 800 mg by mouth every 8 (eight) hours as needed for moderate pain (pain score 4-6).   ondansetron  4 MG tablet Commonly known  as: Zofran  Take 1 tablet (4 mg total) by mouth daily as needed for nausea or vomiting (May take as needed with antibiotic or pain medications).   oxyCODONE  5 MG immediate release tablet Commonly known as: Oxy IR/ROXICODONE  Take 1-2 tablets (5-10 mg total) by mouth every 6 (six) hours as needed for moderate pain (pain score 4-6).        Follow-up Information     Margret Moat, Dorothyann LABOR, DO. Call.   Specialty: General Surgery Why: Call to schedule a follow up appointment in 2 weeks Contact information: [address] [place] [facility] [address] [phone]                 Signed: Ocie Tino A Samaria Anes 07/22/2023, 10:30 AM

## 2023-07-22 NOTE — Progress Notes (Signed)
 Mobility Specialist Progress Note:    07/22/23 1106  Mobility  Activity Ambulated with assistance to bathroom;Ambulated with assistance in room  Level of Assistance Modified independent, requires aide device or extra time  Assistive Device None  Distance Ambulated (ft) 15 ft  Range of Motion/Exercises Active;All extremities  Activity Response Tolerated well  Mobility Referral Yes  Mobility visit 1 Mobility  Mobility Specialist Start Time (ACUTE ONLY) 1052  Mobility Specialist Stop Time (ACUTE ONLY) 1106  Mobility Specialist Time Calculation (min) (ACUTE ONLY) 14 min   Pt received requesting assistance. ModI to stand and ambulate with no AD. Tolerated well, asx throughout. All needs met.   Sherrilee Ditty Mobility Specialist Please contact via Special educational needs teacher or  Rehab office at [phone]

## 2023-07-23 ENCOUNTER — Other Ambulatory Visit: Payer: Self-pay | Admitting: Surgery

## 2023-07-23 ENCOUNTER — Encounter (HOSPITAL_COMMUNITY): Payer: Self-pay

## 2023-07-23 ENCOUNTER — Other Ambulatory Visit: Payer: Self-pay

## 2023-07-23 ENCOUNTER — Telehealth (INDEPENDENT_AMBULATORY_CARE_PROVIDER_SITE_OTHER): Payer: Self-pay | Admitting: Surgery

## 2023-07-23 ENCOUNTER — Observation Stay (HOSPITAL_COMMUNITY)
Admission: EM | Admit: 2023-07-23 | Discharge: 2023-07-24 | Disposition: A | Discharge status: Other Acute Inpatient Hospital | Source: Ambulatory Visit | Attending: Internal Medicine | Admitting: Internal Medicine

## 2023-07-23 ENCOUNTER — Other Ambulatory Visit (INDEPENDENT_AMBULATORY_CARE_PROVIDER_SITE_OTHER): Admitting: Surgery

## 2023-07-23 ENCOUNTER — Emergency Department (HOSPITAL_COMMUNITY)

## 2023-07-23 ENCOUNTER — Other Ambulatory Visit (HOSPITAL_COMMUNITY)
Admission: RE | Admit: 2023-07-23 | Discharge: 2023-07-23 | Disposition: A | Discharge status: Home / Self Care | Source: Ambulatory Visit | Attending: Surgery | Admitting: Surgery

## 2023-07-23 DIAGNOSIS — K8050 Calculus of bile duct without cholangitis or cholecystitis without obstruction: Principal | ICD-10-CM | POA: Insufficient documentation

## 2023-07-23 DIAGNOSIS — K8000 Calculus of gallbladder with acute cholecystitis without obstruction: Secondary | ICD-10-CM | POA: Insufficient documentation

## 2023-07-23 DIAGNOSIS — R109 Unspecified abdominal pain: Secondary | ICD-10-CM | POA: Diagnosis present

## 2023-07-23 LAB — COMPREHENSIVE METABOLIC PANEL WITH GFR
ALT: 562 U/L — ABNORMAL HIGH (ref 0–44)
ALT: 467 U/L — ABNORMAL HIGH (ref 0–44)
AST: 158 U/L — ABNORMAL HIGH (ref 15–41)
AST: 109 U/L — ABNORMAL HIGH (ref 15–41)
Albumin: 3.4 g/dL — ABNORMAL LOW (ref 3.5–5.0)
Albumin: 3.3 g/dL — ABNORMAL LOW (ref 3.5–5.0)
Alkaline Phosphatase: 128 U/L — ABNORMAL HIGH (ref 38–126)
Alkaline Phosphatase: 121 U/L (ref 38–126)
Anion gap: 9 (ref 5–15)
Anion gap: 12 (ref 5–15)
BUN: 9 mg/dL (ref 6–20)
BUN: 6 mg/dL (ref 6–20)
CO2: 24 mmol/L (ref 22–32)
CO2: 25 mmol/L (ref 22–32)
Calcium: 8.8 mg/dL — ABNORMAL LOW (ref 8.9–10.3)
Calcium: 8.6 mg/dL — ABNORMAL LOW (ref 8.9–10.3)
Chloride: 104 mmol/L (ref 98–111)
Chloride: 102 mmol/L (ref 98–111)
Creatinine, Ser: 0.66 mg/dL (ref 0.44–1.00)
Creatinine, Ser: 0.59 mg/dL (ref 0.44–1.00)
GFR, Estimated: >60 mL/min (ref >60)
GFR, Estimated: >60 mL/min (ref >60)
Glucose, Bld: 103 mg/dL — ABNORMAL HIGH (ref 70–99)
Glucose, Bld: 99 mg/dL (ref 70–99)
Potassium: 3.4 mmol/L — ABNORMAL LOW (ref 3.5–5.1)
Potassium: 3.3 mmol/L — ABNORMAL LOW (ref 3.5–5.1)
Sodium: 137 mmol/L (ref 135–145)
Sodium: 139 mmol/L (ref 135–145)
Total Bilirubin: 3.3 mg/dL — ABNORMAL HIGH (ref 0.0–1.2)
Total Bilirubin: 3.2 mg/dL — ABNORMAL HIGH (ref 0.0–1.2)
Total Protein: 7.2 g/dL (ref 6.5–8.1)
Total Protein: 7 g/dL (ref 6.5–8.1)

## 2023-07-23 LAB — CBC WITH DIFFERENTIAL/PLATELET
Abs Immature Granulocytes: 0.05 K/uL (ref 0.00–0.07)
Basophils Absolute: 0 K/uL (ref 0.0–0.1)
Basophils Relative: 0 %
Eosinophils Absolute: 0.1 K/uL (ref 0.0–0.5)
Eosinophils Relative: 1 %
HCT: 32.7 % — ABNORMAL LOW (ref 36.0–46.0)
Hemoglobin: 10.9 g/dL — ABNORMAL LOW (ref 12.0–15.0)
Immature Granulocytes: 0 %
Lymphocytes Relative: 14 %
Lymphs Abs: 1.5 K/uL (ref 0.7–4.0)
MCH: 29.5 pg (ref 26.0–34.0)
MCHC: 33.3 g/dL (ref 30.0–36.0)
MCV: 88.6 fL (ref 80.0–100.0)
Monocytes Absolute: 0.9 K/uL (ref 0.1–1.0)
Monocytes Relative: 8 %
Neutro Abs: 8.8 K/uL — ABNORMAL HIGH (ref 1.7–7.7)
Neutrophils Relative %: 77 %
Platelets: 370 K/uL (ref 150–400)
RBC: 3.69 MIL/uL — ABNORMAL LOW (ref 3.87–5.11)
RDW: 14.2 % (ref 11.5–15.5)
WBC: 11.4 K/uL — ABNORMAL HIGH (ref 4.0–10.5)
nRBC: 0 % (ref 0.0–0.2)

## 2023-07-23 LAB — LIPASE, BLOOD: Lipase: 25 U/L (ref 11–51)

## 2023-07-23 LAB — SURGICAL PATHOLOGY

## 2023-07-23 MED ORDER — ONDANSETRON HCL 4 MG/2ML IJ SOLN
4.0000 mg | Freq: Three times a day (TID) | INTRAMUSCULAR | Status: DC | PRN
  Administered 2023-07-23: 4 mg via INTRAVENOUS
  Filled 2023-07-23: qty 2

## 2023-07-23 MED ORDER — MORPHINE SULFATE (PF) 2 MG/ML IV SOLN
2.0000 mg | INTRAVENOUS | Status: DC | PRN
  Administered 2023-07-23 – 2023-07-24 (×2): 2 mg via INTRAVENOUS
  Filled 2023-07-23 (×2): qty 1

## 2023-07-23 MED ORDER — IBUPROFEN 200 MG PO TABS: 800.0000 mg | ORAL_TABLET | Freq: Four times a day (QID) | ORAL | Status: DC | PRN

## 2023-07-23 MED ORDER — GADOBUTROL 1 MMOL/ML IV SOLN
9.0000 mL | Freq: Once | INTRAVENOUS | Status: AC | PRN
  Administered 2023-07-23: 9 mL via INTRAVENOUS

## 2023-07-23 MED ORDER — IBUPROFEN 800 MG PO TABS: 800.0000 mg | ORAL_TABLET | Freq: Three times a day (TID) | ORAL | 0 refills | Status: AC | PRN

## 2023-07-23 NOTE — Telephone Encounter (Signed)
 [facility]  10:15 AM- Called and spoke with the patient this morning to see how she was doing since discharge from the hospital.  Stated that she had increased abdominal pain overnight, but that her pain had improved this morning.  She is headed over to the hospital to get her blood work.  11:30 AM- Spoke to the patient again after her CMP had resulted.  We discussed that the bilirubin was continuing to elevate, and that I recommended she present to the ED for MRCP to evaluate for choledocholithiasis.  I subsequently spoke with Dr. Cleotilde advising him that the patient would present to the ED and require an MRCP.  4:00 PM- MRCP resulted and demonstrated choledocholithiasis.  I again called the patient to update her regarding this finding.  We discussed that she would need to undergo ERCP to remove this stone.  All questions were answered to her expressed satisfaction.  She will still follow up with me in 2 weeks for follow up after surgery.  I subsequently spoke with Dr. Jinny who is agreeable to perform the patient's ERCP.  Discussed with Dr. Cleotilde that the patient will be admitted to hospitalist service and transferred to [facility] for ERCP.  Transfer to [facility] has been initiated.  [name], DO [facility] [address] [place], [place] [address] [phone] (office)

## 2023-07-23 NOTE — ED Provider Notes (Signed)
   Patient is pending transfer to [facility] for ERCP Dr Jinny, pt is admitted to [facility] Dr Celinda Sermon w/ Dr Jinny, anesthesia only available until 11-12pm tomorrow, pt does not have bed assigned. If she cannot get to [facility] by the morning will be unable to do procedure. Dr Viviann at [facility] accepts pt ER to ER to ensure pt is able to get procedure in the AM Will transfer ER to ER    Elnor Jayson LABOR, DO 07/23/23 2349

## 2023-07-23 NOTE — ED Triage Notes (Signed)
 Pt arrived via POV following conversation with her doctor who is concerned for abnormal lab work, and is requesting an MRI.

## 2023-07-23 NOTE — ED Notes (Signed)
 Pt ambulated to BR with steady gait.

## 2023-07-23 NOTE — ED Provider Notes (Addendum)
 [place] EMERGENCY DEPARTMENT AT [facility] Provider Note   CSN: [id number] Arrival date & time: 07/23/23  1301     Patient presents with: Abdominal Pain   Katie Curtis is a 43 y.o. female.    Abdominal Pain    This patient is a 43 year old female who recently had a cholecystectomy, she has been followed by her general surgeon as an outpatient because of ongoing pain and was found to have an uptrending bilirubin.  The patient has ongoing mild mid abdominal tenderness, no vomiting, no nausea, no diarrhea  She was sent here by her general surgeon for an MRCP  Prior to Admission medications   Medication Sig Start Date End Date Taking? Authorizing Provider  ibuprofen  (ADVIL ) 800 MG tablet Take 1 tablet (800 mg total) by mouth every 8 (eight) hours as needed. 07/23/23   Pappayliou, Dorothyann A, DO  acetaminophen  (TYLENOL ) 500 MG tablet Take 2 tablets (1,000 mg total) by mouth every 6 (six) hours for 7 days. 07/22/23 07/29/23  Pappayliou, Dorothyann A, DO  amoxicillin -clavulanate (AUGMENTIN ) 500-125 MG tablet Take 1 tablet by mouth 3 (three) times daily for 4 days. 07/22/23 07/26/23  Pappayliou, Dorothyann LABOR, DO  docusate sodium  (COLACE) 100 MG capsule Take 1 capsule (100 mg total) by mouth 2 (two) times daily. 07/22/23 07/21/24  Pappayliou, Dorothyann A, DO  Insulin  Pen Needle (BD PEN NEEDLE NANO 2ND GEN) 32G X 4 MM MISC Use 1 needle daily to inject medication. 10/02/21   Whitmire, Dawn, FNP  ondansetron  (ZOFRAN ) 4 MG tablet Take 1 tablet (4 mg total) by mouth daily as needed for nausea or vomiting (May take as needed with antibiotic or pain medications). 07/22/23 07/21/24  Pappayliou, Dorothyann A, DO  oxyCODONE  (OXY IR/ROXICODONE ) 5 MG immediate release tablet Take 1-2 tablets (5-10 mg total) by mouth every 6 (six) hours as needed for moderate pain (pain score 4-6). 07/22/23   Pappayliou, Dorothyann A, DO    Allergies: Hydrocodone, Oxycodone , and Penicillins    Review of Systems   Gastrointestinal:  Positive for abdominal pain.  All other systems reviewed and are negative.   Updated Vital Signs BP (!) 160/99   Pulse 83   Temp 97.9 F (36.6 C)   Resp 19   Ht 1.626 m (5' 4)   Wt 93 kg   LMP 07/16/2023 (Approximate) Comment: Urine Pregnancy Test Negative as of 07/21/23.  SpO2 100%   BMI 35.19 kg/m   Physical Exam Vitals and nursing note reviewed.  Constitutional:      General: She is not in acute distress.    Appearance: She is well-developed.  HENT:     Head: Normocephalic and atraumatic.     Mouth/Throat:     Pharynx: No oropharyngeal exudate.  Eyes:     General: No scleral icterus.       Right eye: No discharge.        Left eye: No discharge.     Conjunctiva/sclera: Conjunctivae normal.     Pupils: Pupils are equal, round, and reactive to light.  Neck:     Thyroid: No thyromegaly.     Vascular: No JVD.  Cardiovascular:     Rate and Rhythm: Normal rate and regular rhythm.     Heart sounds: Normal heart sounds. No murmur heard.    No friction rub. No gallop.  Pulmonary:     Effort: Pulmonary effort is normal. No respiratory distress.     Breath sounds: Normal breath sounds. No wheezing or rales.  Abdominal:     General: Bowel sounds are normal. There is no distension.     Palpations: Abdomen is soft. There is no mass.     Tenderness: There is abdominal tenderness.     Comments: Mild mid abdominal tenderness without guarding  Musculoskeletal:        General: No tenderness. Normal range of motion.     Cervical back: Normal range of motion and neck supple.     Right lower leg: No edema.     Left lower leg: No edema.  Lymphadenopathy:     Cervical: No cervical adenopathy.  Skin:    General: Skin is warm and dry.     Findings: No erythema or rash.  Neurological:     Mental Status: She is alert.     Coordination: Coordination normal.  Psychiatric:        Behavior: Behavior normal.     (all labs ordered are listed, but only abnormal  results are displayed) Labs Reviewed  CBC WITH DIFFERENTIAL/PLATELET - Abnormal; Notable for the following components:      Result Value   WBC 11.4 (*)    RBC 3.69 (*)    Hemoglobin 10.9 (*)    HCT 32.7 (*)    Neutro Abs 8.8 (*)    All other components within normal limits  LIPASE, BLOOD    EKG: None  Radiology: MR ABDOMEN MRCP W WO CONTAST Result Date: 07/23/2023 CLINICAL DATA:  Biliary obstruction suspected, status post cholecystectomy with continued pain EXAM: MRI ABDOMEN WITHOUT AND WITH CONTRAST (INCLUDING MRCP) TECHNIQUE: Multiplanar multisequence MR imaging of the abdomen was performed both before and after the administration of intravenous contrast. Heavily T2-weighted images of the biliary and pancreatic ducts were obtained, and three-dimensional MRCP images were rendered by post processing. CONTRAST:  9mL GADAVIST  GADOBUTROL  1 MMOL/ML IV SOLN COMPARISON:  Right upper quadrant ultrasound, 07/21/2023 FINDINGS: Lower chest: No acute abnormality. Hepatobiliary: No solid liver abnormality is seen. Status post cholecystectomy. Intra and extrahepatic biliary ductal dilatation, common bile duct measuring up to 1.3 cm in caliber, with a 0.9 cm calculus near the ampulla (series 5, image 12). Pancreas: Unremarkable. No pancreatic ductal dilatation or surrounding inflammatory changes. Spleen: Normal in size without significant abnormality. Adrenals/Urinary Tract: Adrenal glands are unremarkable. Kidneys are normal, without renal calculi, solid lesion, or hydronephrosis. Bladder is unremarkable. Stomach/Bowel: Stomach is within normal limits. Appendix appears normal. No evidence of bowel wall thickening, distention, or inflammatory changes. Vascular/Lymphatic: No significant vascular findings are present. No enlarged abdominal or pelvic lymph nodes. Reproductive: No mass or other significant abnormality. Other: No abdominal wall hernia or abnormality. No ascites. Musculoskeletal: No acute or  significant osseous findings. IMPRESSION: 1. Choledocholithiasis. Intra and extrahepatic biliary ductal dilatation, common bile duct measuring up to 1.3 cm in caliber, with a 0.9 cm calculus near the ampulla. 2. Status post cholecystectomy. Electronically Signed   By: Marolyn JONETTA Jaksch M.D.   On: 07/23/2023 15:06   MR 3D Recon At Scanner Result Date: 07/23/2023 CLINICAL DATA:  Biliary obstruction suspected, status post cholecystectomy with continued pain EXAM: MRI ABDOMEN WITHOUT AND WITH CONTRAST (INCLUDING MRCP) TECHNIQUE: Multiplanar multisequence MR imaging of the abdomen was performed both before and after the administration of intravenous contrast. Heavily T2-weighted images of the biliary and pancreatic ducts were obtained, and three-dimensional MRCP images were rendered by post processing. CONTRAST:  9mL GADAVIST  GADOBUTROL  1 MMOL/ML IV SOLN COMPARISON:  Right upper quadrant ultrasound, 07/21/2023 FINDINGS: Lower chest: No acute abnormality. Hepatobiliary: No solid  liver abnormality is seen. Status post cholecystectomy. Intra and extrahepatic biliary ductal dilatation, common bile duct measuring up to 1.3 cm in caliber, with a 0.9 cm calculus near the ampulla (series 5, image 12). Pancreas: Unremarkable. No pancreatic ductal dilatation or surrounding inflammatory changes. Spleen: Normal in size without significant abnormality. Adrenals/Urinary Tract: Adrenal glands are unremarkable. Kidneys are normal, without renal calculi, solid lesion, or hydronephrosis. Bladder is unremarkable. Stomach/Bowel: Stomach is within normal limits. Appendix appears normal. No evidence of bowel wall thickening, distention, or inflammatory changes. Vascular/Lymphatic: No significant vascular findings are present. No enlarged abdominal or pelvic lymph nodes. Reproductive: No mass or other significant abnormality. Other: No abdominal wall hernia or abnormality. No ascites. Musculoskeletal: No acute or significant osseous findings.  IMPRESSION: 1. Choledocholithiasis. Intra and extrahepatic biliary ductal dilatation, common bile duct measuring up to 1.3 cm in caliber, with a 0.9 cm calculus near the ampulla. 2. Status post cholecystectomy. Electronically Signed   By: Marolyn JONETTA Jaksch M.D.   On: 07/23/2023 15:06     Procedures   Medications Ordered in the ED  gadobutrol  (GADAVIST ) 1 MMOL/ML injection 9 mL (9 mLs Intravenous Contrast Given 07/23/23 1428)                                    Medical Decision Making Amount and/or Complexity of Data Reviewed Labs: ordered. Radiology: ordered.  Risk Prescription drug management.    This patient presents to the ED for concern of abdominal pain postop differential diagnosis includes obstructed biliary tree, postoperative abnormality such as infection or abscess or fluid collection    Additional history obtained   Additional history obtained from Electronic Medical Record External records from outside source obtained and reviewed including prior surgical notes   Lab Tests:  I Ordered, and personally interpreted labs.  The pertinent results include: Reviewed labs from yesterday showing elevated bilirubin just over 2   Imaging Studies ordered:  I ordered imaging studies including MRCP Pending at the time of change of shift   Problem List / ED Course:  MRCP pending, will discuss with general surgeon once results are back   Social Determinants of Health:  Recent surgery  I discussed the case again with general surgery, the scan does show that the patient likely has an obstructive biliary tree with choledocholithiasis, they have already discussed the case with the gastroenterologist Dr. Jinny, he has accepted the patient from a procedural standpoint, will work on hospitalist acceptance at [facility]  I discussed the case with Dr. Celinda of the hospitalist service at [facility] who is excepted the patient in transfer     Final diagnoses:   Choledocholithiasis    ED Discharge Orders     None          Cleotilde Rogue, MD 07/23/23 1442    Cleotilde Rogue, MD 07/23/23 [phone]

## 2023-07-23 NOTE — Progress Notes (Signed)
 Plan of Care Note for accepted transfer   Patient: Katie Curtis MRN: [id number]   DOA: 07/23/2023  Facility requesting transfer: [facility] ED. Requesting Provider: Redell Pinal, MD. Reason for transfer: Hyperbilirubinemia Facility course:  43 year old female with a history of type 2 diabetes, class II obesity, hyperlipidemia, vitamin D  deficiency, chronic cholecystitis who underwent a cholecystectomy recently who presented to the emergency department with abnormal LFTs.  Lab work: CMP showed potassium of 3.4 mmol/L, but the rest of the electrolytes are normal after calcium correction.  Glucose 103 and total bilirubin 3.3 mg/dL.  Alkaline phosphatase 128, AST 858 and ALT 562 units/L.  Lipase was normal.  CBC showed white count 11.4, Hemann 10.9 g/dL platelets 629.  Imaging: MRCP showed choledocholithiasis with CBD measuring up to 1.3 cm in caliber, with a 0.9 cm calculus near the ampulla.  Plan of care: The patient is accepted for admission to Med-surg  unit, at [facility] as [facility] does not have ERCP availability at the moment.  Dr. Ricarda from gastroenterology has accepted the patient to [facility] for ERCP.  Author: Alm Dorn Castor, MD 07/23/2023  Check [email or web address] for on-call coverage.  Nursing staff, Please call [facility] Admits & Consults System-Wide number on Amion as soon as patient's arrival, so appropriate admitting provider can evaluate the pt.

## 2023-07-23 NOTE — ED Notes (Signed)
 Dr. Cleotilde at bedside updating patient on plan of care and test results.

## 2023-07-23 NOTE — ED Notes (Signed)
 Pt is requesting to speak with EDP regarding transfer process, timeline for transfer, pain medication, and when she can eat/drink. Secure chat sent to MD regarding same.

## 2023-07-24 ENCOUNTER — Encounter
Admission: RE | Disposition: A | Discharge status: Home / Self Care | Payer: Self-pay | Source: Other Acute Inpatient Hospital | Attending: Internal Medicine

## 2023-07-24 ENCOUNTER — Observation Stay
Admission: RE | Admit: 2023-07-24 | Discharge: 2023-07-25 | Disposition: A | Discharge status: Home / Self Care | Source: Other Acute Inpatient Hospital | Attending: Internal Medicine | Admitting: Internal Medicine

## 2023-07-24 ENCOUNTER — Observation Stay

## 2023-07-24 ENCOUNTER — Observation Stay: Admitting: Certified Registered Nurse Anesthetist

## 2023-07-24 ENCOUNTER — Inpatient Hospital Stay: Admit: 2023-07-24 | Payer: Self-pay | Admitting: Psychiatry

## 2023-07-24 DIAGNOSIS — E669 Obesity, unspecified: Secondary | ICD-10-CM | POA: Insufficient documentation

## 2023-07-24 DIAGNOSIS — Z794 Long term (current) use of insulin: Secondary | ICD-10-CM | POA: Insufficient documentation

## 2023-07-24 DIAGNOSIS — Z79899 Other long term (current) drug therapy: Secondary | ICD-10-CM | POA: Diagnosis not present

## 2023-07-24 DIAGNOSIS — I10 Essential (primary) hypertension: Secondary | ICD-10-CM | POA: Insufficient documentation

## 2023-07-24 DIAGNOSIS — K8050 Calculus of bile duct without cholangitis or cholecystitis without obstruction: Secondary | ICD-10-CM | POA: Diagnosis present

## 2023-07-24 DIAGNOSIS — Z743 Need for continuous supervision: Secondary | ICD-10-CM | POA: Diagnosis not present

## 2023-07-24 DIAGNOSIS — E119 Type 2 diabetes mellitus without complications: Secondary | ICD-10-CM | POA: Diagnosis not present

## 2023-07-24 DIAGNOSIS — R7889 Finding of other specified substances, not normally found in blood: Secondary | ICD-10-CM | POA: Diagnosis not present

## 2023-07-24 DIAGNOSIS — E7800 Pure hypercholesterolemia, unspecified: Secondary | ICD-10-CM | POA: Diagnosis not present

## 2023-07-24 DIAGNOSIS — R7303 Prediabetes: Secondary | ICD-10-CM | POA: Diagnosis present

## 2023-07-24 DIAGNOSIS — Z6835 Body mass index (BMI) 35.0-35.9, adult: Secondary | ICD-10-CM | POA: Insufficient documentation

## 2023-07-24 HISTORY — PX: ERCP: SHX5425

## 2023-07-24 LAB — COMPREHENSIVE METABOLIC PANEL WITH GFR
ALT: 382 U/L — ABNORMAL HIGH (ref 0–44)
AST: 83 U/L — ABNORMAL HIGH (ref 15–41)
Albumin: 3.3 g/dL — ABNORMAL LOW (ref 3.5–5.0)
Alkaline Phosphatase: 122 U/L (ref 38–126)
Anion gap: 8 (ref 5–15)
BUN: 7 mg/dL (ref 6–20)
CO2: 26 mmol/L (ref 22–32)
Calcium: 9 mg/dL (ref 8.9–10.3)
Chloride: 103 mmol/L (ref 98–111)
Creatinine, Ser: 0.42 mg/dL — ABNORMAL LOW (ref 0.44–1.00)
GFR, Estimated: >60 mL/min (ref >60)
Glucose, Bld: 115 mg/dL — ABNORMAL HIGH (ref 70–99)
Potassium: 3.2 mmol/L — ABNORMAL LOW (ref 3.5–5.1)
Sodium: 137 mmol/L (ref 135–145)
Total Bilirubin: 3.5 mg/dL — ABNORMAL HIGH (ref 0.0–1.2)
Total Protein: 6.9 g/dL (ref 6.5–8.1)

## 2023-07-24 LAB — CBC
HCT: 31.1 % — ABNORMAL LOW (ref 36.0–46.0)
Hemoglobin: 10.1 g/dL — ABNORMAL LOW (ref 12.0–15.0)
MCH: 28.5 pg (ref 26.0–34.0)
MCHC: 32.5 g/dL (ref 30.0–36.0)
MCV: 87.9 fL (ref 80.0–100.0)
Platelets: 336 K/uL (ref 150–400)
RBC: 3.54 MIL/uL — ABNORMAL LOW (ref 3.87–5.11)
RDW: 13.9 % (ref 11.5–15.5)
WBC: 11.5 K/uL — ABNORMAL HIGH (ref 4.0–10.5)
nRBC: 0 % (ref 0.0–0.2)

## 2023-07-24 LAB — GLUCOSE, CAPILLARY
Glucose-Capillary: 150 mg/dL — ABNORMAL HIGH (ref 70–99)
Glucose-Capillary: 94 mg/dL (ref 70–99)
Glucose-Capillary: 95 mg/dL (ref 70–99)
Glucose-Capillary: 96 mg/dL (ref 70–99)
Glucose-Capillary: 99 mg/dL (ref 70–99)

## 2023-07-24 LAB — HEMOGLOBIN A1C
Hgb A1c MFr Bld: 5.2 % (ref 4.8–5.6)
Mean Plasma Glucose: 102.54 mg/dL

## 2023-07-24 SURGERY — ERCP, WITH INTERVENTION IF INDICATED
Anesthesia: General

## 2023-07-24 MED ORDER — FENTANYL CITRATE (PF) 100 MCG/2ML IJ SOLN
INTRAMUSCULAR | Status: AC
  Filled 2023-07-24: qty 2

## 2023-07-24 MED ORDER — MIDAZOLAM HCL 2 MG/2ML IJ SOLN
INTRAMUSCULAR | Status: DC | PRN
  Administered 2023-07-24: 2 mg via INTRAVENOUS

## 2023-07-24 MED ORDER — ONDANSETRON HCL 4 MG/2ML IJ SOLN
4.0000 mg | Freq: Four times a day (QID) | INTRAMUSCULAR | Status: DC | PRN
  Administered 2023-07-24: 4 mg via INTRAVENOUS

## 2023-07-24 MED ORDER — ACETAMINOPHEN 325 MG PO TABS: 650.0000 mg | ORAL_TABLET | Freq: Four times a day (QID) | ORAL | Status: DC | PRN

## 2023-07-24 MED ORDER — ACETAMINOPHEN 650 MG RE SUPP: 650.0000 mg | Freq: Four times a day (QID) | RECTAL | Status: DC | PRN

## 2023-07-24 MED ORDER — DICLOFENAC SUPPOSITORY 100 MG
RECTAL | Status: AC
  Filled 2023-07-24: qty 1

## 2023-07-24 MED ORDER — MIDAZOLAM HCL 2 MG/2ML IJ SOLN
INTRAMUSCULAR | Status: AC
  Filled 2023-07-24: qty 2

## 2023-07-24 MED ORDER — INSULIN ASPART 100 UNIT/ML IJ SOLN
0.0000 [IU] | INTRAMUSCULAR | Status: DC
  Filled 2023-07-24: qty 1

## 2023-07-24 MED ORDER — PROPOFOL 10 MG/ML IV BOLUS
INTRAVENOUS | Status: DC | PRN
  Administered 2023-07-24: 60 mg via INTRAVENOUS

## 2023-07-24 MED ORDER — GLYCOPYRROLATE 0.2 MG/ML IJ SOLN
INTRAMUSCULAR | Status: DC | PRN
  Administered 2023-07-24: .2 mg via INTRAVENOUS

## 2023-07-24 MED ORDER — ONDANSETRON HCL 4 MG PO TABS: 4.0000 mg | ORAL_TABLET | Freq: Four times a day (QID) | ORAL | Status: DC | PRN

## 2023-07-24 MED ORDER — TRAZODONE HCL 50 MG PO TABS: 25.0000 mg | ORAL_TABLET | Freq: Every evening | ORAL | Status: DC | PRN

## 2023-07-24 MED ORDER — POTASSIUM CHLORIDE IN NACL 20-0.9 MEQ/L-% IV SOLN
INTRAVENOUS | Status: AC
  Filled 2023-07-24 (×4): qty 1000

## 2023-07-24 MED ORDER — HYDROMORPHONE HCL 1 MG/ML IJ SOLN: 0.5000 mg | INTRAMUSCULAR | Status: DC | PRN

## 2023-07-24 MED ORDER — SODIUM CHLORIDE 0.9 % IV SOLN
INTRAVENOUS | Status: DC
  Administered 2023-07-24: 40 mL/h via INTRAVENOUS

## 2023-07-24 MED ORDER — LIDOCAINE HCL (CARDIAC) PF 100 MG/5ML IV SOSY
PREFILLED_SYRINGE | INTRAVENOUS | Status: DC | PRN
  Administered 2023-07-24: 100 mg via INTRAVENOUS

## 2023-07-24 MED ORDER — FENTANYL CITRATE (PF) 100 MCG/2ML IJ SOLN
INTRAMUSCULAR | Status: DC | PRN
  Administered 2023-07-24 (×2): 25 ug via INTRAVENOUS
  Administered 2023-07-24: 50 ug via INTRAVENOUS

## 2023-07-24 MED ORDER — MAGNESIUM HYDROXIDE 400 MG/5ML PO SUSP
30.0000 mL | Freq: Every day | ORAL | Status: DC | PRN
Start: 2023-07-24 — End: 2023-07-25

## 2023-07-24 MED ORDER — POTASSIUM CHLORIDE 20 MEQ PO PACK
40.0000 meq | PACK | Freq: Once | ORAL | Status: AC
  Administered 2023-07-24: 40 meq via ORAL
  Filled 2023-07-24: qty 2

## 2023-07-24 MED ORDER — ENOXAPARIN SODIUM 60 MG/0.6ML IJ SOSY
0.5000 mg/kg | PREFILLED_SYRINGE | INTRAMUSCULAR | Status: DC
Start: 2023-07-24 — End: 2023-07-25
  Administered 2023-07-25: 47.5 mg via SUBCUTANEOUS
  Filled 2023-07-24: qty 0.6

## 2023-07-24 MED ORDER — DICLOFENAC SUPPOSITORY 100 MG
100.0000 mg | Freq: Once | RECTAL | Status: AC
  Administered 2023-07-24: 100 mg via RECTAL
  Filled 2023-07-24: qty 1

## 2023-07-24 MED ORDER — PROPOFOL 500 MG/50ML IV EMUL
INTRAVENOUS | Status: DC | PRN
  Administered 2023-07-24: 150 ug/kg/min via INTRAVENOUS

## 2023-07-24 NOTE — Progress Notes (Signed)
 Anticoagulation monitoring(Lovenox ):  43 yo female ordered Lovenox  40 mg Q24h    There were no vitals filed for this visit.  BMI  35.2   Lab Results  Component Value Date   CREATININE 0.59 07/23/2023   CREATININE 0.66 07/23/2023   CREATININE 0.72 07/22/2023   Estimated Creatinine Clearance: 98.1 mL/min (by C-G formula based on SCr of 0.59 mg/dL). Hemoglobin & Hematocrit     Component Value Date/Time   HGB 10.9 (L) 07/23/2023 1320   HGB 12.2 04/24/2021 0816   HGB 11.5 (L) 12/28/2014 0842   HCT 32.7 (L) 07/23/2023 1320   HCT 37.4 04/24/2021 0816     Per Protocol for Patient with estCrcl > 30 ml/min and BMI > 30, will transition to Lovenox  45 mg Q24h.

## 2023-07-24 NOTE — Anesthesia Preprocedure Evaluation (Signed)
 Anesthesia Evaluation  Patient identified by MRN, date of birth, ID band Patient awake    Reviewed: Allergy & Precautions, NPO status , Patient's Chart, lab work & pertinent test results  Airway Mallampati: II  TM Distance: >3 FB Neck ROM: Full    Dental  (+) Teeth Intact, Missing   Pulmonary neg pulmonary ROS   Pulmonary exam normal breath sounds clear to auscultation       Cardiovascular Exercise Tolerance: Good negative cardio ROS Normal cardiovascular exam Rhythm:Regular Rate:Normal     Neuro/Psych negative neurological ROS  negative psych ROS   GI/Hepatic negative GI ROS, Neg liver ROS,,,  Endo/Other  negative endocrine ROS  Class 3 obesity  Renal/GU negative Renal ROS  negative genitourinary   Musculoskeletal   Abdominal  (+) + obese  Peds negative pediatric ROS (+)  Hematology negative hematology ROS (+) Blood dyscrasia, anemia   Anesthesia Other Findings Past Medical History: No date: Abnormal Pap smear No date: Anemia No date: Anemia No date: Gallbladder problem No date: Pilonidal cyst No date: Prediabetes No date: SOBOE (shortness of breath on exertion) No date: Urinary incontinence No date: Vitamin D  deficiency  Past Surgical History: 1996: EYE SURGERY No date: LEEP     Comment:  CIN II     Reproductive/Obstetrics negative OB ROS                              Anesthesia Physical Anesthesia Plan  ASA: 2  Anesthesia Plan: General   Post-op Pain Management:    Induction: Intravenous  PONV Risk Score and Plan: Propofol  infusion and TIVA  Airway Management Planned: Natural Airway and Nasal Cannula  Additional Equipment:   Intra-op Plan:   Post-operative Plan:   Informed Consent: I have reviewed the patients History and Physical, chart, labs and discussed the procedure including the risks, benefits and alternatives for the proposed anesthesia with the  patient or authorized representative who has indicated his/her understanding and acceptance.     Dental Advisory Given  Plan Discussed with: CRNA  Anesthesia Plan Comments:         Anesthesia Quick Evaluation

## 2023-07-24 NOTE — Consult Note (Signed)
 Rogelia Copping, MD [facility]  [address], [address] [place], [place] [address] Phone: [phone] Fax : [phone]  Consultation  Referring Provider:     Cleotilde Rogue, MD Primary Care Physician:  Shona Norleen PEDLAR, MD Primary Gastroenterologist: [place]         Reason for Consultation:     Bile duct stone  Date of Admission:  07/24/2023 Date of Consultation:  07/24/2023         HPI:   Katie Curtis is a 43 y.o. female with a history of hyperlipidemia diabetes and obesity who had a cholecystectomy on the 15th of this month with abnormal liver enzymes found after the cholecystectomy.  The patient had an MRCP that showed:  IMPRESSION: 1. Choledocholithiasis. Intra and extrahepatic biliary ductal dilatation, common bile duct measuring up to 1.3 cm in caliber, with a 0.9 cm calculus near the ampulla. 2. Status post cholecystectomy.  The patient was at any pen in the emergency department when it was determined that this patient had choledocholithiasis and is in need of an ERCP.  The patient was then transferred over here in the middle the night.  The patient's liver enzymes over the last few days have shown:  Component     Latest Ref Rng 07/21/2023 07/22/2023 07/23/2023 07/24/2023  Total Bilirubin     0.0 - 1.2 mg/dL 1.0  2.2 (H)  3.2 (H)  3.5 (H)   Total Bilirubin        3.3 (H)    AST     15 - 41 U/L 963 (H)  479 (H)  109 (H)  83 (H)   AST        158 (H)    ALT     0 - 44 U/L 681 (H)  753 (H)  467 (H)  382 (H)   ALT        562 (H)      I am now seeing the patient for an ERCP.   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 Authorizing Provider  acetaminophen  (TYLENOL ) 500 MG tablet Take 2 tablets (1,000 mg total) by mouth every 6 (six) hours for 7 days. 07/22/23 07/29/23  Pappayliou, Dorothyann A, DO  amoxicillin -clavulanate (AUGMENTIN ) 500-125 MG tablet Take 1 tablet by mouth 3 (three) times daily for 4 days. 07/22/23 07/26/23  Pappayliou, Dorothyann A, DO  docusate sodium  (COLACE) 100 MG capsule Take 1 capsule (100 mg total) by mouth 2 (two) times daily. 07/22/23 07/21/24  Pappayliou, Dorothyann A, DO  ibuprofen  (ADVIL ) 800 MG tablet Take 1 tablet (800 mg total) by mouth every 8 (eight) hours as needed. Patient taking differently: Take 800 mg by mouth every 8 (eight) hours as needed for mild pain (pain score 1-3). 07/23/23   Pappayliou, Dorothyann A, DO  Insulin  Pen Needle (BD PEN NEEDLE NANO 2ND GEN) 32G X 4 MM MISC Use 1 needle daily to inject medication. 10/02/21   Whitmire, Dawn, FNP  ondansetron  (ZOFRAN )  4 MG tablet Take 1 tablet (4 mg total) by mouth daily as needed for nausea or vomiting (May take as needed with antibiotic or pain medications). 07/22/23 07/21/24  Pappayliou, Dorothyann A, DO  oxyCODONE  (OXY IR/ROXICODONE ) 5 MG immediate release tablet Take 1-2 tablets (5-10 mg total) by mouth every 6 (six) hours as needed for moderate pain (pain score 4-6). 07/22/23   Pappayliou, Dorothyann LABOR, DO    Family History  Problem Relation Age of Onset   Depression Mother    Sleep apnea Mother    Alcoholism Mother    Hypertension Father    Congestive Heart Failure Father        had been on dialysis x 10 years   Diabetes Father    High Cholesterol Father    Heart disease Father    Kidney disease Father    Cancer Father    Obesity Father    Stroke Maternal Grandmother    Lung cancer Paternal Grandmother        smoker   Hypertension Paternal Grandmother    Hypothyroidism Paternal Grandmother    Hypertension Paternal Grandfather    Kidney failure Paternal Grandfather      Social History   Tobacco Use    Smoking status: Never   Smokeless tobacco: Never  Vaping Use   Vaping status: Never Used  Substance Use Topics   Alcohol use: Yes    Alcohol/week: 0.0 - 1.0 standard drinks of alcohol    Comment: socially--1 drink every 2-3 months   Drug use: No    Allergies as of 07/24/2023 - Review Complete 07/24/2023  Allergen Reaction Noted   Penicillins Nausea And Vomiting 04/19/2011   Hydrocodone Nausea Only 04/19/2011   Oxycodone  Nausea Only 04/19/2011    Review of Systems:    All systems reviewed and negative except where noted in HPI.   Physical Exam:  Vital signs in last 24 hours: Temp:  [97.5 F (36.4 C)-99.2 F (37.3 C)] 97.5 F (36.4 C) (07/18 0918) Pulse Rate:  [83-94] 87 (07/18 0918) Resp:  [16-20] 20 (07/18 0918) BP: (145-160)/(81-102) 145/102 (07/18 0918) SpO2:  [94 %-100 %] 99 % (07/18 0918) Weight:  [93 kg] 93 kg (07/17 1305) Last BM Date : 07/22/23 General:   Pleasant, cooperative in NAD Head:  Normocephalic and atraumatic. Eyes:   No icterus.   Conjunctiva pink. PERRLA. Ears:  Normal auditory acuity. Neck:  Supple; no masses or thyroidomegaly Lungs: Respirations even and unlabored. Lungs clear to auscultation bilaterally.   No wheezes, crackles, or rhonchi.  Heart:  Regular rate and rhythm;  Without murmur, clicks, rubs or gallops Abdomen:  Soft, nondistended, nontender. Normal bowel sounds. No appreciable masses or hepatomegaly.  No rebound or guarding.  Rectal:  Not performed. Msk:  Symmetrical without gross deformities.   Extremities:  Without edema, cyanosis or clubbing. Neurologic:  Alert and oriented x3;  grossly normal neurologically. Skin:  Intact without significant lesions or rashes. Cervical Nodes:  No significant cervical adenopathy. Psych:  Alert and cooperative. Normal affect.  LAB RESULTS: Recent Labs    07/22/23 0449 07/23/23 1320 07/24/23 0352  WBC 12.1* 11.4* 11.5*  HGB 10.4* 10.9* 10.1*  HCT 31.8* 32.7* 31.1*  PLT 359 370 336    BMET Recent Labs    07/23/23 1038 07/23/23 1826 07/24/23 0352  NA 137 139 137  K 3.4* 3.3* 3.2*  CL 104 102 103  CO2 24 25 26   GLUCOSE 103* 99 115*  BUN 9 6 7   CREATININE 0.66  0.59 0.42*  CALCIUM 8.8* 8.6* 9.0   LFT Recent Labs    07/24/23 0352  PROT 6.9  ALBUMIN 3.3*  AST 83*  ALT 382*  ALKPHOS 122  BILITOT 3.5*   PT/INR Recent Labs    07/21/23 1001  LABPROT 13.7  INR 1.0    STUDIES: MR ABDOMEN MRCP W WO CONTAST Result Date: 07/23/2023 CLINICAL DATA:  Biliary obstruction suspected, status post cholecystectomy with continued pain EXAM: MRI ABDOMEN WITHOUT AND WITH CONTRAST (INCLUDING MRCP) TECHNIQUE: Multiplanar multisequence MR imaging of the abdomen was performed both before and after the administration of intravenous contrast. Heavily T2-weighted images of the biliary and pancreatic ducts were obtained, and three-dimensional MRCP images were rendered by post processing. CONTRAST:  9mL GADAVIST  GADOBUTROL  1 MMOL/ML IV SOLN COMPARISON:  Right upper quadrant ultrasound, 07/21/2023 FINDINGS: Lower chest: No acute abnormality. Hepatobiliary: No solid liver abnormality is seen. Status post cholecystectomy. Intra and extrahepatic biliary ductal dilatation, common bile duct measuring up to 1.3 cm in caliber, with a 0.9 cm calculus near the ampulla (series 5, image 12). Pancreas: Unremarkable. No pancreatic ductal dilatation or surrounding inflammatory changes. Spleen: Normal in size without significant abnormality. Adrenals/Urinary Tract: Adrenal glands are unremarkable. Kidneys are normal, without renal calculi, solid lesion, or hydronephrosis. Bladder is unremarkable. Stomach/Bowel: Stomach is within normal limits. Appendix appears normal. No evidence of bowel wall thickening, distention, or inflammatory changes. Vascular/Lymphatic: No significant vascular findings are present. No enlarged abdominal or pelvic lymph nodes. Reproductive: No mass or other significant abnormality.  Other: No abdominal wall hernia or abnormality. No ascites. Musculoskeletal: No acute or significant osseous findings. IMPRESSION: 1. Choledocholithiasis. Intra and extrahepatic biliary ductal dilatation, common bile duct measuring up to 1.3 cm in caliber, with a 0.9 cm calculus near the ampulla. 2. Status post cholecystectomy. Electronically Signed   By: Marolyn JONETTA Jaksch M.D.   On: 07/23/2023 15:06   MR 3D Recon At Scanner Result Date: 07/23/2023 CLINICAL DATA:  Biliary obstruction suspected, status post cholecystectomy with continued pain EXAM: MRI ABDOMEN WITHOUT AND WITH CONTRAST (INCLUDING MRCP) TECHNIQUE: Multiplanar multisequence MR imaging of the abdomen was performed both before and after the administration of intravenous contrast. Heavily T2-weighted images of the biliary and pancreatic ducts were obtained, and three-dimensional MRCP images were rendered by post processing. CONTRAST:  9mL GADAVIST  GADOBUTROL  1 MMOL/ML IV SOLN COMPARISON:  Right upper quadrant ultrasound, 07/21/2023 FINDINGS: Lower chest: No acute abnormality. Hepatobiliary: No solid liver abnormality is seen. Status post cholecystectomy. Intra and extrahepatic biliary ductal dilatation, common bile duct measuring up to 1.3 cm in caliber, with a 0.9 cm calculus near the ampulla (series 5, image 12). Pancreas: Unremarkable. No pancreatic ductal dilatation or surrounding inflammatory changes. Spleen: Normal in size without significant abnormality. Adrenals/Urinary Tract: Adrenal glands are unremarkable. Kidneys are normal, without renal calculi, solid lesion, or hydronephrosis. Bladder is unremarkable. Stomach/Bowel: Stomach is within normal limits. Appendix appears normal. No evidence of bowel wall thickening, distention, or inflammatory changes. Vascular/Lymphatic: No significant vascular findings are present. No enlarged abdominal or pelvic lymph nodes. Reproductive: No mass or other significant abnormality. Other: No abdominal wall hernia  or abnormality. No ascites. Musculoskeletal: No acute or significant osseous findings. IMPRESSION: 1. Choledocholithiasis. Intra and extrahepatic biliary ductal dilatation, common bile duct measuring up to 1.3 cm in caliber, with a 0.9 cm calculus near the ampulla. 2. Status post cholecystectomy. Electronically Signed   By: Marolyn JONETTA Jaksch M.D.   On: 07/23/2023 15:06      Impression / Plan:  Assessment: Principal Problem:   Choledocholithiasis   INDIANA GAMERO is a 43 y.o. y/o female with an MRCP showing a common bile duct stone of 9 mm with a common bile duct of 1.3 mm.  The patient has been kept n.p.o. and was transferred from [facility] where ERCP services are not available.  Plan:  The patient will be brought to the endoscopy unit today for an ERCP.  The patient has been explained the procedure including risks benefits and alternatives.  The patient has been told of the risk of pancreatitis, failed procedure, perforation and bleeding.  She has also been explained that pancreatitis if severe enough can result in death.  The patient has been given an opportunity to ask questions and all questions were answered.  The patient agrees to proceeding with the ERCP.  Thank you for involving me in the care of this patient.      LOS: 1 day   Rogelia Copping, MD, MD. NOLIA 07/24/2023, 9:26 AM,  Pager [phone] 7am-5pm  Check AMION for 5pm -7am coverage and on weekends   Note: This dictation was prepared with Dragon dictation along with smaller phrase technology. Any transcriptional errors that result from this process are unintentional.

## 2023-07-24 NOTE — H&P (Signed)
 History and Physical    Patient: Katie Curtis FMW:[id number] DOB: 01-Aug-1980 DOA: 07/24/2023 DOS: the patient was seen and examined on 07/24/2023 PCP: Shona Norleen PEDLAR, MD  Patient coming from: Home  Chief Complaint: No chief complaint on file.  HPI: Katie Curtis is a 43 y.o. female with medical history significant of Diabetes mellitus type 2, hyperlipidemia,  class II obesity,, vitamin D  deficiency, s/p cholecystectomy on 07/21/2023 at [facility]. The patient was told at the time that the surgeon was not able to address the issue of a bile duct obstruction, but that if she developed pain after the surgery and her labs indicated ongoing obstruction, she would need to go to another hospital for an MRCP. The patient states that she continued to have pain after discharge and was found to have elevated LFT's when she returned to [facility] emergency room. She underwent MRCP which indicated CBD dilatation of 1.3 cm with a 0.9cm calculus in the duct. The patient was transferred to [facility] so Dr. Jinny could perform ERCP to remove the stone.  The patient denies fevers, chills, nausea, or vomiting. She has had no melena, hematochezia, hematemesis, or coffee ground emesis. She denies headache, neurological deficits, or confusion. There has been no chest pain, cough, shortness of breath, or wheezing. Review of Systems: As mentioned in the history of present illness. All other systems reviewed and are negative. 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 She has never used smokeless tobacco. She reports current alcohol use. She reports that she does not use drugs.  Allergies  Allergen  Reactions   Penicillins Nausea And Vomiting   Hydrocodone Nausea Only   Oxycodone  Nausea Only    Family History  Problem Relation Age of Onset   Depression Mother    Sleep apnea Mother    Alcoholism Mother    Hypertension Father    Congestive Heart Failure Father        had been on dialysis x 10 years   Diabetes Father    High Cholesterol Father    Heart disease Father    Kidney disease Father    Cancer Father    Obesity Father    Stroke Maternal Grandmother    Lung cancer Paternal Grandmother        smoker   Hypertension Paternal Grandmother    Hypothyroidism Paternal Grandmother    Hypertension Paternal Grandfather    Kidney failure Paternal Grandfather     Prior to Admission medications   Medication Sig Start Date End Date Taking? Authorizing Provider  acetaminophen  (TYLENOL ) 500 MG tablet Take 2 tablets (1,000 mg total) by mouth every 6 (six) hours for 7 days. 07/22/23 07/29/23  Pappayliou, Dorothyann A, DO  amoxicillin -clavulanate (AUGMENTIN ) 500-125 MG tablet Take 1 tablet by mouth 3 (three) times daily for 4 days. 07/22/23 07/26/23  Pappayliou, Dorothyann A, DO  docusate sodium  (COLACE) 100 MG capsule Take 1 capsule (100 mg total) by mouth 2 (two) times daily. 07/22/23 07/21/24  Pappayliou, Dorothyann A, DO  ibuprofen  (ADVIL ) 800 MG  tablet Take 1 tablet (800 mg total) by mouth every 8 (eight) hours as needed. Patient taking differently: Take 800 mg by mouth every 8 (eight) hours as needed for mild pain (pain score 1-3). 07/23/23   Pappayliou, Dorothyann A, DO  Insulin  Pen Needle (BD PEN NEEDLE NANO 2ND GEN) 32G X 4 MM MISC Use 1 needle daily to inject medication. 10/02/21   Whitmire, Dawn, FNP  ondansetron  (ZOFRAN ) 4 MG tablet Take 1 tablet (4 mg total) by mouth daily as needed for nausea or vomiting (May take as needed with antibiotic or pain medications). 07/22/23 07/21/24  Pappayliou, Dorothyann A, DO  oxyCODONE  (OXY IR/ROXICODONE ) 5 MG immediate release tablet Take 1-2 tablets (5-10  mg total) by mouth every 6 (six) hours as needed for moderate pain (pain score 4-6). 07/22/23   PappayliouDorothyann LABOR, DO    Physical Exam: Vitals:   07/24/23 1027 07/24/23 1037 07/24/23 1047 07/24/23 1126  BP: 125/76 (!) 136/90 (!) 140/93 (!) 145/95  Pulse: (!) 108 (!) 104 96 87  Resp: 15 18 17 18   Temp: (!) 97.3 F (36.3 C)   98 F (36.7 C)  TempSrc: Temporal   Oral  SpO2: 100% 98% 100% 100%   Exam:  Constitutional:  The patient is awake, alert, and oriented x 3. No acute distress. Eyes:  pupils and irises appear normal Normal lids and conjunctivae ENMT:  grossly normal hearing  Lips appear normal external ears, nose appear normal Oropharynx: mucosa, tongue,posterior pharynx appear normal Neck:  neck appears normal, no masses, normal ROM, supple no thyromegaly Respiratory:  No increased work of breathing. No wheezes, rales, or rhonchi No tactile fremitus Cardiovascular:  Regular rate and rhythm No murmurs, ectopy, or gallups. No lateral PMI. No thrills. Abdomen:  Abdomen is soft, mildly distended with some epigastric tenderness No hernias, masses, or organomegaly Hypo-active bowel sounds.  Musculoskeletal:  No cyanosis, clubbing, or edema Skin:  No rashes, lesions, ulcers palpation of skin: no induration or nodules Neurologic:  CN 2-12 intact Sensation all 4 extremities intact Psychiatric:  Mental status Mood, affect appropriate Orientation to person, place, time  judgment and insight appear intact  Data Reviewed:  CBC< CMP MRCP  Assessment and Plan: Biliary colic MRCP demonstrates a 1.3 cm dilated common bile duct with 0.9 cm calculus within the duct. Plan is for the patient to undergo ERCP with Dr. Jinny today for removal.  Choledocholithiasis This is the cause for the patient's pain and her elevated LFT's. She will undergo ERCP with Dr. Jinny today for removal of the stone.  Prediabetes Noted. The patient will have a carb controlled diet  following her procedure.  Pure hypercholesterolemia Continue statin as prior to admission following resolution of LFT's after procedure.   Advance Care Planning:   Code Status: Full Code   Consults: Gastroenterology  Family Communication: None present.  Severity of Illness: The appropriate patient status for this patient is INPATIENT. Inpatient status is judged to be reasonable and necessary in order to provide the required intensity of service to ensure the patient's safety. The patient's presenting symptoms, physical exam findings, and initial radiographic and laboratory data in the context of their chronic comorbidities is felt to place them at high risk for further clinical deterioration. Furthermore, it is not anticipated that the patient will be medically stable for discharge from the hospital within 2 midnights of admission.   * I certify that at the point of admission it is my clinical judgment that the patient will require inpatient  hospital care spanning beyond 2 midnights from the point of admission due to high intensity of service, high risk for further deterioration and high frequency of surveillance required.*  Author: Christol Thetford, DO 07/24/2023 11:47 AM  For on call review [email or web address].

## 2023-07-24 NOTE — Assessment & Plan Note (Signed)
 Continue statin as prior to admission following resolution of LFT's after procedure.

## 2023-07-24 NOTE — Plan of Care (Signed)

## 2023-07-24 NOTE — Assessment & Plan Note (Signed)
 MRCP demonstrates a 1.3 cm dilated common bile duct with 0.9 cm calculus within the duct. Plan is for the patient to undergo ERCP with Dr. Jinny today for removal.

## 2023-07-24 NOTE — ED Notes (Signed)
 Transport arranged for ED to ED from [facility] to [facility]

## 2023-07-24 NOTE — Assessment & Plan Note (Signed)
 This is the cause for the patient's pain and her elevated LFT's. She will undergo ERCP with Dr. Jinny today for removal of the stone.

## 2023-07-24 NOTE — Op Note (Signed)
 [facility] Gastroenterology Patient Name: Katie Curtis Procedure Date: 07/24/2023 9:47 AM MRN: [id number] Account #: [id number] Date of Birth: [birth year] Admit Type: Inpatient Age: 43 Room: [facility] ENDO ROOM 4 Gender: Female Note Status: Finalized Instrument Name: [id number] Procedure:             ERCP Indications:           Common bile duct stone(s) Providers:             Rogelia Copping MD, MD Referring MD:          Redell Pinal (Referring MD), Norleen PEDLAR. Shona MD                         (Referring MD) Medicines:             Propofol  per Anesthesia Complications:         No immediate complications. Procedure:             Pre-Anesthesia Assessment:                        - Prior to the procedure, a History and Physical was                         performed, and patient medications and allergies were                         reviewed. The patient's tolerance of previous                         anesthesia was also reviewed. The risks and benefits                         of the procedure and the sedation options and risks                         were discussed with the patient. All questions were                         answered, and informed consent was obtained. Prior                         Anticoagulants: The patient has taken no anticoagulant                         or antiplatelet agents. ASA Grade Assessment: II - A                         patient with mild systemic disease. After reviewing                         the risks and benefits, the patient was deemed in                         satisfactory condition to undergo the procedure.                        After obtaining informed consent, the scope was passed  under direct vision. Throughout the procedure, the                         patient's blood pressure, pulse, and oxygen                         saturations were monitored continuously. The                         Duodenoscope was  introduced through the mouth, and                         used to inject contrast into and used to inject                         contrast into the bile duct. The ERCP was accomplished                         without difficulty. The patient tolerated the                         procedure well. Findings:      The scout film was normal. The esophagus was successfully intubated       under direct vision. The scope was advanced to a normal major papilla in       the descending duodenum without detailed examination of the pharynx,       larynx and associated structures, and upper GI tract. The upper GI tract       was grossly normal. The bile duct was deeply cannulated with the       short-nosed traction sphincterotome. Contrast was injected. I personally       interpreted the bile duct images. There was brisk flow of contrast       through the ducts. Image quality was excellent. Contrast extended to the       entire biliary tree. The middle third of the main bile duct contained       one stone, which was 9 mm in diameter. A wire was passed into the       biliary tree. A 7 mm biliary sphincterotomy was made with a traction       (standard) sphincterotome using ERBE electrocautery. There was self       limited oozing from the sphincterotomy which did not require treatment.       To discover objects, the biliary tree was swept with a 15 mm balloon       starting at the bifurcation. Nothing was found. Major papilla was       successfully dilated with a 10-17-10 mm balloon (to a maximum balloon       size of 12 mm) dilator. The biliary tree was swept with a 15 mm balloon       starting at the bifurcation. Sludge was swept from the duct. All stones       were removed. Nothing was found. Impression:            - Choledocholithiasis was found. Complete removal was                         accomplished by biliary sphincterotomy and balloon  extraction.                        - A  biliary sphincterotomy was performed.                        - The biliary tree was swept and nothing was found.                        - Major papilla was successfully dilated.                        - The biliary tree was swept and nothing was found. Recommendation:        - Return patient to hospital ward for ongoing care.                        - Clear liquid diet.                        - Watch for pancreatitis, bleeding, perforation, and                         cholangitis.                        - If pain free in am then diet may be advanced. Procedure Code(s):     --- Professional ---                        [phone], 59, Endoscopic retrograde                         cholangiopancreatography (ERCP); with trans-endoscopic                         balloon dilation of biliary/pancreatic duct(s) or of                         ampulla (sphincteroplasty), including sphincterotomy,                         when performed, each duct                        43264, Endoscopic retrograde cholangiopancreatography                         (ERCP); with removal of calculi/debris from                         biliary/pancreatic duct(s)                        25671, Endoscopic catheterization of the biliary                         ductal system, radiological supervision and                         interpretation Diagnosis Code(s):     --- Professional ---  K80.50, Calculus of bile duct without cholangitis or                         cholecystitis without obstruction CPT copyright 2022 American Medical Association. All rights reserved. The codes documented in this report are preliminary and upon coder review may  be revised to meet current compliance requirements. Rogelia Copping MD, MD 07/24/2023 10:23:25 AM This report has been signed electronically. Number of Addenda: 0 Note Initiated On: 07/24/2023 9:47 AM Estimated Blood Loss:  Estimated blood loss: none.      [facility]

## 2023-07-24 NOTE — Anesthesia Postprocedure Evaluation (Signed)
 Anesthesia Post Note  Patient: Katie Curtis  Procedure(s) Performed: ERCP, WITH INTERVENTION IF INDICATED  Patient location during evaluation: PACU Anesthesia Type: General Level of consciousness: awake and awake and alert Pain management: satisfactory to patient Vital Signs Assessment: post-procedure vital signs reviewed and stable Respiratory status: spontaneous breathing Cardiovascular status: blood pressure returned to baseline Anesthetic complications: no   No notable events documented.   Last Vitals:  Vitals:   07/24/23 1037 07/24/23 1047  BP: (!) 136/90 (!) 140/93  Pulse: (!) 104 96  Resp: 18 17  Temp:    SpO2: 98% 100%    Last Pain:  Vitals:   07/24/23 1047  TempSrc:   PainSc: 0-No pain                 VAN STAVEREN,Gentle Hoge

## 2023-07-24 NOTE — ED Notes (Signed)
 [facility] arrived

## 2023-07-24 NOTE — Assessment & Plan Note (Signed)
 Noted. The patient will have a carb controlled diet following her procedure.

## 2023-07-24 NOTE — TOC CM/SW Note (Signed)
 Transition of Care [facility]) - Inpatient Brief Assessment   Patient Details  Name: ULA COUVILLON MRN: [id number] Date of Birth: 08-15-1980  Transition of Care [facility]) CM/SW Contact:    Corean ONEIDA Haddock, RN Phone Number: 07/24/2023, 2:54 PM   Clinical Narrative:   Transition of Care [facility]) Screening Note   Patient Details  Name: EILENE VOIGT Date of Birth: [birth year]   Transition of Care [facility]) CM/SW Contact:    Corean ONEIDA Haddock, RN Phone Number: 07/24/2023, 2:54 PM    Transition of Care [facility]) has reviewed patient and no TOC needs have been identified at this time. We will continue to monitor patient advancement through interdisciplinary progression rounds. If new patient transition needs arise, please place a TOC consult.    Transition of Care Asessment: Insurance and Status: Insurance coverage has been reviewed Patient has primary care physician: Yes     Prior/Current Home Services: No current home services Social Drivers of Health Review: SDOH reviewed no interventions necessary Readmission risk has been reviewed: Yes Transition of care needs: no transition of care needs at this time

## 2023-07-24 NOTE — Transfer of Care (Signed)
 Immediate Anesthesia Transfer of Care Note  Patient: Katie Curtis  Procedure(s) Performed: ERCP, WITH INTERVENTION IF INDICATED  Patient Location: Endoscopy Unit  Anesthesia Type:General  Level of Consciousness: drowsy  Airway & Oxygen Therapy: Patient Spontanous Breathing  Post-op Assessment: Report given to RN and Post -op Vital signs reviewed and stable  Post vital signs: Reviewed and stable  Last Vitals:  Vitals Value Taken Time  BP 125/76   Temp    Pulse 108   Resp 12   SpO2 100     Last Pain:  Vitals:   07/24/23 0918  TempSrc: Temporal  PainSc: 4          Complications: No notable events documented.

## 2023-07-25 ENCOUNTER — Encounter: Payer: Self-pay | Admitting: Internal Medicine

## 2023-07-25 DIAGNOSIS — K8050 Calculus of bile duct without cholangitis or cholecystitis without obstruction: Secondary | ICD-10-CM | POA: Diagnosis not present

## 2023-07-25 LAB — CBC
HCT: 29.3 % — ABNORMAL LOW (ref 36.0–46.0)
Hemoglobin: 9.6 g/dL — ABNORMAL LOW (ref 12.0–15.0)
MCH: 28.8 pg (ref 26.0–34.0)
MCHC: 32.8 g/dL (ref 30.0–36.0)
MCV: 88 fL (ref 80.0–100.0)
Platelets: 318 K/uL (ref 150–400)
RBC: 3.33 MIL/uL — ABNORMAL LOW (ref 3.87–5.11)
RDW: 14.1 % (ref 11.5–15.5)
WBC: 9.7 K/uL (ref 4.0–10.5)
nRBC: 0 % (ref 0.0–0.2)

## 2023-07-25 LAB — COMPREHENSIVE METABOLIC PANEL WITH GFR
ALT: 281 U/L — ABNORMAL HIGH (ref 0–44)
AST: 74 U/L — ABNORMAL HIGH (ref 15–41)
Albumin: 2.9 g/dL — ABNORMAL LOW (ref 3.5–5.0)
Alkaline Phosphatase: 107 U/L (ref 38–126)
Anion gap: 5 (ref 5–15)
BUN: <5 mg/dL — ABNORMAL LOW (ref 6–20)
CO2: 26 mmol/L (ref 22–32)
Calcium: 8.6 mg/dL — ABNORMAL LOW (ref 8.9–10.3)
Chloride: 109 mmol/L (ref 98–111)
Creatinine, Ser: 0.69 mg/dL (ref 0.44–1.00)
GFR, Estimated: >60 mL/min (ref >60)
Glucose, Bld: 105 mg/dL — ABNORMAL HIGH (ref 70–99)
Potassium: 3.8 mmol/L (ref 3.5–5.1)
Sodium: 140 mmol/L (ref 135–145)
Total Bilirubin: 1.2 mg/dL (ref 0.0–1.2)
Total Protein: 6 g/dL — ABNORMAL LOW (ref 6.5–8.1)

## 2023-07-25 LAB — GLUCOSE, CAPILLARY
Glucose-Capillary: 107 mg/dL — ABNORMAL HIGH (ref 70–99)
Glucose-Capillary: 104 mg/dL — ABNORMAL HIGH (ref 70–99)
Glucose-Capillary: 111 mg/dL — ABNORMAL HIGH (ref 70–99)

## 2023-07-25 NOTE — Discharge Summary (Signed)
 Physician Discharge Summary   Patient: Katie Curtis MRN: [id number] DOB: 1980/11/23  Admit date:     07/24/2023  Discharge date: 07/25/23  Discharge Physician: Delon Herald   PCP: Shona Norleen PEDLAR, MD   Recommendations at discharge:   Resume post-operative care as previously instructed Complete antibiotics as prescribed Pain control with Tylenol , ibuprofen , oxycodone  Advance diet from soft to regular as tolerated Follow up next week with Dr. Shona; will need repeat lab work  Follow up with Dr. Evonnie in 2 weeks, as advised  Discharge Diagnoses: Principal Problem:   Choledocholithiasis Active Problems:   Prediabetes   Biliary colic   Pure hypercholesterolemia    Hospital Course: 43yo with h/o DM, HTN, HLD, and class 2 obesity who was last hospitalized at [facility] and is s/p cholecystectomy on 7/15.  The surgeon was unable to address bile duct obstruction and she was informed that she would need MRCP if there was concern for ongoing obstruction.  Pain persisted with elevated LFTs and MRCP was performed showing CBD dilatation with a calculus in the duct so she was transferred to [facility] for ERCP.  ERCP on 7/18 with choledocholithiasis s/p removal by biliary sphincterotomy and balloon extraction.    Assessment and Plan:  Biliary colic from choledocholithiasis S/p lap chole on 7/15 Post-operatively she was noted to have mildly increased WBC count and LFT elevation Post-hospitalization, she had pain and further worsening of labs MRCP performed and showed a 1.3 cm dilated common bile duct with 0.9 cm calculus within the duct She was transferred from [facility] to [facility] for ERCP with Dr. Jinny  Successful stone retrieval via ERCP on 7/18 Doing well post-operatively and wants to go home LFTs are improving but will need to be followed to normal Follow up with Dr. Evonnie in 2 weeks, as advised   Prediabetes A1c 5.3 Carb modified diet    Pure hypercholesterolemia Continue statin    Elevated BP BP up to 152/97 Encourage outpatient monitoring, as she may need medication  Class 2 obesity BMI 35.19 Weight loss should be encouraged Outpatient PCP/bariatric medicine f/u encouraged Significantly low or high BMI is associated with higher medical risk including morbidity and mortality        Consultants: GI   Procedures: ERCP 7/18   Antibiotics: None  Pain control - [place]  Controlled Substance Reporting System database was reviewed. and patient was instructed, not to drive, operate heavy machinery, perform activities at heights, swimming or participation in water  activities or provide baby-sitting services while on Pain, Sleep and Anxiety Medications; until their outpatient Physician has advised to do so again. Also recommended to not to take more than prescribed Pain, Sleep and Anxiety Medications.   Disposition: Home Diet recommendation:  Regular diet DISCHARGE MEDICATION: Allergies as of 07/25/2023       Reactions   Penicillins Nausea And Vomiting   Hydrocodone Nausea Only   Oxycodone  Nausea Only        Medication List     TAKE these medications    acetaminophen  500 MG tablet Commonly known as: TYLENOL  Take 2 tablets (1,000 mg total) by mouth every 6 (six) hours for 7 days.   amoxicillin -clavulanate 500-125 MG tablet Commonly known as: Augmentin  Take 1 tablet by mouth 3 (three) times daily for 4 days.   BD Pen Needle Nano 2nd Gen 32G X 4 MM Misc Generic drug: Insulin  Pen Needle Use 1 needle daily to inject medication.   docusate sodium  100 MG capsule Commonly known as: Colace Take 1  capsule (100 mg total) by mouth 2 (two) times daily.   ibuprofen  800 MG tablet Commonly known as: ADVIL  Take 1 tablet (800 mg total) by mouth every 8 (eight) hours as needed. What changed: reasons to take this   ondansetron  4 MG tablet Commonly known as: Zofran  Take 1 tablet (4 mg total) by mouth daily as needed for nausea or vomiting (May take  as needed with antibiotic or pain medications).   oxyCODONE  5 MG immediate release tablet Commonly known as: Oxy IR/ROXICODONE  Take 1-2 tablets (5-10 mg total) by mouth every 6 (six) hours as needed for moderate pain (pain score 4-6).        Discharge Exam:   Subjective: Feeling well post-procedure.  Mild post-operative pain as anticipated but not even requiring pain medication.  Tolerating soft diet.  Wants to go home.   Objective: Vitals:   07/24/23 1959 07/25/23 0427  BP: (!) 140/90 139/87  Pulse: 80 77  Resp: 20 16  Temp: 98.3 F (36.8 C) 98.3 F (36.8 C)  SpO2: 100% 98%    Intake/Output Summary (Last 24 hours) at 07/25/2023 1239 Last data filed at 07/25/2023 1100 Gross per 24 hour  Intake 3083.57 ml  Output --  Net 3083.57 ml   There were no vitals filed for this visit.  Exam:  General:  Appears calm and comfortable and is in NAD Eyes:  normal lids, iris ENT:  grossly normal hearing, lips & tongue, mmm Cardiovascular:  RRR. No LE edema.  Respiratory:   CTA bilaterally with no wheezes/rales/rhonchi.  Normal respiratory effort. Abdomen:  soft, appropriately tender Skin:  no rash or induration seen on limited exam Musculoskeletal:  grossly normal tone BUE/BLE, good ROM, no bony abnormality Psychiatric:  grossly normal mood and affect, speech fluent and appropriate, AOx3 Neurologic:  CN 2-12 grossly intact, moves all extremities in coordinated fashion  Data Reviewed: I have reviewed the patient's lab results since admission.  Pertinent labs for today include:  Glucose 105 Albumin 2.9 AST 74/ALT 281/Bili 1.2; improved from 83/382/3.5 WBC 9.7 Hgb 9.6     Condition at discharge: improving  The results of significant diagnostics from this hospitalization (including imaging, microbiology, ancillary and laboratory) are listed below for reference.   Imaging Studies: DG C-Arm 1-60 Min-No Report Result Date: 07/24/2023 Fluoroscopy was utilized by the  requesting physician.  No radiographic interpretation.   MR ABDOMEN MRCP W WO CONTAST Result Date: 07/23/2023 CLINICAL DATA:  Biliary obstruction suspected, status post cholecystectomy with continued pain EXAM: MRI ABDOMEN WITHOUT AND WITH CONTRAST (INCLUDING MRCP) TECHNIQUE: Multiplanar multisequence MR imaging of the abdomen was performed both before and after the administration of intravenous contrast. Heavily T2-weighted images of the biliary and pancreatic ducts were obtained, and three-dimensional MRCP images were rendered by post processing. CONTRAST:  9mL GADAVIST  GADOBUTROL  1 MMOL/ML IV SOLN COMPARISON:  Right upper quadrant ultrasound, 07/21/2023 FINDINGS: Lower chest: No acute abnormality. Hepatobiliary: No solid liver abnormality is seen. Status post cholecystectomy. Intra and extrahepatic biliary ductal dilatation, common bile duct measuring up to 1.3 cm in caliber, with a 0.9 cm calculus near the ampulla (series 5, image 12). Pancreas: Unremarkable. No pancreatic ductal dilatation or surrounding inflammatory changes. Spleen: Normal in size without significant abnormality. Adrenals/Urinary Tract: Adrenal glands are unremarkable. Kidneys are normal, without renal calculi, solid lesion, or hydronephrosis. Bladder is unremarkable. Stomach/Bowel: Stomach is within normal limits. Appendix appears normal. No evidence of bowel wall thickening, distention, or inflammatory changes. Vascular/Lymphatic: No significant vascular findings are present. No enlarged  abdominal or pelvic lymph nodes. Reproductive: No mass or other significant abnormality. Other: No abdominal wall hernia or abnormality. No ascites. Musculoskeletal: No acute or significant osseous findings. IMPRESSION: 1. Choledocholithiasis. Intra and extrahepatic biliary ductal dilatation, common bile duct measuring up to 1.3 cm in caliber, with a 0.9 cm calculus near the ampulla. 2. Status post cholecystectomy. Electronically Signed   By: Marolyn JONETTA Jaksch M.D.   On: 07/23/2023 15:06   MR 3D Recon At Scanner Result Date: 07/23/2023 CLINICAL DATA:  Biliary obstruction suspected, status post cholecystectomy with continued pain EXAM: MRI ABDOMEN WITHOUT AND WITH CONTRAST (INCLUDING MRCP) TECHNIQUE: Multiplanar multisequence MR imaging of the abdomen was performed both before and after the administration of intravenous contrast. Heavily T2-weighted images of the biliary and pancreatic ducts were obtained, and three-dimensional MRCP images were rendered by post processing. CONTRAST:  9mL GADAVIST  GADOBUTROL  1 MMOL/ML IV SOLN COMPARISON:  Right upper quadrant ultrasound, 07/21/2023 FINDINGS: Lower chest: No acute abnormality. Hepatobiliary: No solid liver abnormality is seen. Status post cholecystectomy. Intra and extrahepatic biliary ductal dilatation, common bile duct measuring up to 1.3 cm in caliber, with a 0.9 cm calculus near the ampulla (series 5, image 12). Pancreas: Unremarkable. No pancreatic ductal dilatation or surrounding inflammatory changes. Spleen: Normal in size without significant abnormality. Adrenals/Urinary Tract: Adrenal glands are unremarkable. Kidneys are normal, without renal calculi, solid lesion, or hydronephrosis. Bladder is unremarkable. Stomach/Bowel: Stomach is within normal limits. Appendix appears normal. No evidence of bowel wall thickening, distention, or inflammatory changes. Vascular/Lymphatic: No significant vascular findings are present. No enlarged abdominal or pelvic lymph nodes. Reproductive: No mass or other significant abnormality. Other: No abdominal wall hernia or abnormality. No ascites. Musculoskeletal: No acute or significant osseous findings. IMPRESSION: 1. Choledocholithiasis. Intra and extrahepatic biliary ductal dilatation, common bile duct measuring up to 1.3 cm in caliber, with a 0.9 cm calculus near the ampulla. 2. Status post cholecystectomy. Electronically Signed   By: Marolyn JONETTA Jaksch M.D.   On: 07/23/2023  15:06   US  Abdomen Limited RUQ (LIVER/GB) Result Date: 07/21/2023 CLINICAL DATA:  Right upper quadrant pain for 12 hours EXAM: ULTRASOUND ABDOMEN LIMITED RIGHT UPPER QUADRANT COMPARISON:  Ultrasound 10/28/2020 FINDINGS: Gallbladder: Dilated gallbladder. Multiple shadowing stones. Stones also towards the neck. Slight gallbladder wall thickening of 4 mm. No adjacent fluid. The sonographer does report pain when scanning the gallbladder. Common bile duct: Diameter: Enlarged at 10 mm. However previously was measured at 9 mm in 2022. some ectasia of the intrahepatic biliary tree Liver: No focal lesion identified. Within normal limits in parenchymal echogenicity. Portal vein is patent on color Doppler imaging with normal direction of blood flow towards the liver. Other: Some limitation with overlapping bowel gas and soft tissue. IMPRESSION: Dilated gallbladder with stones including a stone towards the neck. There is also a reported Murphy's sign and mild gallbladder wall thickening. Please correlate with clinical findings of acute cholecystitis and if needed confirmatory HIDA scan when clinically appropriate. Ectatic common duct but was seen previously. Please correlate for known history otherwise confirmatory study could be performed as well when appropriate. Electronically Signed   By: Ranell Bring M.D.   On: 07/21/2023 09:55    Microbiology: Results for orders placed or performed in visit on 12/10/20  Urine Culture     Status: None   Collection Time: 12/10/20  2:23 PM   Specimen: Urine  Result Value Ref Range Status   MICRO NUMBER: [id number]  Final   SPECIMEN QUALITY: Adequate  Final   Sample  Source NOT GIVEN  Final   STATUS: FINAL  Final   ISOLATE 1:   Final    Mixed genital flora isolated. These superficial bacteria are not indicative of a urinary tract infection. No further organism identification is warranted on this specimen. If clinically indicated, recollect clean-catch, mid-stream urine and  transfer  immediately to Urine Culture Transport Tube.     Labs: CBC: Recent Labs  Lab 07/21/23 0741 07/22/23 0449 07/23/23 1320 07/24/23 0352 07/25/23 0350  WBC 8.7 12.1* 11.4* 11.5* 9.7  NEUTROABS  --   --  8.8*  --   --   HGB 11.2* 10.4* 10.9* 10.1* 9.6*  HCT 34.5* 31.8* 32.7* 31.1* 29.3*  MCV 89.4 88.3 88.6 87.9 88.0  PLT 373 359 370 336 318   Basic Metabolic Panel: Recent Labs  Lab 07/22/23 0449 07/23/23 1038 07/23/23 1826 07/24/23 0352 07/25/23 0350  NA 135 137 139 137 140  K 3.7 3.4* 3.3* 3.2* 3.8  CL 104 104 102 103 109  CO2 23 24 25 26 26   GLUCOSE 182* 103* 99 115* 105*  BUN 9 9 6 7  <5*  CREATININE 0.72 0.66 0.59 0.42* 0.69  CALCIUM 9.0 8.8* 8.6* 9.0 8.6*   Liver Function Tests: Recent Labs  Lab 07/22/23 0449 07/23/23 1038 07/23/23 1826 07/24/23 0352 07/25/23 0350  AST 479* 158* 109* 83* 74*  ALT 753* 562* 467* 382* 281*  ALKPHOS 115 128* 121 122 107  BILITOT 2.2* 3.3* 3.2* 3.5* 1.2  PROT 6.9 7.2 7.0 6.9 6.0*  ALBUMIN 3.2* 3.4* 3.3* 3.3* 2.9*   CBG: Recent Labs  Lab 07/24/23 2000 07/24/23 2333 07/25/23 0439 07/25/23 0729 07/25/23 1153  GLUCAP 150* 94 107* 104* 111*    Discharge time spent: greater than 30 minutes.  Signed: Delon Herald, MD [facility] 07/25/2023

## 2023-07-25 NOTE — Hospital Course (Signed)
 43yo with h/o DM, HTN, HLD, and class 2 obesity who was last hospitalized at [facility] and is s/p cholecystectomy on 7/15.  The surgeon was unable to address bile duct obstruction and she was informed that she would need MRCP if there was concern for ongoing obstruction.  Pain persisted with elevated LFTs and MRCP was performed showing CBD dilatation with a calculus in the duct so she was transferred to [facility] for ERCP.  ERCP on 7/18 with choledocholithiasis s/p removal by biliary sphincterotomy and balloon extraction.

## 2023-07-26 ENCOUNTER — Encounter: Payer: Self-pay | Admitting: Gastroenterology

## 2023-08-06 ENCOUNTER — Ambulatory Visit: Admitting: Surgery

## 2023-08-06 ENCOUNTER — Encounter: Payer: Self-pay | Admitting: Surgery

## 2023-08-06 VITALS — BP 132/88 | HR 92 | Temp 98.3°F | Resp 16 | Ht 64.0 in | Wt 204.0 lb

## 2023-08-06 DIAGNOSIS — Z09 Encounter for follow-up examination after completed treatment for conditions other than malignant neoplasm: Secondary | ICD-10-CM

## 2023-08-07 NOTE — Progress Notes (Signed)
 [facility] Note   HPI:  43 y.o. Female presents to clinic for post-op follow-up status post robotic assisted laparoscopic cholecystectomy on 7/15 and subsequent ERCP on 7/18 for choledocholithiasis.  Patient has overall been doing well since leaving the hospital.  She still has a little bit of pain occasionally in the right side of her abdomen, but it is significantly better than the pain that she had prior to surgery.  She is tolerating a diet without nausea and vomiting, moving her bowels without issue.  She denies fevers and chills.  She denies any issues at her incision sites.  Review of Systems:  All other review of systems: otherwise negative   Vital Signs:  BP 132/88   Pulse 92   Temp 98.3 F (36.8 C) (Oral)   Resp 16   Ht 5' 4 (1.626 m)   Wt 204 lb (92.5 kg)   LMP 07/16/2023 (Approximate) Comment: Urine Pregnancy Test Negative as of 07/21/23.  SpO2 96%   BMI 35.02 kg/m    Physical Exam:  Physical Exam Vitals reviewed.  Constitutional:      Appearance: Normal appearance.  Abdominal:     Comments: Abdomen soft, nondistended, no percussion tenderness, nontender to palpation; no rigidity, guarding, rebound tenderness; laparoscopic incision sites healing well with a small amount of skin glue in place  Neurological:     Mental Status: She is alert.     Laboratory studies:     Latest Ref Rng & Units 07/25/2023    3:50 AM 07/24/2023    3:52 AM 07/23/2023    6:26 PM  CMP  Glucose 70 - 99 mg/dL 894  884  99   BUN 6 - 20 mg/dL 5  7  6    Creatinine 0.44 - 1.00 mg/dL 9.30  9.57  9.40   Sodium 135 - 145 mmol/L 140  137  139   Potassium 3.5 - 5.1 mmol/L 3.8  3.2  3.3   Chloride 98 - 111 mmol/L 109  103  102   CO2 22 - 32 mmol/L 26  26  25    Calcium 8.9 - 10.3 mg/dL 8.6  9.0  8.6   Total Protein 6.5 - 8.1 g/dL 6.0  6.9  7.0   Total Bilirubin 0.0 - 1.2 mg/dL 1.2  3.5  3.2   Alkaline Phos 38 - 126 U/L 107  122  121   AST 15 - 41 U/L 74  83  109   ALT 0 - 44  U/L 281  382  467      Imaging:  None  Pathology: A. GALLBLADDER, CHOLECYSTECTOMY:  Acute cholecystitis with mucosal erosion.  Cholelithiasis.   Assessment:  43 y.o. yo Female who presents for follow-up status post robotic assisted laparoscopic cholecystectomy on 7/15 and subsequent ERCP on 7/18 for choledocholithiasis  Plan:  - Overall the patient has been doing well since surgery and ERCP.  Tolerating a diet, moving her bowels, and pain well-controlled - We discussed her most recent blood work, which demonstrates resolution in her bilirubinemia.  She has mild elevation in some of her LFTs, but this can sometimes take a week or so to return to normal - Advised that she can use antibiotic ointment at her incision sites to get the remaining glue off - Follow up with me as needed  All of the above recommendations were discussed with the patient, and all of patient's questions were answered to her expressed satisfaction.  Note: Portions of this report may have been  transcribed using voice recognition software. Every effort has been made to ensure accuracy; however, inadvertent computerized transcription errors may still be present.   [name], DO [facility] [address] [place], [place] [id number] [phone] (office)

## 2023-08-19 ENCOUNTER — Other Ambulatory Visit (HOSPITAL_COMMUNITY): Payer: Self-pay | Admitting: Internal Medicine

## 2023-08-19 DIAGNOSIS — Z1231 Encounter for screening mammogram for malignant neoplasm of breast: Secondary | ICD-10-CM

## 2023-09-14 ENCOUNTER — Ambulatory Visit (HOSPITAL_COMMUNITY)
Admission: RE | Admit: 2023-09-14 | Discharge: 2023-09-14 | Disposition: A | Discharge status: Home / Self Care | Source: Ambulatory Visit | Attending: Internal Medicine | Admitting: Internal Medicine

## 2023-09-14 ENCOUNTER — Encounter (HOSPITAL_COMMUNITY): Payer: Self-pay

## 2023-09-14 DIAGNOSIS — Z1231 Encounter for screening mammogram for malignant neoplasm of breast: Secondary | ICD-10-CM | POA: Insufficient documentation

## 2023-11-02 ENCOUNTER — Encounter: Payer: Self-pay | Admitting: Obstetrics and Gynecology

## 2023-12-10 ENCOUNTER — Encounter: Payer: Self-pay | Admitting: Gastroenterology

## 2024-02-01 ENCOUNTER — Ambulatory Visit: Admitting: Gastroenterology

## 2024-02-29 ENCOUNTER — Ambulatory Visit: Admitting: Gastroenterology
